# Patient Record
Sex: Female | Born: 1950 | Race: White | Hispanic: No | Marital: Single | Smoking: Current every day smoker
Health system: Southern US, Community
[De-identification: ages and names within clinical notes are randomized; demographics above are authoritative.]

## PROBLEM LIST (undated history)

## (undated) DIAGNOSIS — I10 Essential (primary) hypertension: Secondary | ICD-10-CM

## (undated) DIAGNOSIS — Z923 Personal history of irradiation: Secondary | ICD-10-CM

## (undated) DIAGNOSIS — M1990 Unspecified osteoarthritis, unspecified site: Secondary | ICD-10-CM

## (undated) HISTORY — PX: HEART CHAMBER REVISION: SHX267

## (undated) HISTORY — DX: Personal history of irradiation: Z92.3

---

## 2014-05-18 ENCOUNTER — Encounter (HOSPITAL_COMMUNITY): Payer: Self-pay | Admitting: Emergency Medicine

## 2014-05-18 ENCOUNTER — Emergency Department (HOSPITAL_COMMUNITY)
Admission: EM | Admit: 2014-05-18 | Discharge: 2014-05-18 | Disposition: A | Discharge status: Home / Self Care | Payer: Worker's Compensation | Attending: Emergency Medicine | Admitting: Emergency Medicine

## 2014-05-18 DIAGNOSIS — Y990 Civilian activity done for income or pay: Secondary | ICD-10-CM | POA: Insufficient documentation

## 2014-05-18 DIAGNOSIS — Y9289 Other specified places as the place of occurrence of the external cause: Secondary | ICD-10-CM | POA: Insufficient documentation

## 2014-05-18 DIAGNOSIS — S0083XA Contusion of other part of head, initial encounter: Secondary | ICD-10-CM | POA: Insufficient documentation

## 2014-05-18 DIAGNOSIS — S0990XA Unspecified injury of head, initial encounter: Secondary | ICD-10-CM | POA: Diagnosis present

## 2014-05-18 DIAGNOSIS — Z720 Tobacco use: Secondary | ICD-10-CM | POA: Insufficient documentation

## 2014-05-18 DIAGNOSIS — W228XXA Striking against or struck by other objects, initial encounter: Secondary | ICD-10-CM | POA: Diagnosis not present

## 2014-05-18 DIAGNOSIS — Y9389 Activity, other specified: Secondary | ICD-10-CM | POA: Diagnosis not present

## 2014-05-18 NOTE — ED Provider Notes (Signed)
CSN: [id number]     Arrival date & time 05/18/14  1239 History  This chart was scribed for non-physician practitioner, Domenic Moras, PA-C working with Ezequiel Essex, MD by Frederich Balding, ED scribe. This patient was seen in room TR06C/TR06C and the patient's care was started at 1:24 PM.   Chief Complaint  Patient presents with  . Head Injury   The history is provided by the patient. No language interpreter was used.    HPI Comments: Cheryl Singh is a 63 y.o. female who presents to the Emergency Department complaining of head injury that occurred today around 10 AM. States she was working with fences when a piece hit her forehead. Denies LOC or fall. Reports sudden onset pain at the time that is now resolved. Denies nausea, neck pain, dizziness, confusion. Pt is not currently on blood thinners.    History reviewed. No pertinent past medical history. History reviewed. No pertinent past surgical history. History reviewed. No pertinent family history. History  Substance Use Topics  . Smoking status: Current Every Day Smoker  . Smokeless tobacco: Not on file  . Alcohol Use: Yes     Comment: occ   OB History    No data available     Review of Systems  Gastrointestinal: Negative for nausea.  Musculoskeletal: Negative for neck pain.  Neurological: Negative for dizziness and syncope.  Psychiatric/Behavioral: Negative for confusion.  All other systems reviewed and are negative.  Allergies  Review of patient's allergies indicates no known allergies.  Home Medications   Prior to Admission medications   Not on File   BP 170/89 mmHg  Pulse 100  Temp(Src) 98.2 F (36.8 C) (Oral)  Resp 20  Ht 5' 6.5" (1.689 m)  Wt 160 lb (72.576 kg)  BMI 25.44 kg/m2  SpO2 97%   Physical Exam  Constitutional: She is oriented to person, place, and time. She appears well-developed and well-nourished. No distress.  HENT:  Head: Normocephalic and atraumatic.  Right Ear: Tympanic membrane and  ear canal normal. No hemotympanum.  Left Ear: Tympanic membrane and ear canal normal.  Right forehead with a excised hematoma noted. Minimal tenderness. No crepitus. Right TM with no hemotympanum. Left TM with cerumen impaction but no obvious bleeding.   Eyes: Conjunctivae and EOM are normal.  Neck: Neck supple. No tracheal deviation present.  No cervical spine tenderness  Cardiovascular: Normal rate.   Pulmonary/Chest: Effort normal. No respiratory distress.  Musculoskeletal: Normal range of motion.  Neurological: She is alert and oriented to person, place, and time. GCS eye subscore is 4. GCS verbal subscore is 5. GCS motor subscore is 6.  Neurologic exam:  Speech clear, pupils equal round reactive to light, extraocular movements intact  Normal peripheral visual fields Cranial nerves III through XII normal including no facial droop Follows commands, moves all extremities x4, normal strength to bilateral upper and lower extremities at all major muscle groups including grip Sensation normal to light touch  Coordination intact, no limb ataxia, finger-nose-finger normal Rapid alternating movements normal No pronator drift Gait normal   Skin: Skin is warm and dry.  Psychiatric: She has a normal mood and affect. Her behavior is normal.  Nursing note and vitals reviewed.   ED Course  Procedures (including critical care time)  DIAGNOSTIC STUDIES: Oxygen Saturation is 97% on RA, normal by my interpretation.    COORDINATION OF CARE: 1:29 PM-Head CT offered and pt declined. Discussed treatment plan which includes ice and ibuprofen or tylenol for pain with  pt at bedside and pt agreed to plan. Return precautions given. Pt currently not on anticoagulant.  Pt mentating appropriately, no acute distress  Labs Review Labs Reviewed - No data to display  Imaging Review No results found.   EKG Interpretation None      MDM   Final diagnoses:  Forehead contusion, initial encounter     BP 170/89 mmHg  Pulse 100  Temp(Src) 98.2 F (36.8 C) (Oral)  Resp 20  Ht 5' 6.5" (1.689 m)  Wt 160 lb (72.576 kg)  BMI 25.44 kg/m2  SpO2 97%   I personally performed the services described in this documentation, which was scribed in my presence. The recorded information has been reviewed and is accurate.  Domenic Moras, PA-C 05/18/14 [place], MD 05/18/14 [phone]

## 2014-05-18 NOTE — Discharge Instructions (Signed)
Please apply cool compress to your forehead for 15-20 min each several times daily.  Take ibuprofen or tylenol for pain.  Promptly return if you develop severe headache, numbness, vision changes, weakness, confusion or if you have other concerns.     Contusion A contusion is the result of an injury to the skin and underlying tissues and is usually caused by direct trauma. The injury results in the appearance of a bruise on the skin overlying the injured tissues. Contusions cause rupture and bleeding of the small capillaries and blood vessels and affect function, because the bleeding infiltrates muscles, tendons, nerves, or other soft tissues.  SYMPTOMS   Swelling and often a hard lump in the injured area, either superficial or deep.  Pain and tenderness over the area of the contusion.  Feeling of firmness when pressure is exerted over the contusion.  Discoloration under the skin, beginning with redness and progressing to the characteristic "black and blue" bruise. CAUSES  A contusion is typically the result of direct trauma. This is often by a blunt object.  RISK INCREASES WITH:  Sports that have a high likelihood of trauma (football, boxing, ice hockey, soccer, field hockey, martial arts, basketball, and baseball).  Sports that make falling from a height likely (high-jumping, pole-vaulting, skating, or gymnastics).  Any bleeding disorder (hemophilia) or taking medications that affect clotting (aspirin, nonsteroidal anti-inflammatory medications, or warfarin [Coumadin]).  Inadequate protection of exposed areas during contact sports. PREVENTION  Maintain physical fitness:  Joint and muscle flexibility.  Strength and endurance.  Coordination.  Wear proper protective equipment. Make sure it fits correctly. PROGNOSIS  Contusions typically heal without any complications. Healing time varies with the severity of injury and intake of medications that affect clotting. Contusions usually  heal in 1 to 4 weeks. RELATED COMPLICATIONS   Damage to nearby nerves or blood vessels, causing numbness, coldness, or paleness.  Compartment syndrome.  Bleeding into the soft tissues that leads to disability.  Infiltrative-type bleeding, leading to the calcification and impaired function of the injured muscle (rare).  Prolonged healing time if usual activities are resumed too soon.  Infection if the skin over the injury site is broken.  Fracture of the bone underlying the contusion.  Stiffness in the joint where the injured muscle crosses. TREATMENT  Treatment initially consists of resting the injured area as well as medication and ice to reduce inflammation. The use of a compression bandage may also be helpful in minimizing inflammation. As pain diminishes and movement is tolerated, the joint where the affected muscle crosses should be moved to prevent stiffness and the shortening (contracture) of the joint. Movement of the joint should begin as soon as possible. It is also important to work on maintaining strength within the affected muscles. Occasionally, extra padding over the area of contusion may be recommended before returning to sports, particularly if re-injury is likely.  MEDICATION   If pain relief is necessary these medications are often recommended:  Nonsteroidal anti-inflammatory medications, such as aspirin and ibuprofen.  Other minor pain relievers, such as acetaminophen, are often recommended.  Prescription pain relievers may be given by your caregiver. Use only as directed and only as much as you need. HEAT AND COLD  Cold treatment (icing) relieves pain and reduces inflammation. Cold treatment should be applied for 10 to 15 minutes every 2 to 3 hours for inflammation and pain and immediately after any activity that aggravates your symptoms. Use ice packs or an ice massage. (To do an ice massage fill  a large styrofoam cup with water and freeze. Tear a small amount of  foam from the top so ice protrudes. Massage ice firmly over the injured area in a circle about the size of a softball.)  Heat treatment may be used prior to performing the stretching and strengthening activities prescribed by your caregiver, physical therapist, or athletic trainer. Use a heat pack or a warm soak. SEEK MEDICAL CARE IF:   Symptoms get worse or do not improve despite treatment in a few days.  You have difficulty moving a joint.  Any extremity becomes extremely painful, numb, pale, or cool (This is an emergency!).  Medication produces any side effects (bleeding, upset stomach, or allergic reaction).  Signs of infection (drainage from skin, headache, muscle aches, dizziness, fever, or general ill feeling) occur if skin was broken. Document Released: 06/17/2005 Document Revised: 09/09/2011 Document Reviewed: 09/29/2008 [facility] Patient Information 2015 [place], [place]. This information is not intended to replace advice given to you by your health care provider. Make sure you discuss any questions you have with your health care provider.

## 2014-05-18 NOTE — ED Notes (Signed)
Pt sts hit with something while at work today in head; bruising noted; pt denies pain or LOC

## 2019-08-10 ENCOUNTER — Ambulatory Visit: Payer: Medicare PPO | Attending: Internal Medicine

## 2019-08-10 DIAGNOSIS — Z23 Encounter for immunization: Secondary | ICD-10-CM

## 2019-08-10 NOTE — Progress Notes (Signed)
   Covid-19 Vaccination Clinic  Name:  ANELIZ ESSLINGER    MRN: [id number] DOB: [birth year]  08/10/2019  Ms. Wonders was observed post Covid-19 immunization for 15 minutes without incidence. She was provided with Vaccine Information Sheet and instruction to access the V-Safe system.   Ms. Mastrogiovanni was instructed to call 911 with any severe reactions post vaccine: Marland Kitchen Difficulty breathing  . Swelling of your face and throat  . A fast heartbeat  . A bad rash all over your body  . Dizziness and weakness    Immunizations Administered    Name Date Dose VIS Date Route   Pfizer COVID-19 Vaccine 08/10/2019  2:43 PM 0.3 mL 06/11/2019 Intramuscular   Manufacturer: [place]   Lot: [id number]   [place]: [id number]

## 2019-08-12 ENCOUNTER — Ambulatory Visit: Payer: Self-pay

## 2019-09-04 ENCOUNTER — Ambulatory Visit: Payer: Medicare PPO | Attending: Internal Medicine

## 2019-09-04 DIAGNOSIS — Z23 Encounter for immunization: Secondary | ICD-10-CM | POA: Insufficient documentation

## 2019-09-04 NOTE — Progress Notes (Signed)
   Covid-19 Vaccination Clinic  Name:  JANAZIA TITTERINGTON    MRN: [id number] DOB: [birth year]  09/04/2019  Ms. Borras was observed post Covid-19 immunization for 15 minutes without incident. She was provided with Vaccine Information Sheet and instruction to access the V-Safe system.   Ms. Blough was instructed to call 911 with any severe reactions post vaccine: Marland Kitchen Difficulty breathing  . Swelling of face and throat  . A fast heartbeat  . A bad rash all over body  . Dizziness and weakness   Immunizations Administered    Name Date Dose VIS Date Route   Pfizer COVID-19 Vaccine 09/04/2019  2:35 PM 0.3 mL 06/11/2019 Intramuscular   Manufacturer: [place]   Lot: [id number]   [place]: [id number]

## 2020-09-26 DIAGNOSIS — Z01812 Encounter for preprocedural laboratory examination: Secondary | ICD-10-CM | POA: Diagnosis not present

## 2020-09-28 DIAGNOSIS — K5730 Diverticulosis of large intestine without perforation or abscess without bleeding: Secondary | ICD-10-CM | POA: Diagnosis not present

## 2020-09-28 DIAGNOSIS — K6389 Other specified diseases of intestine: Secondary | ICD-10-CM | POA: Diagnosis not present

## 2020-09-28 DIAGNOSIS — Z86010 Personal history of colonic polyps: Secondary | ICD-10-CM | POA: Diagnosis not present

## 2020-09-28 DIAGNOSIS — K640 First degree hemorrhoids: Secondary | ICD-10-CM | POA: Diagnosis not present

## 2020-11-21 DIAGNOSIS — U071 COVID-19: Secondary | ICD-10-CM | POA: Diagnosis not present

## 2020-11-21 DIAGNOSIS — R059 Cough, unspecified: Secondary | ICD-10-CM | POA: Diagnosis not present

## 2021-05-22 DIAGNOSIS — Z23 Encounter for immunization: Secondary | ICD-10-CM | POA: Diagnosis not present

## 2021-05-22 DIAGNOSIS — I10 Essential (primary) hypertension: Secondary | ICD-10-CM | POA: Diagnosis not present

## 2021-05-22 DIAGNOSIS — R310 Gross hematuria: Secondary | ICD-10-CM | POA: Diagnosis not present

## 2021-05-22 DIAGNOSIS — D7589 Other specified diseases of blood and blood-forming organs: Secondary | ICD-10-CM | POA: Diagnosis not present

## 2021-06-13 DIAGNOSIS — R319 Hematuria, unspecified: Secondary | ICD-10-CM | POA: Diagnosis not present

## 2021-06-13 DIAGNOSIS — I10 Essential (primary) hypertension: Secondary | ICD-10-CM | POA: Diagnosis not present

## 2021-06-15 ENCOUNTER — Other Ambulatory Visit: Payer: Self-pay | Admitting: Family

## 2021-06-15 ENCOUNTER — Other Ambulatory Visit: Payer: Self-pay

## 2021-06-15 ENCOUNTER — Encounter: Payer: Self-pay | Admitting: Family

## 2021-06-15 ENCOUNTER — Inpatient Hospital Stay: Payer: Medicare PPO | Attending: Family Medicine

## 2021-06-15 ENCOUNTER — Inpatient Hospital Stay: Payer: Medicare PPO | Admitting: Family

## 2021-06-15 VITALS — BP 151/86 | HR 95 | Temp 99.0°F | Resp 20 | Ht 64.0 in | Wt 170.0 lb

## 2021-06-15 DIAGNOSIS — R718 Other abnormality of red blood cells: Secondary | ICD-10-CM

## 2021-06-15 DIAGNOSIS — D7589 Other specified diseases of blood and blood-forming organs: Secondary | ICD-10-CM | POA: Insufficient documentation

## 2021-06-15 DIAGNOSIS — R319 Hematuria, unspecified: Secondary | ICD-10-CM | POA: Insufficient documentation

## 2021-06-15 DIAGNOSIS — F17210 Nicotine dependence, cigarettes, uncomplicated: Secondary | ICD-10-CM

## 2021-06-15 LAB — CBC WITH DIFFERENTIAL (CANCER CENTER ONLY)
Abs Immature Granulocytes: 0.07 10*3/uL (ref 0.00–0.07)
Basophils Absolute: 0.1 10*3/uL (ref 0.0–0.1)
Basophils Relative: 1 %
Eosinophils Absolute: 0.1 10*3/uL (ref 0.0–0.5)
Eosinophils Relative: 1 %
HCT: 36.6 % (ref 36.0–46.0)
Hemoglobin: 12.3 g/dL (ref 12.0–15.0)
Immature Granulocytes: 1 %
Lymphocytes Relative: 13 %
Lymphs Abs: 0.9 10*3/uL (ref 0.7–4.0)
MCH: 35.9 pg — ABNORMAL HIGH (ref 26.0–34.0)
MCHC: 33.6 g/dL (ref 30.0–36.0)
MCV: 106.7 fL — ABNORMAL HIGH (ref 80.0–100.0)
Monocytes Absolute: 0.9 10*3/uL (ref 0.1–1.0)
Monocytes Relative: 13 %
Neutro Abs: 4.9 10*3/uL (ref 1.7–7.7)
Neutrophils Relative %: 71 %
Platelet Count: 397 10*3/uL (ref 150–400)
RBC: 3.43 MIL/uL — ABNORMAL LOW (ref 3.87–5.11)
RDW: 12.2 % (ref 11.5–15.5)
WBC Count: 6.9 10*3/uL (ref 4.0–10.5)
nRBC: 0 % (ref 0.0–0.2)

## 2021-06-15 LAB — CMP (CANCER CENTER ONLY)
ALT: 20 U/L (ref 0–44)
AST: 22 U/L (ref 15–41)
Albumin: 3.8 g/dL (ref 3.5–5.0)
Alkaline Phosphatase: 112 U/L (ref 38–126)
Anion gap: 6 (ref 5–15)
BUN: 6 mg/dL — ABNORMAL LOW (ref 8–23)
CO2: 30 mmol/L (ref 22–32)
Calcium: 9.5 mg/dL (ref 8.9–10.3)
Chloride: 97 mmol/L — ABNORMAL LOW (ref 98–111)
Creatinine: 0.69 mg/dL (ref 0.44–1.00)
GFR, Estimated: >60 mL/min (ref >60)
Glucose, Bld: 111 mg/dL — ABNORMAL HIGH (ref 70–99)
Potassium: 4.1 mmol/L (ref 3.5–5.1)
Sodium: 133 mmol/L — ABNORMAL LOW (ref 135–145)
Total Bilirubin: 0.4 mg/dL (ref 0.3–1.2)
Total Protein: 6.5 g/dL (ref 6.5–8.1)

## 2021-06-15 LAB — FOLATE: Folate: 5.8 ng/mL — ABNORMAL LOW (ref >5.9)

## 2021-06-15 LAB — RETICULOCYTES
Immature Retic Fract: 12.3 % (ref 2.3–15.9)
RBC.: 3.43 MIL/uL — ABNORMAL LOW (ref 3.87–5.11)
Retic Count, Absolute: 70 10*3/uL (ref 19.0–186.0)
Retic Ct Pct: 2 % (ref 0.4–3.1)

## 2021-06-15 LAB — VITAMIN B12: Vitamin B-12: 1489 pg/mL — ABNORMAL HIGH (ref 180–914)

## 2021-06-15 LAB — SAVE SMEAR(SSMR), FOR PROVIDER SLIDE REVIEW

## 2021-06-15 LAB — LACTATE DEHYDROGENASE: LDH: 129 U/L (ref 98–192)

## 2021-06-15 NOTE — Progress Notes (Signed)
Hematology/Oncology Consultation   Name: Cheryl Singh      MRN: [id number]    Location: Room/bed info not found  Date: 06/15/2021 Time:3:19 PM   REFERRING PHYSICIAN: Anastasia Pall. Lindell Noe, MD  REASON FOR CONSULT:  Other abnormality of red blood cells   DIAGNOSIS: Macrocytosis without anemia  HISTORY OF PRESENT ILLNESS:  Cheryl Singh is a very pleasant 70 yo caucasian female with macrocytosis without anemia.  Hgb is 12.3, MCV 106, WBC count 6.9 and platelet 397.  She notes some fatigue at times.  She is currently being treated with an antibiotic for a UTI by her PCP. She states that she is still having a little burning and blood in her urine.  She denies problems with frequent or recurrent infections.  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  No history of pregnancy pr miscarriage.  No history of diabetes or thyroid disease.  No personal or known familial history of cancer.  She had her colonoscopy earlier this year in April. This showed internal hemorrhoids and erythematous mucosa in the sigmoid colon. She is due again in 7 years.  She is over due for mammogram and we do advise that she follow-up on this.  No fever, chills, n/v, cough, rash, SOB, chest pain, palpitations, abdominal pain or changes in bowel habits.  She has history of IBS with some abdominal bloating.  She has occasional lightheadedness.  No falls or syncope to report.  No swelling, tenderness, numbness or tingling in her extremities.  She smokes 1/2-1 ppd. No recreational drug use.  She has 2-3 glasses of wine 2-3 times a week.  She has maintained a good appetite but admits that she needs to hydrate better throughout the day.  Her weight is described as stable.  She works out 3 days a week.  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   ROS: All other 10 point review of systems is negative.   PAST MEDICAL HISTORY:   No past medical history on  file.  ALLERGIES: No Known Allergies    MEDICATIONS:  Current Outpatient Medications on File Prior to Visit  Medication Sig Dispense Refill   atorvastatin (LIPITOR) 10 MG tablet 1 tablet     Cholecalciferol (VITAMIN D3) 50 MCG (2000 UT) capsule 1 capsule     Cyanocobalamin (VITAMIN B12) 1000 MCG TBCR 2 capsules     losartan (COZAAR) 50 MG tablet 1 tablet     Multiple Vitamins-Minerals (MULTI FOR HER 50+) TABS See admin instructions.     Omega-3 Fatty Acids (FISH OIL) 1000 MG CAPS 1 capsule     Probiotic CHEW 1 tablet     No current facility-administered medications on file prior to visit.     PAST SURGICAL HISTORY No past surgical history on file.  FAMILY HISTORY: No family history on file.  SOCIAL HISTORY:  reports that she has been smoking. She does not have any smokeless tobacco history on file. She reports current alcohol use. She reports that she does not use drugs.  PERFORMANCE STATUS: The patient's performance status is 1 - Symptomatic but completely ambulatory  PHYSICAL EXAM: Most Recent Vital Signs: Blood pressure (!) 151/86, pulse 95, temperature 99 F (37.2 C), temperature source Oral, resp. rate 20, height 5\' 4"  (1.626 m), weight 170 lb (77.1 kg). BP (!) 151/86 (BP Location: Right Arm, Patient Position: Sitting)    Pulse 95    Temp 99 F (37.2 C) (Oral)  Resp 20    Ht 5\' 4"  (1.626 m)    Wt 170 lb (77.1 kg)    BMI 29.18 kg/m   General Appearance:    Alert, cooperative, no distress, appears stated age  Head:    Normocephalic, without obvious abnormality, atraumatic  Eyes:    PERRL, conjunctiva/corneas clear, EOM's intact, fundi    benign, both eyes        Throat:   Lips, mucosa, and tongue normal; teeth and gums normal  Neck:   Supple, symmetrical, trachea midline, no adenopathy;    thyroid:  no enlargement/tenderness/nodules; no carotid   bruit or JVD  Back:     Symmetric, no curvature, ROM normal, no CVA tenderness  Lungs:     Clear to auscultation  bilaterally, respirations unlabored  Chest Wall:    No tenderness or deformity   Heart:    Regular rate and rhythm, S1 and S2 normal, no murmur, rub   or gallop     Abdomen:     Soft, non-tender, bowel sounds active all four quadrants,    no masses, no organomegaly        Extremities:   Extremities normal, atraumatic, no cyanosis or edema  Pulses:   2+ and symmetric all extremities  Skin:   Skin color, texture, turgor normal, no rashes or lesions  Lymph nodes:   Cervical, supraclavicular, and axillary nodes normal  Neurologic:   CNII-XII intact, normal strength, sensation and reflexes    throughout    LABORATORY DATA:  Results for orders placed or performed in visit on 06/15/21 (from the past 48 hour(s))  CBC with Differential (Cancer Center Only)     Status: Abnormal   Collection Time: 06/15/21  2:30 PM  Result Value Ref Range   WBC Count 6.9 4.0 - 10.5 K/uL   RBC 3.43 (L) 3.87 - 5.11 MIL/uL   Hemoglobin 12.3 12.0 - 15.0 g/dL   HCT 36.6 36.0 - 46.0 %   MCV 106.7 (H) 80.0 - 100.0 fL   MCH 35.9 (H) 26.0 - 34.0 pg   MCHC 33.6 30.0 - 36.0 g/dL   RDW 12.2 11.5 - 15.5 %   Platelet Count 397 150 - 400 K/uL   nRBC 0.0 0.0 - 0.2 %   Neutrophils Relative % 71 %   Neutro Abs 4.9 1.7 - 7.7 K/uL   Lymphocytes Relative 13 %   Lymphs Abs 0.9 0.7 - 4.0 K/uL   Monocytes Relative 13 %   Monocytes Absolute 0.9 0.1 - 1.0 K/uL   Eosinophils Relative 1 %   Eosinophils Absolute 0.1 0.0 - 0.5 K/uL   Basophils Relative 1 %   Basophils Absolute 0.1 0.0 - 0.1 K/uL   Immature Granulocytes 1 %   Abs Immature Granulocytes 0.07 0.00 - 0.07 K/uL    Comment: Performed at [facility] at [facility], [address] [place], [place] [address]  CMP ([place] only)     Status: Abnormal   Collection Time: 06/15/21  2:30 PM  Result Value Ref Range   Sodium 133 (L) 135 - 145 mmol/L   Potassium 4.1 3.5 - 5.1 mmol/L   Chloride 97 (L) 98 - 111 mmol/L   CO2 30 22 - 32 mmol/L    Glucose, Bld 111 (H) 70 - 99 mg/dL    Comment: Glucose reference range applies only to samples taken after fasting for at least 8 hours.   BUN 6 (L) 8 - 23 mg/dL  Creatinine 0.69 0.44 - 1.00 mg/dL   Calcium 9.5 8.9 - 10.3 mg/dL   Total Protein 6.5 6.5 - 8.1 g/dL   Albumin 3.8 3.5 - 5.0 g/dL   AST 22 15 - 41 U/L   ALT 20 0 - 44 U/L   Alkaline Phosphatase 112 38 - 126 U/L   Total Bilirubin 0.4 0.3 - 1.2 mg/dL   GFR, Estimated >60 >60 mL/min    Comment: (NOTE) Calculated using the CKD-EPI Creatinine Equation (2021)    Anion gap 6 5 - 15    Comment: Performed at [facility] at [facility], [address] [place], [place] [address]  Save Smear [facility])     Status: None   Collection Time: 06/15/21  2:30 PM  Result Value Ref Range   Smear Review SMEAR STAINED AND AVAILABLE FOR REVIEW     Comment: Performed at [facility] at [facility], [address], [place], [place] [address]  Reticulocytes     Status: Abnormal   Collection Time: 06/15/21  2:31 PM  Result Value Ref Range   Retic Ct Pct 2.0 0.4 - 3.1 %   RBC. 3.43 (L) 3.87 - 5.11 MIL/uL   Retic Count, Absolute 70.0 19.0 - 186.0 K/uL   Immature Retic Fract 12.3 2.3 - 15.9 %    Comment: Performed at [facility] at [facility], [address], [place], [place] [address]      RADIOGRAPHY: No results found.     PATHOLOGY: None  ASSESSMENT/PLAN: Ms. 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  Lab work up [employer]. Will review blood smear when available.  Once results are available we will contact patient to go over and schedule follow-up if needed.   All questions were answered. The patient knows to call the clinic with any problems, questions or concerns. We can certainly see the patient much sooner if necessary.  The patient was discussed with Dr. Marin Olp and he is in agreement with the aforementioned.    Lottie Dawson, NP

## 2021-06-19 ENCOUNTER — Telehealth: Payer: Self-pay | Admitting: *Deleted

## 2021-06-19 NOTE — Telephone Encounter (Signed)
No 06/15/21 LOS

## 2021-06-20 ENCOUNTER — Telehealth: Payer: Self-pay | Admitting: *Deleted

## 2021-06-20 NOTE — Telephone Encounter (Signed)
Per entered late by Judson Roch 06/15/21 los called and was unable to lvm of upcoming appointments- mailed calendar

## 2021-06-22 ENCOUNTER — Encounter: Payer: Self-pay | Admitting: *Deleted

## 2021-06-28 DIAGNOSIS — R300 Dysuria: Secondary | ICD-10-CM | POA: Diagnosis not present

## 2021-07-11 ENCOUNTER — Other Ambulatory Visit: Payer: Self-pay | Admitting: Urology

## 2021-07-11 DIAGNOSIS — N3000 Acute cystitis without hematuria: Secondary | ICD-10-CM | POA: Diagnosis not present

## 2021-07-11 DIAGNOSIS — R310 Gross hematuria: Secondary | ICD-10-CM | POA: Diagnosis not present

## 2021-07-11 DIAGNOSIS — D414 Neoplasm of uncertain behavior of bladder: Secondary | ICD-10-CM | POA: Diagnosis not present

## 2021-07-12 NOTE — Patient Instructions (Addendum)
DUE TO COVID-19 ONLY ONE VISITOR IS ALLOWED TO COME WITH YOU AND STAY IN THE WAITING ROOM ONLY DURING PRE OP AND PROCEDURE.   **NO VISITORS ARE ALLOWED IN THE SHORT STAY AREA OR RECOVERY ROOM!!**  IF YOU WILL BE ADMITTED INTO THE HOSPITAL YOU ARE ALLOWED ONLY TWO SUPPORT PEOPLE DURING VISITATION HOURS ONLY (7 AM -8PM)   The support person(s) must pass our screening, gel in and out, and wear a mask at all times, including in the patients room. Patients must also wear a mask when staff or their support person are in the room. Visitors GUEST BADGE MUST BE WORN VISIBLY  One adult visitor may remain with you overnight and MUST be in the room by 8 P.M.  No visitors under the age of 70. Any visitor under the age of 98 must be accompanied by an adult.    COVID SWAB TESTING MUST BE COMPLETED ON:  07/13/21 @ 8:45 AM   Site: [facility] [place] [place] Enter: Main Entrance have a seat in the waiting area to the right of main entrance (DO NOT [place]!!!!!) Dial: [phone] to alert staff you have arrived  You are not required to quarantine, however you are required to wear a well-fitted mask when you are out and around people not in your household.  Hand Hygiene often Do NOT share personal items Notify your provider if you are in close contact with someone who has COVID or you develop fever 100.4 or greater, new onset of sneezing, cough, sore throat, shortness of breath or body aches.  [place], [address], must go inside of the hospital, NOT A DRIVE THRU!  (Must self quarantine after testing. Follow instructions on handout.)       Your procedure is scheduled on: 07/17/21    Report to [facility] Main Entrance    Report to short stay at : 5: 15 AM   Call this number if you have problems the morning of surgery [phone]   Do not eat food :After Midnight.   May have liquids  until : 4:30 AM   day of surgery  CLEAR LIQUID DIET  Foods Allowed                                                                     Foods Excluded  Water, Black Coffee and tea, regular and decaf                             liquids that you cannot  Plain Jell-O in any flavor  (No red)                                           see through such as: Fruit ices (not with fruit pulp)                                     milk, soups, orange juice  Iced Popsicles (No red)                                    All solid food                                   Apple juices Sports drinks like Gatorade (No red) Lightly seasoned clear broth or consume(fat free) Sugar  Sample Menu Breakfast                                Lunch                                     Supper Cranberry juice                    Beef broth                            Chicken broth Jell-O                                     Grape juice                           Apple juice Coffee or tea                        Jell-O                                      Popsicle                                                Coffee or tea                        Coffee or tea     Oral Hygiene is also important to reduce your risk of infection.                                    Remember - BRUSH YOUR TEETH THE MORNING OF SURGERY WITH YOUR REGULAR TOOTHPASTE   Do NOT smoke after Midnight   Take these medicines the morning of surgery with A SIP OF WATER: Cipro  DO NOT TAKE ANY ORAL DIABETIC MEDICATIONS DAY OF YOUR SURGERY                              You may not have any metal on your body including hair pins, jewelry, and body piercing             Do not wear make-up, lotions, powders, perfumes/cologne, or deodorant  Do not wear nail polish including gel and S&S, artificial/acrylic nails,  or any other type of covering on natural nails including finger and toenails. If you have artificial nails, gel coating, etc. that needs to be  removed by a nail salon please have this removed prior to surgery or surgery may need to be canceled/ delayed if the surgeon/ anesthesia feels like they are unable to be safely monitored.   Do not shave  48 hours prior to surgery.    Do not bring valuables to the hospital. [place].   Contacts, dentures or bridgework may not be worn into surgery.   Bring small overnight bag day of surgery.   Special Instructions: Bring a copy of your healthcare power of attorney and living will documents         the day of surgery if you haven't scanned them before.              Please read over the following fact sheets you were given: IF YOU HAVE QUESTIONS ABOUT YOUR PRE-OP INSTRUCTIONS PLEASE CALL [phone]     [facility] - Preparing for Surgery Before surgery, you can play an important role.  Because skin is not sterile, your skin needs to be as free of germs as possible.  You can reduce the number of germs on your skin by washing with CHG (chlorahexidine gluconate) soap before surgery.  CHG is an antiseptic cleaner which kills germs and bonds with the skin to continue killing germs even after washing. Please DO NOT use if you have an allergy to CHG or antibacterial soaps.  If your skin becomes reddened/irritated stop using the CHG and inform your nurse when you arrive at Short Stay. Do not shave (including legs and underarms) for at least 48 hours prior to the first CHG shower.  You may shave your face/neck. Please follow these instructions carefully:  1.  Shower with CHG Soap the night before surgery and the  morning of Surgery.  2.  If you choose to wash your hair, wash your hair first as usual with your  normal  shampoo.  3.  After you shampoo, rinse your hair and body thoroughly to remove the  shampoo.                           4.  Use CHG as you would any other liquid soap.  You can apply chg directly  to the skin and wash                        Gently with a scrungie or clean washcloth.  5.  Apply the CHG Soap to your body ONLY FROM THE NECK DOWN.   Do not use on face/ open                           Wound or open sores. Avoid contact with eyes, ears mouth and genitals (private parts).                       Wash face,  Genitals (private parts) with your normal soap.             6.  Wash thoroughly, paying special attention to the area where your surgery  will be performed.  7.  Thoroughly rinse your body with warm water from  the neck down.  8.  DO NOT shower/wash with your normal soap after using and rinsing off  the CHG Soap.                9.  Pat yourself dry with a clean towel.            10.  Wear clean pajamas.            11.  Place clean sheets on your bed the night of your first shower and do not  sleep with pets. Day of Surgery : Do not apply any lotions/deodorants the morning of surgery.  Please wear clean clothes to the hospital/surgery center.  FAILURE TO FOLLOW THESE INSTRUCTIONS MAY RESULT IN THE CANCELLATION OF YOUR SURGERY PATIENT SIGNATURE_________________________________  NURSE SIGNATURE__________________________________  ________________________________________________________________________

## 2021-07-12 NOTE — Progress Notes (Signed)
Pt. Need orders for upcoming surgery.

## 2021-07-13 ENCOUNTER — Other Ambulatory Visit: Payer: Self-pay

## 2021-07-13 ENCOUNTER — Encounter (HOSPITAL_COMMUNITY)
Admission: RE | Admit: 2021-07-13 | Discharge: 2021-07-13 | Disposition: A | Discharge status: Home / Self Care | Payer: Medicare PPO | Source: Ambulatory Visit | Attending: Urology | Admitting: Urology

## 2021-07-13 ENCOUNTER — Other Ambulatory Visit: Payer: Self-pay | Admitting: Urology

## 2021-07-13 ENCOUNTER — Encounter (HOSPITAL_COMMUNITY): Payer: Self-pay

## 2021-07-13 VITALS — BP 149/86 | HR 107 | Temp 98.4°F | Ht 64.5 in | Wt 168.0 lb

## 2021-07-13 DIAGNOSIS — Z20822 Contact with and (suspected) exposure to covid-19: Secondary | ICD-10-CM | POA: Diagnosis not present

## 2021-07-13 DIAGNOSIS — Z01812 Encounter for preprocedural laboratory examination: Secondary | ICD-10-CM | POA: Diagnosis not present

## 2021-07-13 DIAGNOSIS — Z01818 Encounter for other preprocedural examination: Secondary | ICD-10-CM | POA: Insufficient documentation

## 2021-07-13 DIAGNOSIS — I2510 Atherosclerotic heart disease of native coronary artery without angina pectoris: Secondary | ICD-10-CM | POA: Diagnosis not present

## 2021-07-13 HISTORY — DX: Essential (primary) hypertension: I10

## 2021-07-13 HISTORY — DX: Unspecified osteoarthritis, unspecified site: M19.90

## 2021-07-13 LAB — CBC
HCT: 36 % (ref 36.0–46.0)
Hemoglobin: 12.2 g/dL (ref 12.0–15.0)
MCH: 35.4 pg — ABNORMAL HIGH (ref 26.0–34.0)
MCHC: 33.9 g/dL (ref 30.0–36.0)
MCV: 104.3 fL — ABNORMAL HIGH (ref 80.0–100.0)
Platelets: 352 10*3/uL (ref 150–400)
RBC: 3.45 MIL/uL — ABNORMAL LOW (ref 3.87–5.11)
RDW: 12.6 % (ref 11.5–15.5)
WBC: 6.9 10*3/uL (ref 4.0–10.5)
nRBC: 0 % (ref 0.0–0.2)

## 2021-07-13 LAB — BASIC METABOLIC PANEL
Anion gap: 11 (ref 5–15)
BUN: <5 mg/dL — ABNORMAL LOW (ref 8–23)
CO2: 24 mmol/L (ref 22–32)
Calcium: 8.9 mg/dL (ref 8.9–10.3)
Chloride: 91 mmol/L — ABNORMAL LOW (ref 98–111)
Creatinine, Ser: 0.51 mg/dL (ref 0.44–1.00)
GFR, Estimated: >60 mL/min (ref >60)
Glucose, Bld: 102 mg/dL — ABNORMAL HIGH (ref 70–99)
Potassium: 4.2 mmol/L (ref 3.5–5.1)
Sodium: 126 mmol/L — ABNORMAL LOW (ref 135–145)

## 2021-07-13 LAB — SARS CORONAVIRUS 2 (TAT 6-24 HRS): SARS Coronavirus 2: NEGATIVE

## 2021-07-13 NOTE — Progress Notes (Signed)
COVID Vaccine Completed: Yes Date COVID Vaccine completed: 2022 x 3 COVID vaccine manufacturer: Davis Test: 07/13/21 PCP - Dr. Knox Royalty Cardiologist - NO  Chest x-ray -  EKG -  Stress Test -  ECHO -  Cardiac Cath -  Pacemaker/ICD device last checked:  Sleep Study -  CPAP -   Fasting Blood Sugar -  Checks Blood Sugar _____ times a day  Blood Thinner Instructions: Aspirin Instructions: Last Dose:  Anesthesia review: Hx: HTN  Patient denies shortness of breath, fever, cough and chest pain at PAT appointment   Patient verbalized understanding of instructions that were given to them at the PAT appointment. Patient was also instructed that they will need to review over the PAT instructions again at home before surgery.

## 2021-07-16 NOTE — H&P (Signed)
Patient is a 71 year old white female seen today for evaluation of recurrent UTI. Patient had history of ESBL E. coli UTI documented on voided urine culture 05/22/2021. She states she has had problems with affect infection over the last 2 months that has not really cleared completely even on several antibiotics. Currently she is symptomatic. She is having frequency dysuria and some gross hematuria. She denies any fever. No prior history of stones. She is a long-term smoker half pack per day for about 50 years. Patient is not currently on antibiotics.  Her voided urine today shows 40-60 RBCs 20-40 WBCs and positive nitrite. Cath urine will be sent for culture.  Cystoscopy was performed today due to the gross hematuria. Based on visualization appears to have a mass emanating from the left side of the bladder neck perhaps extending down onto the trigonal area very difficult to visualize but do think there is an irregular tissue mass in this area. Suggestive of urothelial tumor.     ALLERGIES: None   MEDICATIONS: Atorvastatin Calcium  Losartan Potassium     GU PSH: No GU PSH    NON-GU PSH: No Non-GU PSH        GU PMH: No GU PMH    NON-GU PMH: No Non-GU PMH    FAMILY HISTORY: No Family History    SOCIAL HISTORY: Marital Status: Unknown    REVIEW OF SYSTEMS:    GU Review Female:  Patient reports frequent urination, hard to postpone urination, and burning /pain with urination. Patient denies get up at night to urinate, leakage of urine, stream starts and stops, trouble starting your stream, have to strain to urinate, and being pregnant.   Gastrointestinal (Upper):  Patient denies nausea, vomiting, and indigestion/ heartburn.   Gastrointestinal (Lower):  Patient denies diarrhea and constipation.   Constitutional:  Patient denies fever, night sweats, weight loss, and fatigue.   Skin:  Patient denies skin rash/ lesion and itching.   Eyes:  Patient denies blurred vision and double vision.   Ears/  Nose/ Throat:  Patient denies sore throat and sinus problems.   Hematologic/Lymphatic:  Patient denies swollen glands and easy bruising.   Cardiovascular:  Patient denies leg swelling and chest pains.   Respiratory:  Patient denies cough and shortness of breath.   Endocrine:  Patient denies excessive thirst.   Musculoskeletal:  Patient denies back pain and joint pain.   Neurological:  Patient denies headaches and dizziness.   Psychologic:  Patient denies depression and anxiety.   Notes: hematuria    VITAL SIGNS: None   GU PHYSICAL EXAMINATION:    External Genitalia: No hirsutism, no rash, no scarring, no cyst, no erythematous lesion, no papular lesion, no blanched lesion, no warty lesion. No edema.   Urethral Meatus: Normal size. Normal position. No discharge.   Urethra: No tenderness, no mass, no scarring. No hypermobility. No leakage.   MULTI-SYSTEM PHYSICAL EXAMINATION:    Constitutional: Well-nourished. No physical deformities. Normally developed. Good grooming.   Neck: Neck symmetrical, not swollen. Normal tracheal position.   Respiratory: No labored breathing, no use of accessory muscles.    Cardiovascular: Normal temperature, normal extremity pulses, no swelling, no varicosities.   Lymphatic: No enlargement of neck, axillae, groin.   Skin: No paleness, no jaundice, no cyanosis. No lesion, no ulcer, no rash.   Neurologic / Psychiatric: Oriented to time, oriented to place, oriented to person. No depression, no anxiety, no agitation.   Eyes: Normal conjunctivae. Normal eyelids.   Ears, Nose, Mouth, and  Throat: Left ear no scars, no lesions, no masses. Right ear no scars, no lesions, no masses. Nose no scars, no lesions, no masses. Normal hearing. Normal lips.   Musculoskeletal: Normal gait and station of head and neck.        PAST DATA REVIEW: None   PROCEDURES:    Flexible Cystoscopy - 52000  Risks, benefits, and some of the potential complications of the procedure were  discussed at length with the patient including infection, bleeding, voiding discomfort, urinary retention, fever, chills, sepsis, and others. All questions were answered. Informed consent was obtained. Antibiotic prophylaxis was given. Sterile technique and intraurethral analgesia were used.  Meatus:  Normal size. Normal location. Normal condition.  Urethra:  No hypermobility. No leakage.  Ureteral Orifices:  Normal location. Normal size. Normal shape. Effluxed clear urine.  Bladder:  Cystoscopy was performed today due to the gross hematuria. Based on visualization appears to have a mass emanating from the left side of the bladder neck perhaps extending down onto the trigonal area very difficult to visualize but do think there is an irregular tissue mass in this area. Suggestive of urothelial tumor..      The lower urinary tract was carefully examined. The procedure was well-tolerated and without complications. Antibiotic instructions were given. Instructions were given to call the office immediately for bloody urine, difficulty urinating, urinary retention, painful or frequent urination, fever, chills, nausea, vomiting or other illness. The patient stated that she understood these instructions and would comply with them.    PVR Ultrasound - 71696  Scanned Volume: 67 cc    Urinalysis w/Scope - 81001  Dipstick Dipstick Cont'd Micro  Color: Red Bilirubin: Neg mg/dL WBC/hpf: 20 - 40/hpf  Appearance: Turbid Ketones: Trace mg/dL RBC/hpf: 40 - 60/hpf  Specific Gravity: 1.025 Blood: 3+ ery/uL Bacteria: Many (>50/hpf)  pH: 6.5 Protein: 3+ mg/dL Cystals: NS (Not Seen)  Glucose: Neg mg/dL Urobilinogen: 0.2 mg/dL Casts: NS (Not Seen)   Nitrites: Positive Trichomonas: Not Present   Leukocyte Esterase: 3+ leu/uL Mucous: Not Present    Epithelial Cells: 0 - 5/hpf    Yeast: NS (Not Seen)    Sperm: Not Present   Notes: Unspun micro due to RBC concentration     ASSESSMENT:     ICD-10 Details  1 GU:   Acute Cystitis/UTI - V89.38 Acute, Complicated Injury  2  Gross hematuria - B01.7 Acute, Complicated Injury  3  Bladder tumor/neoplasm - D41.4 Acute, Complicated Injury   PLAN:   Medications  New Meds: Cipro 500 mg tablet 1 tablet PO BID #14 0 Refill(s)     Pharmacy Name:  [id number]    Address:  [address]     [place],  [address]    Phone:  [phone]    Fax:  [phone]   Orders  Labs Urine Culture CATH, BUN/Creatinine  X-Rays: C.T. Hematuria With and Without I.V. Contrast  Schedule  Return Visit/Planned Activity: 2 Weeks - Office Visit, Cystoscopy  Document  Letter(s):  Created for Patient: Clinical Summary   Notes:  Cath urine is sent for culture today. Initiate Cipro 500 mg twice daily until C&S results available. Based on cystoscopic findings will schedule for cystoscopy retrogrades and probable TURBT in the near future. Risks and benefits of that procedure were discussed as outlined below.  TURBT consent: I have discussed with the patient the risks, benefits of TURBT which include but are not limited to: Bleeding, infection, damage to the bladder with potential perforation of the  bladder, damage to surrounding organs, possible need for further procedures including open repair and catheterization, possibility of nonhealing area within the bladder, urgency, frequency which may be refractory to medications. I pointed out that in some occasions after resection of the bladder tumor, mitomycin-C chemotherapy may be instilled into the bladder. The risks associated with this therapy include but are not limited to: Refractory or new onset urgency, frequency, dysuria, infrequently severe systemic side effects secondary to mitomycin-C. After full discussion of the risks, benefits and alternatives, the patient has consented to the above procedure and desires to proceed.

## 2021-07-16 NOTE — Anesthesia Preprocedure Evaluation (Addendum)
Anesthesia Evaluation  Patient identified by MRN, date of birth, ID band Patient awake    Reviewed: Allergy & Precautions, NPO status , Patient's Chart, lab work & pertinent test results  Airway Mallampati: I  TM Distance: >3 FB Neck ROM: Full    Dental no notable dental hx.    Pulmonary Current Smoker and Patient abstained from smoking.,    Pulmonary exam normal breath sounds clear to auscultation       Cardiovascular hypertension, Pt. on medications Normal cardiovascular exam Rhythm:Regular Rate:Normal     Neuro/Psych negative neurological ROS  negative psych ROS   GI/Hepatic negative GI ROS, Neg liver ROS,   Endo/Other  negative endocrine ROS  Renal/GU Lab Results      Component                Value               Date                      CREATININE               0.51                07/13/2021                BUN                      <5 (L)              07/13/2021                NA                       126 (L)             07/13/2021                K                        4.2                 07/13/2021                CL                       91 (L)              07/13/2021                CO2                      24                  07/13/2021            Bladder dysfunction      Musculoskeletal  (+) Arthritis ,   Abdominal   Peds  Hematology Lab Results      Component                Value               Date                      WBC                      6.9  07/13/2021                HGB                      12.2                07/13/2021                HCT                      36.0                07/13/2021                        PLT                      352                 07/13/2021              Anesthesia Other Findings NKDA  Reproductive/Obstetrics                            Anesthesia Physical Anesthesia Plan  ASA: 3  Anesthesia Plan: General   Post-op Pain  Management:    Induction: Intravenous  PONV Risk Score and Plan: 3 and Treatment may vary due to age or medical condition, Midazolam and Ondansetron  Airway Management Planned: LMA  Additional Equipment: None  Intra-op Plan:   Post-operative Plan:   Informed Consent: I have reviewed the patients History and Physical, chart, labs and discussed the procedure including the risks, benefits and alternatives for the proposed anesthesia with the patient or authorized representative who has indicated his/her understanding and acceptance.     Dental advisory given  Plan Discussed with: CRNA and Anesthesiologist  Anesthesia Plan Comments:        Anesthesia Quick Evaluation

## 2021-07-17 ENCOUNTER — Other Ambulatory Visit: Payer: Self-pay

## 2021-07-17 ENCOUNTER — Ambulatory Visit (HOSPITAL_COMMUNITY): Payer: Medicare PPO | Admitting: Anesthesiology

## 2021-07-17 ENCOUNTER — Ambulatory Visit (HOSPITAL_COMMUNITY): Payer: Medicare PPO

## 2021-07-17 ENCOUNTER — Encounter (HOSPITAL_COMMUNITY)
Admission: RE | Disposition: A | Discharge status: Home / Self Care | Payer: Self-pay | Source: Home / Self Care | Attending: Urology

## 2021-07-17 ENCOUNTER — Observation Stay (HOSPITAL_COMMUNITY)
Admission: RE | Admit: 2021-07-17 | Discharge: 2021-07-18 | Disposition: A | Discharge status: Home / Self Care | Payer: Medicare PPO | Attending: Urology | Admitting: Urology

## 2021-07-17 ENCOUNTER — Encounter (HOSPITAL_COMMUNITY): Payer: Self-pay | Admitting: Urology

## 2021-07-17 DIAGNOSIS — Z79899 Other long term (current) drug therapy: Secondary | ICD-10-CM | POA: Diagnosis not present

## 2021-07-17 DIAGNOSIS — C679 Malignant neoplasm of bladder, unspecified: Secondary | ICD-10-CM | POA: Diagnosis not present

## 2021-07-17 DIAGNOSIS — I10 Essential (primary) hypertension: Secondary | ICD-10-CM | POA: Diagnosis not present

## 2021-07-17 DIAGNOSIS — N3289 Other specified disorders of bladder: Secondary | ICD-10-CM | POA: Diagnosis not present

## 2021-07-17 DIAGNOSIS — N3000 Acute cystitis without hematuria: Secondary | ICD-10-CM | POA: Diagnosis not present

## 2021-07-17 DIAGNOSIS — M1990 Unspecified osteoarthritis, unspecified site: Secondary | ICD-10-CM | POA: Diagnosis not present

## 2021-07-17 DIAGNOSIS — C678 Malignant neoplasm of overlapping sites of bladder: Secondary | ICD-10-CM | POA: Diagnosis not present

## 2021-07-17 HISTORY — PX: TRANSURETHRAL RESECTION OF BLADDER TUMOR: SHX2575

## 2021-07-17 LAB — TYPE AND SCREEN
ABO/RH(D): AB POS
Antibody Screen: NEGATIVE

## 2021-07-17 LAB — ABO/RH: ABO/RH(D): AB POS

## 2021-07-17 SURGERY — TURBT (TRANSURETHRAL RESECTION OF BLADDER TUMOR)
Anesthesia: General | Laterality: Bilateral

## 2021-07-17 MED ORDER — ONDANSETRON HCL 4 MG/2ML IJ SOLN: 4.0000 mg | INTRAMUSCULAR | Status: DC | PRN

## 2021-07-17 MED ORDER — SODIUM CHLORIDE 0.9 % IR SOLN
Status: DC | PRN
  Administered 2021-07-17 (×16): 3000 mL via INTRAVESICAL

## 2021-07-17 MED ORDER — SUCCINYLCHOLINE CHLORIDE 200 MG/10ML IV SOSY
PREFILLED_SYRINGE | INTRAVENOUS | Status: DC | PRN
  Administered 2021-07-17: 100 mg via INTRAVENOUS

## 2021-07-17 MED ORDER — BELLADONNA ALKALOIDS-OPIUM 16.2-60 MG RE SUPP: 1.0000 | Freq: Four times a day (QID) | RECTAL | Status: DC | PRN

## 2021-07-17 MED ORDER — ONDANSETRON HCL 4 MG/2ML IJ SOLN: 4.0000 mg | Freq: Once | INTRAMUSCULAR | Status: DC | PRN

## 2021-07-17 MED ORDER — FENTANYL CITRATE (PF) 100 MCG/2ML IJ SOLN
INTRAMUSCULAR | Status: DC | PRN
  Administered 2021-07-17: 65 ug via INTRAVENOUS
  Administered 2021-07-17: 35 ug via INTRAVENOUS

## 2021-07-17 MED ORDER — ACETAMINOPHEN 325 MG PO TABS: 650.0000 mg | ORAL_TABLET | ORAL | Status: DC | PRN

## 2021-07-17 MED ORDER — MENTHOL 3 MG MT LOZG
1.0000 | LOZENGE | OROMUCOSAL | Status: DC | PRN
  Administered 2021-07-18: 3 mg via ORAL
  Filled 2021-07-17: qty 9

## 2021-07-17 MED ORDER — CEFAZOLIN SODIUM-DEXTROSE 2-4 GM/100ML-% IV SOLN
2.0000 g | INTRAVENOUS | Status: AC
  Administered 2021-07-17: 2 g via INTRAVENOUS
  Filled 2021-07-17: qty 100

## 2021-07-17 MED ORDER — CHLORHEXIDINE GLUCONATE CLOTH 2 % EX PADS
6.0000 | MEDICATED_PAD | Freq: Every day | CUTANEOUS | Status: DC
  Administered 2021-07-18: 6 via TOPICAL

## 2021-07-17 MED ORDER — LIDOCAINE HCL (CARDIAC) PF 100 MG/5ML IV SOSY
PREFILLED_SYRINGE | INTRAVENOUS | Status: DC | PRN
Start: 2021-07-17 — End: 2021-07-17
  Administered 2021-07-17: 100 mg via INTRATRACHEAL

## 2021-07-17 MED ORDER — FENTANYL CITRATE (PF) 100 MCG/2ML IJ SOLN
INTRAMUSCULAR | Status: AC
  Filled 2021-07-17: qty 2

## 2021-07-17 MED ORDER — ACETAMINOPHEN 10 MG/ML IV SOLN: 1000.0000 mg | Freq: Once | INTRAVENOUS | Status: DC | PRN

## 2021-07-17 MED ORDER — OXYBUTYNIN CHLORIDE 5 MG PO TABS
5.0000 mg | ORAL_TABLET | Freq: Three times a day (TID) | ORAL | Status: DC | PRN
  Administered 2021-07-17: 5 mg via ORAL
  Filled 2021-07-17: qty 1

## 2021-07-17 MED ORDER — HYDROCODONE-ACETAMINOPHEN 5-325 MG PO TABS: 1.0000 | ORAL_TABLET | ORAL | Status: DC | PRN

## 2021-07-17 MED ORDER — LIDOCAINE HCL (PF) 2 % IJ SOLN
INTRAMUSCULAR | Status: AC
  Filled 2021-07-17: qty 5

## 2021-07-17 MED ORDER — DEXAMETHASONE SODIUM PHOSPHATE 10 MG/ML IJ SOLN
INTRAMUSCULAR | Status: AC
  Filled 2021-07-17: qty 1

## 2021-07-17 MED ORDER — PHENYLEPHRINE 40 MCG/ML (10ML) SYRINGE FOR IV PUSH (FOR BLOOD PRESSURE SUPPORT)
PREFILLED_SYRINGE | INTRAVENOUS | Status: DC | PRN
  Administered 2021-07-17 (×6): 80 ug via INTRAVENOUS
  Administered 2021-07-17: 120 ug via INTRAVENOUS

## 2021-07-17 MED ORDER — ALBUTEROL SULFATE (2.5 MG/3ML) 0.083% IN NEBU
2.5000 mg | INHALATION_SOLUTION | Freq: Four times a day (QID) | RESPIRATORY_TRACT | Status: DC | PRN
  Administered 2021-07-17: 2.5 mg via RESPIRATORY_TRACT

## 2021-07-17 MED ORDER — FENTANYL CITRATE PF 50 MCG/ML IJ SOSY: 25.0000 ug | PREFILLED_SYRINGE | INTRAMUSCULAR | Status: DC | PRN

## 2021-07-17 MED ORDER — BELLADONNA ALKALOIDS-OPIUM 16.2-30 MG RE SUPP
RECTAL | Status: AC
  Filled 2021-07-17: qty 1

## 2021-07-17 MED ORDER — SUGAMMADEX SODIUM 200 MG/2ML IV SOLN
INTRAVENOUS | Status: DC | PRN
  Administered 2021-07-17: 400 mg via INTRAVENOUS

## 2021-07-17 MED ORDER — 0.9 % SODIUM CHLORIDE (POUR BTL) OPTIME
TOPICAL | Status: DC | PRN
  Administered 2021-07-17 (×2): 1000 mL

## 2021-07-17 MED ORDER — PROPOFOL 10 MG/ML IV BOLUS
INTRAVENOUS | Status: DC | PRN
Start: 2021-07-17 — End: 2021-07-17
  Administered 2021-07-17: 50 mg via INTRAVENOUS
  Administered 2021-07-17 (×2): 100 mg via INTRAVENOUS

## 2021-07-17 MED ORDER — MIDAZOLAM HCL 2 MG/2ML IJ SOLN
INTRAMUSCULAR | Status: AC
  Filled 2021-07-17: qty 2

## 2021-07-17 MED ORDER — ONDANSETRON HCL 4 MG/2ML IJ SOLN
INTRAMUSCULAR | Status: AC
  Filled 2021-07-17: qty 2

## 2021-07-17 MED ORDER — ONDANSETRON HCL 4 MG/2ML IJ SOLN
INTRAMUSCULAR | Status: DC | PRN
  Administered 2021-07-17: 4 mg via INTRAVENOUS

## 2021-07-17 MED ORDER — BELLADONNA ALKALOIDS-OPIUM 16.2-60 MG RE SUPP
RECTAL | Status: DC | PRN
  Administered 2021-07-17: 1 via RECTAL

## 2021-07-17 MED ORDER — LOSARTAN POTASSIUM 50 MG PO TABS
100.0000 mg | ORAL_TABLET | Freq: Every day | ORAL | Status: DC
  Administered 2021-07-18: 100 mg via ORAL
  Filled 2021-07-17: qty 2

## 2021-07-17 MED ORDER — PHENYLEPHRINE HCL (PRESSORS) 10 MG/ML IV SOLN
INTRAVENOUS | Status: AC
  Filled 2021-07-17: qty 1

## 2021-07-17 MED ORDER — DEXAMETHASONE SODIUM PHOSPHATE 10 MG/ML IJ SOLN
INTRAMUSCULAR | Status: DC | PRN
  Administered 2021-07-17: 10 mg via INTRAVENOUS

## 2021-07-17 MED ORDER — CHLORHEXIDINE GLUCONATE 0.12 % MT SOLN
15.0000 mL | Freq: Once | OROMUCOSAL | Status: AC
  Administered 2021-07-17: 15 mL via OROMUCOSAL

## 2021-07-17 MED ORDER — HYDROMORPHONE HCL 1 MG/ML IJ SOLN: 0.5000 mg | INTRAMUSCULAR | Status: DC | PRN

## 2021-07-17 MED ORDER — CIPROFLOXACIN HCL 500 MG PO TABS
500.0000 mg | ORAL_TABLET | Freq: Two times a day (BID) | ORAL | Status: DC
  Administered 2021-07-17 – 2021-07-18 (×2): 500 mg via ORAL
  Filled 2021-07-17 (×2): qty 1

## 2021-07-17 MED ORDER — MIDAZOLAM HCL 2 MG/2ML IJ SOLN
INTRAMUSCULAR | Status: DC | PRN
  Administered 2021-07-17: 2 mg via INTRAVENOUS

## 2021-07-17 MED ORDER — ATORVASTATIN CALCIUM 10 MG PO TABS
10.0000 mg | ORAL_TABLET | Freq: Every evening | ORAL | Status: DC
  Administered 2021-07-17: 10 mg via ORAL
  Filled 2021-07-17: qty 1

## 2021-07-17 MED ORDER — POLYETHYLENE GLYCOL 3350 17 G PO PACK
17.0000 g | PACK | Freq: Every day | ORAL | Status: DC
  Administered 2021-07-17 – 2021-07-18 (×2): 17 g via ORAL
  Filled 2021-07-17 (×2): qty 1

## 2021-07-17 MED ORDER — ROCURONIUM BROMIDE 10 MG/ML (PF) SYRINGE
PREFILLED_SYRINGE | INTRAVENOUS | Status: DC | PRN
  Administered 2021-07-17 (×2): 10 mg via INTRAVENOUS
  Administered 2021-07-17: 20 mg via INTRAVENOUS
  Administered 2021-07-17: 10 mg via INTRAVENOUS
  Administered 2021-07-17: 20 mg via INTRAVENOUS

## 2021-07-17 MED ORDER — SODIUM CHLORIDE 0.9 % IV SOLN: INTRAVENOUS | Status: DC

## 2021-07-17 MED ORDER — ORAL CARE MOUTH RINSE: 15.0000 mL | Freq: Once | OROMUCOSAL | Status: AC

## 2021-07-17 MED ORDER — LACTATED RINGERS IV SOLN: INTRAVENOUS | Status: DC

## 2021-07-17 MED ORDER — PHENYLEPHRINE 40 MCG/ML (10ML) SYRINGE FOR IV PUSH (FOR BLOOD PRESSURE SUPPORT)
PREFILLED_SYRINGE | INTRAVENOUS | Status: AC
  Filled 2021-07-17: qty 10

## 2021-07-17 MED ORDER — STERILE WATER FOR IRRIGATION IR SOLN
Status: DC | PRN
  Administered 2021-07-17: 3000 mL

## 2021-07-17 MED ORDER — PHENYLEPHRINE HCL-NACL 20-0.9 MG/250ML-% IV SOLN
INTRAVENOUS | Status: DC | PRN
  Administered 2021-07-17: 100 ug/min via INTRAVENOUS
  Administered 2021-07-17: 75 ug/min via INTRAVENOUS

## 2021-07-17 MED ORDER — ALBUTEROL SULFATE (2.5 MG/3ML) 0.083% IN NEBU
INHALATION_SOLUTION | RESPIRATORY_TRACT | Status: AC
  Filled 2021-07-17: qty 3

## 2021-07-17 SURGICAL SUPPLY — 23 items
BAG DRN RND TRDRP ANRFLXCHMBR (UROLOGICAL SUPPLIES)
BAG URINE DRAIN 2000ML AR STRL (UROLOGICAL SUPPLIES) IMPLANT
BAG URO CATCHER STRL LF (MISCELLANEOUS) ×2 IMPLANT
BULB IRRIG PATHFIND (MISCELLANEOUS) ×1 IMPLANT
CATH FOLEY 3WAY 30CC 22FR (CATHETERS) ×1 IMPLANT
CATH URET 5FR 28IN CONE TIP (BALLOONS) ×2
CATH URET 5FR 70CM CONE TIP (BALLOONS) IMPLANT
CATH URETL 5X70 OPEN END (CATHETERS) ×1 IMPLANT
DRAPE FOOT SWITCH (DRAPES) ×2 IMPLANT
ELECT REM PT RETURN 15FT ADLT (MISCELLANEOUS) IMPLANT
EVACUATOR ELLICK (MISCELLANEOUS) IMPLANT
EVACUATOR MICROVAS BLADDER (UROLOGICAL SUPPLIES) ×1 IMPLANT
GLOVE SURG ENC TEXT LTX SZ7.5 (GLOVE) ×2 IMPLANT
GOWN STRL REUS W/TWL XL LVL3 (GOWN DISPOSABLE) ×2 IMPLANT
GUIDEWIRE STR DUAL SENSOR (WIRE) ×1 IMPLANT
KIT TURNOVER KIT A (KITS) IMPLANT
LOOP CUT BIPOLAR 24F LRG (ELECTROSURGICAL) ×2 IMPLANT
MANIFOLD NEPTUNE II (INSTRUMENTS) ×2 IMPLANT
PACK CYSTO (CUSTOM PROCEDURE TRAY) ×2 IMPLANT
SYR TOOMEY IRRIG 70ML (MISCELLANEOUS)
SYRINGE TOOMEY IRRIG 70ML (MISCELLANEOUS) IMPLANT
TUBING CONNECTING 10 (TUBING) ×2 IMPLANT
TUBING UROLOGY SET (TUBING) ×2 IMPLANT

## 2021-07-17 NOTE — Anesthesia Postprocedure Evaluation (Signed)
Anesthesia Post Note  Patient: DARAH SIMKIN  Procedure(s) Performed: CYSTOSCOPY BILATERAL RETROGRADES TRANSURETHRAL RESECTION OF BLADDER TUMOR (TURBT) (Bilateral)     Patient location during evaluation: PACU Anesthesia Type: General Level of consciousness: awake and alert Pain management: pain level controlled Vital Signs Assessment: post-procedure vital signs reviewed and stable Respiratory status: spontaneous breathing, nonlabored ventilation, respiratory function stable and patient connected to nasal cannula oxygen Cardiovascular status: blood pressure returned to baseline and stable Postop Assessment: no apparent nausea or vomiting Anesthetic complications: no   No notable events documented.  Last Vitals:  Vitals:   07/17/21 1130 07/17/21 1230  BP: (!) 145/72 (!) 143/81  Pulse: 92 87  Resp: 18 20  Temp: 36.7 C   SpO2: 98% 96%    Last Pain:  Vitals:   07/17/21 1230  TempSrc:   PainSc: 0-No pain                 Barnet Glasgow

## 2021-07-17 NOTE — Transfer of Care (Signed)
Immediate Anesthesia Transfer of Care Note  Patient: EMIKO OSORTO  Procedure(s) Performed: CYSTOSCOPY BILATERAL RETROGRADES TRANSURETHRAL RESECTION OF BLADDER TUMOR (TURBT) (Bilateral)  Patient Location: PACU  Anesthesia Type:General  Level of Consciousness: awake, sedated and patient cooperative  Airway & Oxygen Therapy: Patient Spontanous Breathing and Patient connected to face mask oxygen  Post-op Assessment: Report given to RN, Post -op Vital signs reviewed and stable and Patient moving all extremities  Post vital signs: stable  Last Vitals:  Vitals Value Taken Time  BP 172/89 07/17/21 1030  Temp    Pulse 83 07/17/21 1030  Resp 28 07/17/21 1030  SpO2 94 % 07/17/21 1030  Vitals shown include unvalidated device data.  Last Pain:  Vitals:   07/17/21 0544  TempSrc:   PainSc: 0-No pain         Complications: No notable events documented.

## 2021-07-17 NOTE — Interval H&P Note (Signed)
History and Physical Interval Note:  07/17/2021 7:12 AM  Cheryl Singh  has presented today for surgery, with the diagnosis of [place].  The various methods of treatment have been discussed with the patient and family. After consideration of risks, benefits and other options for treatment, the patient has consented to  Procedure(s): CYSTOSCOPY BILATERAL RETROGRADES TRANSURETHRAL RESECTION OF BLADDER TUMOR (TURBT) (Bilateral) as a surgical intervention.  The patient's history has been reviewed, patient examined, no change in status, stable for surgery.  I have reviewed the patient's chart and labs.  Questions were answered to the patient's satisfaction.     Remi Haggard

## 2021-07-17 NOTE — Anesthesia Procedure Notes (Signed)
Procedure Name: Intubation Date/Time: 07/17/2021 7:44 AM Performed by: Pilar Grammes, CRNA Pre-anesthesia Checklist: Patient identified, Emergency Drugs available, Suction available, Patient being monitored and Timeout performed Patient Re-evaluated:Patient Re-evaluated prior to induction Oxygen Delivery Method: Circle system utilized Preoxygenation: Pre-oxygenation with 100% oxygen Induction Type: IV induction Ventilation: Two handed mask ventilation required Laryngoscope Size: 3 and Glidescope Grade View: Grade I Tube type: Oral Tube size: 7.0 mm Number of attempts: 4 Airway Equipment and Method: Stylet Placement Confirmation: positive ETCO2, ETT inserted through vocal cords under direct vision, CO2 detector and breath sounds checked- equal and bilateral Secured at: 22 cm Tube secured with: Tape Dental Injury: Teeth and Oropharynx as per pre-operative assessment  Future Recommendations: Recommend- induction with short-acting agent, and alternative techniques readily available Comments: Easy 2 handed mask.  Attempted LMA x 2 w #5 and #4.  LMAs would not seat.  Suctioned well.  Exposed w Sabra Heck 2/Deleon Passe, CRNA with redundant tissue noted and cricoid pressure still with difficulty.  Exposed x1/[place], MD w same result then Successful Glidescope intubation.  BBS.  Sats maintained throughout.

## 2021-07-17 NOTE — Op Note (Signed)
Preoperative diagnosis:  1.  Bladder cancer  Postoperative diagnosis: 1.  Bladder cancer  Procedure(s): 1.  Cystoscopy, bilateral retrograde pyelogram with intraoperative interpretation, transurethral section of bladder tumor (large)  Surgeon: Dr. Harold Barban  Anesthesia: General  Complications: None  EBL: 50 cc  Specimens: Bladder tumor  Disposition of specimens: To pathology  Intraoperative findings: Large at least 5 cm nodular bladder tumor emanating from the left floor the bladder just superior to the left ureteral orifice and extending on the left lateral bladder wall.  Tumor was incompletely resected but  appeared to be muscle invasive.  Ureteral orifice ease was not involved with tumor.  Retrograde showed mild dilation of the left ureter but no filling defects noted.  Retrograde on the right was normal  Indication: 71 year old white female with gross hematuria.  Cystoscopy shows large bladder tumor emanating from left lateral bladder wall presents at this time undergo cystoscopy and TURBT  Description of procedure:  After obtaining informed consent for the patient she was taken the major cystoscopy suite placed under general anesthesia.  Placed in the dorsolithotomy position genitalia prepped and draped in usual sterile fashion.  Proper pause and timeout was performed.  103 [place] the scope was advanced in the bladder under direct vision.  Patient was noted to have gross hematuria.  Ureteral orifice ease were identified and there was a large nodular tumor extending just superior from the left ureteral orifice up the lateral bladder wall along the posterior floor and wall of the bladder at least 5 cm.  A open tip catheter was utilized to perform retrograde pyelograms on both sides.  There was prompt filling of the left ureter which appeared dilated but emptied out upon removal of the retrograde catheter.  No filling defects noted.  Right side was normal.  Attention was then  directed towards resection.  Utilizing the bipolar saline resectoscope resection was commenced.  A large portion of the tumor was very necrotic and avascular at the superior portion of the tumor.  I commenced resection and continued down along the to the floor the bladder and resected into the muscle obtaining pathology.  Resection continued for approximately 2 hours.  I was unable to completely resect the tumor but felt like based on appearance was probably muscle invasive and therefore I stopped resecting to get pathology.  Hemostasis obtained with cautery loop.  A 22 French Foley was placed after irrigating the bladder tumor chips from the bladder.  Urine was clear at termination of the case.  Plan will be to get pathology and if muscle invasive likely will need cystectomy.  If not muscle invasive consider repeat resection.  Patient was subsequent awakened from anesthesia and taken back to the recovery room stable condition no immediate complication from the procedure.

## 2021-07-18 ENCOUNTER — Encounter (HOSPITAL_COMMUNITY): Payer: Self-pay | Admitting: Urology

## 2021-07-18 DIAGNOSIS — C679 Malignant neoplasm of bladder, unspecified: Secondary | ICD-10-CM | POA: Diagnosis not present

## 2021-07-18 MED ORDER — TRAMADOL HCL 50 MG PO TABS: 50.0000 mg | ORAL_TABLET | Freq: Four times a day (QID) | ORAL | 0 refills | Status: DC | PRN

## 2021-07-18 MED ORDER — COVID-19MRNA BIVAL VACC PFIZER 30 MCG/0.3ML IM SUSP
0.3000 mL | Freq: Once | INTRAMUSCULAR | Status: DC
  Filled 2021-07-18: qty 0.3

## 2021-07-18 NOTE — Progress Notes (Signed)
Reviewed discharge instructions with patient including medications, appointments, and foley/leg bag care.  Patient successfully demonstrated foley/leg bag care. RN answered patient questions.  Shilo Philipson, Laurel Dimmer, RN

## 2021-07-18 NOTE — Plan of Care (Signed)
Problem: Education: Goal: Knowledge of the prescribed therapeutic regimen will improve Outcome: Completed/Met   Problem: Skin Integrity: Goal: Demonstration of wound healing without infection will improve Outcome: Completed/Met   Problem: Urinary Elimination: Goal: Ability to avoid or minimize complications of infection will improve Outcome: Completed/Met   Problem: Education: Goal: Knowledge of General Education information will improve Description: Including pain rating scale, medication(s)/side effects and non-pharmacologic comfort measures Outcome: Completed/Met   Problem: Activity: Goal: Risk for activity intolerance will decrease Outcome: Completed/Met

## 2021-07-18 NOTE — Progress Notes (Signed)
1 Day Post-Op Subjective: Patient feeling well this morning.  Minimal discomfort other than catheter discomfort.  Urine has remained peach to clear in color with no clots.  I discussed OR findings yesterday with which showed large bladder mass and needed to get surgical pathology before making further disposition.  Patient will also need CT scan of abdomen pelvis.  Objective: Vital signs in last 24 hours: Temp:  [98 F (36.7 C)-99.2 F (37.3 C)] 99.2 F (37.3 C) (01/18 0519) Pulse Rate:  [68-97] 87 (01/18 0519) Resp:  [12-20] 20 (01/18 0519) BP: (119-172)/(59-99) 144/76 (01/18 0519) SpO2:  [91 %-100 %] 93 % (01/18 0519)  Intake/Output from previous day: 01/17 0701 - 01/18 0700 In: 1627.6 [I.V.:1627.6] Out: 1300 [Urine:1100; Blood:200] Intake/Output this shift: No intake/output data recorded.  Physical Exam:  General: Alert and oriented  Lab Results: No results for input(s): HGB, HCT in the last 72 hours. BMET No results for input(s): NA, K, CL, CO2, GLUCOSE, BUN, CREATININE, CALCIUM in the last 72 hours.   Studies/Results: DG C-Arm 1-60 Min-No Report  Result Date: 07/17/2021 Fluoroscopy was utilized by the requesting physician.  No radiographic interpretation.   DG C-Arm 1-60 Min-No Report  Result Date: 07/17/2021 Fluoroscopy was utilized by the requesting physician.  No radiographic interpretation.   DG C-Arm 1-60 Min-No Report  Result Date: 07/17/2021 Fluoroscopy was utilized by the requesting physician.  No radiographic interpretation.    Assessment/Plan: 1.  Status post TURBT, doing well. Plan/recommendation: Plan for discharge home with Foley catheter return in 1 week for follow-up of surgical pathology.  We will obtain CT scan of abdomen pelvis as well as outpatient    LOS: 0 days   Remi Haggard 07/18/2021, 7:34 AM

## 2021-07-18 NOTE — Discharge Summary (Signed)
Date of admission: 07/17/2021  Date of discharge: 07/18/2021  Admission diagnosis: Bladder cancer  Discharge diagnosis: Bladder cancer  Secondary diagnoses:   History and Physical: For full details, please see admission history and physical. Briefly, Cheryl Singh is a 71 y.o. year old patient with recurrent gross hematuria.  She was found on office surveillance cystoscopy to have a large left-sided bladder wall mass.  Presents at this time undergo cystoscopy TURBT.   Hospital Course: Patient was admitted on 07/17/2021 after undergoing cystoscopy and TURBT.  For details procedure please see the typed operative note.  Patient was left with indwelling Foley catheter and urine remained peach colored but did not require continuous bladder irrigation.  Her pain was well managed.  She was felt ready for discharge home on 07/18/2021.  Plan will be for discharge home with Foley catheter.  She is scheduled to see me in 1 week to review pathology and in the interim plan to get CT scan of abdomen pelvis and will discuss results on return visit.  Patient has our telephone number and knows to call should problems arise relative to her management in the interim.  She is discharged home on routine preoperative medications plus tramadol 50 mg p.o. every 6 hours as needed for pain.  Laboratory values: No results for input(s): HGB, HCT in the last 72 hours. No results for input(s): CREATININE in the last 72 hours.  Disposition: Home  Discharge instruction: The patient was instructed to be ambulatory but told to refrain from heavy lifting, strenuous activity, or driving.   Discharge medications:  Allergies as of 07/18/2021   No Known Allergies      Medication List     TAKE these medications    atorvastatin 10 MG tablet Commonly known as: LIPITOR Take 10 mg by mouth every evening.   ciprofloxacin 500 MG tablet Commonly known as: CIPRO Take 500 mg by mouth 2 (two) times daily.   CULTURELLE  DIGESTIVE DAILY PO Take 1 Package by mouth daily.   Fish Oil 1000 MG Caps Take 1,000 mg by mouth daily.   FOLIC ACID PO Take 1 tablet by mouth daily.   losartan 50 MG tablet Commonly known as: COZAAR Take 100 mg by mouth daily.   polyethylene glycol 17 g packet Commonly known as: MIRALAX / GLYCOLAX Take 17 g by mouth daily.   traMADol 50 MG tablet Commonly known as: Ultram Take 1 tablet (50 mg total) by mouth every 6 (six) hours as needed.   Vitamin D3 50 MCG (2000 UT) capsule Take 2,000 Units by mouth daily.        Followup:

## 2021-07-19 LAB — SURGICAL PATHOLOGY

## 2021-07-26 DIAGNOSIS — C678 Malignant neoplasm of overlapping sites of bladder: Secondary | ICD-10-CM | POA: Diagnosis not present

## 2021-08-02 DIAGNOSIS — C678 Malignant neoplasm of overlapping sites of bladder: Secondary | ICD-10-CM | POA: Diagnosis not present

## 2021-08-10 DIAGNOSIS — C678 Malignant neoplasm of overlapping sites of bladder: Secondary | ICD-10-CM | POA: Diagnosis not present

## 2021-08-15 ENCOUNTER — Encounter: Payer: Self-pay | Admitting: *Deleted

## 2021-08-15 NOTE — Progress Notes (Signed)
Reached out to Cheryl Singh to introduce myself as the office RN Navigator and explain our new patient process. Reviewed the reason for their referral and scheduled their new patient appointment along with labs. Provided address and directions to the office including call back phone number. Reviewed with patient any concerns they may have or any possible barriers to attending their appointment.   Patient has been seen before by Cheryl Dawson NP for benign heme concerns.   Informed patient about my role as a navigator and that I will meet with them prior to their New Patient appointment and more fully discuss what services I can provide. At this time patient has no further questions or needs.     Oncology Nurse Navigator Documentation  Oncology Nurse Navigator Flowsheets 08/15/2021  Abnormal Finding Date 07/16/2021  Confirmed Diagnosis Date 07/17/2021  Diagnosis Status Confirmed Diagnosis Complete  Navigator Follow Up Date: 08/16/2021  Navigator Follow Up Reason: New Patient Appointment  Navigator Location [facility]  Referral Date to RadOnc/MedOnc 08/14/2021  Navigator Encounter Type Introductory Phone Call  Patient Visit Type MedOnc  Treatment Phase Pre-Tx/Tx Discussion  Barriers/Navigation Needs Coordination of Care;Education  Education Other  Interventions Coordination of Care;Education  Acuity Level 2-Minimal Needs (1-2 Barriers Identified)  Coordination of Care Appts  Education Method Verbal  Time Spent with Patient 81

## 2021-08-16 ENCOUNTER — Other Ambulatory Visit: Payer: Self-pay

## 2021-08-16 ENCOUNTER — Encounter: Payer: Self-pay | Admitting: *Deleted

## 2021-08-16 ENCOUNTER — Encounter: Payer: Self-pay | Admitting: Hematology & Oncology

## 2021-08-16 ENCOUNTER — Inpatient Hospital Stay: Payer: Medicare PPO | Admitting: Hematology & Oncology

## 2021-08-16 ENCOUNTER — Inpatient Hospital Stay: Payer: Medicare PPO | Attending: Family Medicine

## 2021-08-16 VITALS — BP 133/73 | HR 95 | Temp 98.7°F | Resp 18 | Ht 64.0 in | Wt 167.8 lb

## 2021-08-16 DIAGNOSIS — C670 Malignant neoplasm of trigone of bladder: Secondary | ICD-10-CM

## 2021-08-16 DIAGNOSIS — F17210 Nicotine dependence, cigarettes, uncomplicated: Secondary | ICD-10-CM | POA: Insufficient documentation

## 2021-08-16 DIAGNOSIS — I10 Essential (primary) hypertension: Secondary | ICD-10-CM | POA: Diagnosis not present

## 2021-08-16 DIAGNOSIS — C679 Malignant neoplasm of bladder, unspecified: Secondary | ICD-10-CM | POA: Insufficient documentation

## 2021-08-16 DIAGNOSIS — C7951 Secondary malignant neoplasm of bone: Secondary | ICD-10-CM | POA: Diagnosis not present

## 2021-08-16 DIAGNOSIS — Z5111 Encounter for antineoplastic chemotherapy: Secondary | ICD-10-CM | POA: Insufficient documentation

## 2021-08-16 DIAGNOSIS — C775 Secondary and unspecified malignant neoplasm of intrapelvic lymph nodes: Secondary | ICD-10-CM

## 2021-08-16 DIAGNOSIS — C787 Secondary malignant neoplasm of liver and intrahepatic bile duct: Secondary | ICD-10-CM

## 2021-08-16 DIAGNOSIS — Z7189 Other specified counseling: Secondary | ICD-10-CM | POA: Insufficient documentation

## 2021-08-16 HISTORY — DX: Malignant neoplasm of bladder, unspecified: C67.9

## 2021-08-16 HISTORY — DX: Other specified counseling: Z71.89

## 2021-08-16 HISTORY — DX: Secondary and unspecified malignant neoplasm of intrapelvic lymph nodes: C77.5

## 2021-08-16 LAB — SAMPLE TO BLOOD BANK

## 2021-08-16 LAB — CMP (CANCER CENTER ONLY)
ALT: 8 U/L (ref 0–44)
AST: 11 U/L — ABNORMAL LOW (ref 15–41)
Albumin: 3.6 g/dL (ref 3.5–5.0)
Alkaline Phosphatase: 348 U/L — ABNORMAL HIGH (ref 38–126)
Anion gap: 9 (ref 5–15)
BUN: 8 mg/dL (ref 8–23)
CO2: 27 mmol/L (ref 22–32)
Calcium: 9.1 mg/dL (ref 8.9–10.3)
Chloride: 93 mmol/L — ABNORMAL LOW (ref 98–111)
Creatinine: 0.61 mg/dL (ref 0.44–1.00)
GFR, Estimated: >60 mL/min (ref >60)
Glucose, Bld: 113 mg/dL — ABNORMAL HIGH (ref 70–99)
Potassium: 4.3 mmol/L (ref 3.5–5.1)
Sodium: 129 mmol/L — ABNORMAL LOW (ref 135–145)
Total Bilirubin: 0.7 mg/dL (ref 0.3–1.2)
Total Protein: 6.7 g/dL (ref 6.5–8.1)

## 2021-08-16 LAB — CBC WITH DIFFERENTIAL (CANCER CENTER ONLY)
Abs Immature Granulocytes: 0.02 10*3/uL (ref 0.00–0.07)
Basophils Absolute: 0 10*3/uL (ref 0.0–0.1)
Basophils Relative: 0 %
Eosinophils Absolute: 0.1 10*3/uL (ref 0.0–0.5)
Eosinophils Relative: 2 %
HCT: 33.8 % — ABNORMAL LOW (ref 36.0–46.0)
Hemoglobin: 11.4 g/dL — ABNORMAL LOW (ref 12.0–15.0)
Immature Granulocytes: 0 %
Lymphocytes Relative: 10 %
Lymphs Abs: 0.7 10*3/uL (ref 0.7–4.0)
MCH: 33.2 pg (ref 26.0–34.0)
MCHC: 33.7 g/dL (ref 30.0–36.0)
MCV: 98.5 fL (ref 80.0–100.0)
Monocytes Absolute: 1 10*3/uL (ref 0.1–1.0)
Monocytes Relative: 14 %
Neutro Abs: 5.3 10*3/uL (ref 1.7–7.7)
Neutrophils Relative %: 74 %
Platelet Count: 282 10*3/uL (ref 150–400)
RBC: 3.43 MIL/uL — ABNORMAL LOW (ref 3.87–5.11)
RDW: 12.3 % (ref 11.5–15.5)
WBC Count: 7.3 10*3/uL (ref 4.0–10.5)
nRBC: 0 % (ref 0.0–0.2)

## 2021-08-16 LAB — LACTATE DEHYDROGENASE: LDH: 142 U/L (ref 98–192)

## 2021-08-16 LAB — PREALBUMIN: Prealbumin: 10.8 mg/dL — ABNORMAL LOW (ref 18–38)

## 2021-08-16 NOTE — Progress Notes (Signed)
START ON PATHWAY REGIMEN - Bladder     A cycle is every 21 days:     Gemcitabine      Cisplatin   **Always confirm dose/schedule in your pharmacy ordering system**  Patient Characteristics: Advanced/Metastatic Disease, First Line, No Prior Platinum-Based Therapy, Good Renal Function (CrCl ? 50 mL/min) Therapeutic Status: Advanced/Metastatic Disease Line of Therapy: First Line Prior Platinum-Based Therapy<= No Renal Function: Good Renal Function (CrCl ? 50 mL/min) Intent of Therapy: Non-Curative / Palliative Intent, Discussed with Patient 

## 2021-08-16 NOTE — Progress Notes (Signed)
Referral MD  Reason for Referral: Metastatic bladder cancer-liver and bone metastasis  Chief Complaint  Patient presents with   New Patient (Initial Visit)  : I have bladder cancer that is spread.  HPI: Ms. Cheryl Singh is a very nice 71 year old white female.  She is originally from [place].  She is incredibly interesting to talk to.  She comes in with a very good friend who helps take care of her and who is her power of attorney.  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  She worked for the [employer] system.  I would say this is truly a very unusual occupation for a woman.  She has been having some bladder issues.  She does have some recurrent urinary tract infections.  There is some hematuria.  She is then referred to Dr. Harold Barban of [facility].  He obviously did an incredible job.  He did a cystoscopy on her.  I think this was done back in January.  This showed a mass emanating from the left side of the bladder.  She had biopsies that were done.  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  She then had a CT scan that was done.  I think this was done on 08/02/2021.  Unfortunate, this showed multiple osseous metastatic lesions.  There were no fractures.  There appeared to be 2 areas in the liver that could appear to be malignant.  He try to resect out the tumor.  Unfortunately this was not totally resectable.  Based on this, he, I referred her to the [facility] for evaluation.  She is in great shape.  She does not have any further hematuria after having the resection of the bladder tumor.  She does not have any Bony pain.  There is little bit of fatigue.  There is no weight loss or weight gain.  Her appetite has been good.  She has had no cough or shortness of breath.  There has been no headaches.  She has had no leg swelling.  She has had no rashes.  There is been no bowel issues.  She  is adopted so she really does not know her family history.  She used to smoke but stopped many years ago.  I will think she really has any adult beverages.  Again, her performance status is probably ECOG 0.    Past Medical History:  Diagnosis Date   Arthritis    Hypertension   :   Past Surgical History:  Procedure Laterality Date   HEART CHAMBER REVISION     TRANSURETHRAL RESECTION OF BLADDER TUMOR Bilateral 07/17/2021   Procedure: CYSTOSCOPY BILATERAL RETROGRADES TRANSURETHRAL RESECTION OF BLADDER TUMOR (TURBT);  Surgeon: Remi Haggard, MD;  Location: [facility];  Service: Urology;  Laterality: Bilateral;  :   Current Outpatient Medications:    amLODipine (NORVASC) 5 MG tablet, Take 5 mg by mouth daily., Disp: , Rfl:    atorvastatin (LIPITOR) 10 MG tablet, Take 10 mg by mouth every evening., Disp: , Rfl:    Cholecalciferol (VITAMIN D3) 50 MCG (2000 UT) capsule, Take 2,000 Units by mouth daily., Disp: , Rfl:    Lactobacillus-Inulin (CULTURELLE DIGESTIVE DAILY PO), Take 1 Package by mouth daily., Disp: , Rfl:    losartan (COZAAR) 100 MG tablet, Take 100 mg by mouth daily at 6 (six) AM., Disp: , Rfl:    Omega-3 Fatty Acids (FISH OIL) 1000 MG CAPS, Take 1,000 mg by mouth  daily., Disp: , Rfl:    polyethylene glycol (MIRALAX / GLYCOLAX) 17 g packet, Take 17 g by mouth daily., Disp: , Rfl:    traMADol (ULTRAM) 50 MG tablet, Take 1 tablet (50 mg total) by mouth every 6 (six) hours as needed., Disp: 20 tablet, Rfl: 0   FOLIC ACID PO, Take 1 tablet by mouth daily. (Patient not taking: Reported on 08/16/2021), Disp: , Rfl: :  :  No Known Allergies:  No family history on file.:   Social History   Socioeconomic History   Marital status: Single    Spouse name: Not on file   Number of children: Not on file   Years of education: Not on file   Highest education level: Not on file  Occupational History   Not on file  Tobacco Use   Smoking status: Every Day    Packs/day: 0.50     Years: 40.00    Pack years: 20.00    Types: Cigarettes   Smokeless tobacco: Never  Vaping Use   Vaping Use: Never used  Substance and Sexual Activity   Alcohol use: Yes    Comment: occ   Drug use: No   Sexual activity: Not on file  Other Topics Concern   Not on file  Social History Narrative   Not on file   Social Determinants of Health   Financial Resource Strain: Not on file  Food Insecurity: Not on file  Transportation Needs: Not on file  Physical Activity: Not on file  Stress: Not on file  Social Connections: Not on file  Intimate Partner Violence: Not on file  :  Review of Systems  Constitutional: Negative.   HENT: Negative.    Eyes: Negative.   Respiratory: Negative.    Cardiovascular: Negative.   Gastrointestinal: Negative.   Genitourinary:  Positive for hematuria.  Musculoskeletal: Negative.   Skin: Negative.   Neurological: Negative.   Endo/Heme/Allergies: Negative.   Psychiatric/Behavioral: Negative.      Exam: @IPVITALS @ Physical Exam Vitals reviewed.  HENT:     Head: Normocephalic and atraumatic.  Eyes:     Pupils: Pupils are equal, round, and reactive to light.  Cardiovascular:     Rate and Rhythm: Normal rate and regular rhythm.     Heart sounds: Normal heart sounds.  Pulmonary:     Effort: Pulmonary effort is normal.     Breath sounds: Normal breath sounds.  Abdominal:     General: Bowel sounds are normal.     Palpations: Abdomen is soft.  Musculoskeletal:        General: No tenderness or deformity. Normal range of motion.     Cervical back: Normal range of motion.  Lymphadenopathy:     Cervical: No cervical adenopathy.  Skin:    General: Skin is warm and dry.     Findings: No erythema or rash.  Neurological:     Mental Status: She is alert and oriented to person, place, and time.  Psychiatric:        Behavior: Behavior normal.        Thought Content: Thought content normal.        Judgment: Judgment normal.    Recent Labs     08/16/21 1104  WBC 7.3  HGB 11.4*  HCT 33.8*  PLT 282    Recent Labs    08/16/21 1104  NA 129*  K 4.3  CL 93*  CO2 27  GLUCOSE 113*  BUN 8  CREATININE 0.61  CALCIUM 9.1  Blood smear review: None  Pathology: See above    Assessment and Plan: Ms. Cheryl Singh is incredibly nice and incredibly interesting 71 year old white female.  She has been in great health.  She now has metastatic bladder cancer.  I just hate this for her.  She really looks fantastic to have had metastatic bladder cancer.  Her friend, [name], did a wonderful job in taking notes.  I talked to them that this is already stage IV bladder cancer.  I told him that this is a tumor that can be treated but certainly cannot be cured.  Again, I told her that she is in great shape.  We certainly can be aggressive with this.  I do think that a PET scan would clearly help [place] out.  Is not clear as to what might be in her liver.  We are going to send off the bladder biopsy to molecular studies.  I want to see if there is any molecular targets that we might be able to take advantage of.  She will need to have a Port-A-Cath placed if going to chemotherapy.  I think that she would be a great candidate for systemic chemotherapy.  I think standard would be cisplatin/Gemzar.  After this, we would then use immunotherapy.  I do think she would tolerate treatment.  She has good renal function.  I gave she and her friend information sheets about the cisplatin and the Gemzar.  I went over the side effects of each treatment.  Ms. Cheryl Singh agrees to undertake treatment.  I will give her 3 cycles of treatment and then I would assess for response.  Of note, she will need Zometa or Xgeva because of her bone disease  Her alkaline phosphatase is quite elevated so we can probably use this as a marker for response.  We will try to get treatment started in a week or so.  Again, I feel that we can really help her quality of  life and keep her quality of life good and have some quantity of life.  I will plan to see her back when she has her second cycle of treatment.

## 2021-08-16 NOTE — Progress Notes (Signed)
Per Dr Marin Olp, request for [facility] testing sent on specimen [id number] DOS 07/17/2021.  Initial RN Navigator Patient Visit  Name: Cheryl Singh Date of Referral : 08/14/2021 Diagnosis: Metastatic Bladder Cancer  Met with patient prior to their visit with MD. Hanley Seamen patient "Your Patient Navigator" handout which explains my role, areas in which I am able to help, and all the contact information for myself and the office. Also gave patient MD and Navigator business card. Reviewed with patient the general overview of expected course after initial diagnosis and time frame for all steps to be completed.  New patient packet given to patient which includes: orientation to office and staff; campus directory; education on My Chart and Advance Directives; and patient centered education on bladder cancer.   Patient completed visit with Dr. Marin Olp. She will need PET, port placement, and chemo education. Will schedule once Dr Marin Olp places orders.  Patient understands all follow up procedures and expectations. They have my number to reach out for any further clarification or additional needs.    Oncology Nurse Navigator Documentation  Oncology Nurse Navigator Flowsheets 08/16/2021  Abnormal Finding Date -  Confirmed Diagnosis Date -  Diagnosis Status -  Navigator Follow Up Date: 08/17/2021  Navigator Follow Up Reason: Appointment Review  Navigator Location [facility]  Referral Date to RadOnc/MedOnc -  Navigator Encounter Type Initial MedOnc;Molecular Studies;Education  Patient Visit Type MedOnc  Treatment Phase Pre-Tx/Tx Discussion  Barriers/Navigation Needs Coordination of Care;Education  Education Newly Diagnosed Cancer Education  Interventions Coordination of Care;Education;Psycho-Social Support  Acuity Level 2-Minimal Needs (1-2 Barriers Identified)  Coordination of Care -  Education Method Verbal;Written  Support Groups/Services Friends and Family  Time Spent with Patient  26

## 2021-08-17 ENCOUNTER — Encounter: Payer: Self-pay | Admitting: *Deleted

## 2021-08-17 LAB — IRON AND IRON BINDING CAPACITY (CC-WL,HP ONLY)
Iron: 55 ug/dL (ref 28–170)
Saturation Ratios: 18 % (ref 10.4–31.8)
TIBC: 314 ug/dL (ref 250–450)
UIBC: 259 ug/dL (ref 148–442)

## 2021-08-17 LAB — FERRITIN: Ferritin: 129 ng/mL (ref 11–307)

## 2021-08-17 NOTE — Progress Notes (Signed)
Port Placement scheduled for 08/20/2021. Patient to arrive at [facility] main entrance at 12 pm, go to admissions, NPO after 8 am, needs driver, may take am meds, needs 24 hour supervision.  PET scheduled for 08/31/2021. Cheryl Singh. Arrive at 7:30a, NPO after midnight except for water.   Chemo education scheduled for 08/28/2021.  Chemo start scheduled on 08/28/2021.  Called patient to educate her on all the above but she requests that I call back at 1pm.  Called and spoke to patient and her support [name].   Oncology Nurse Navigator Documentation  Oncology Nurse Navigator Flowsheets 08/17/2021  Abnormal Finding Date -  Confirmed Diagnosis Date -  Diagnosis Status -  Navigator Follow Up Date: 08/28/2021  Navigator Follow Up Reason: Chemotherapy;Education  Navigator Location [facility]  Referral Date to RadOnc/MedOnc -  Navigator Encounter Type Telephone  Telephone Appt Confirmation/Clarification;Outgoing Call  Patient Visit Type MedOnc  Treatment Phase Pre-Tx/Tx Discussion  Barriers/Navigation Needs Coordination of Care;Education  Education Pain/ Symptom Management;Preparing for Upcoming Surgery/ Treatment  Interventions Coordination of Care;Education;Psycho-Social Support  Acuity Level 2-Minimal Needs (1-2 Barriers Identified)  Coordination of Care Appts;Radiology  Education Method Verbal  Support Groups/Services Friends and Family  Time Spent with Patient 30

## 2021-08-20 ENCOUNTER — Encounter (HOSPITAL_COMMUNITY): Payer: Self-pay

## 2021-08-20 ENCOUNTER — Other Ambulatory Visit: Payer: Self-pay

## 2021-08-20 ENCOUNTER — Other Ambulatory Visit: Payer: Self-pay | Admitting: Hematology & Oncology

## 2021-08-20 ENCOUNTER — Ambulatory Visit (HOSPITAL_COMMUNITY)
Admission: RE | Admit: 2021-08-20 | Discharge: 2021-08-20 | Disposition: A | Discharge status: Home / Self Care | Payer: Medicare PPO | Source: Ambulatory Visit | Attending: Hematology & Oncology | Admitting: Hematology & Oncology

## 2021-08-20 ENCOUNTER — Other Ambulatory Visit: Payer: Self-pay | Admitting: Radiology

## 2021-08-20 DIAGNOSIS — C679 Malignant neoplasm of bladder, unspecified: Secondary | ICD-10-CM

## 2021-08-20 DIAGNOSIS — R319 Hematuria, unspecified: Secondary | ICD-10-CM | POA: Insufficient documentation

## 2021-08-20 DIAGNOSIS — C787 Secondary malignant neoplasm of liver and intrahepatic bile duct: Secondary | ICD-10-CM | POA: Diagnosis not present

## 2021-08-20 DIAGNOSIS — F17210 Nicotine dependence, cigarettes, uncomplicated: Secondary | ICD-10-CM | POA: Insufficient documentation

## 2021-08-20 DIAGNOSIS — C7951 Secondary malignant neoplasm of bone: Secondary | ICD-10-CM | POA: Insufficient documentation

## 2021-08-20 DIAGNOSIS — Z452 Encounter for adjustment and management of vascular access device: Secondary | ICD-10-CM | POA: Diagnosis not present

## 2021-08-20 HISTORY — PX: IR IMAGING GUIDED PORT INSERTION: IMG5740

## 2021-08-20 MED ORDER — MIDAZOLAM HCL 2 MG/2ML IJ SOLN
INTRAMUSCULAR | Status: AC | PRN
  Administered 2021-08-20: 1 mg via INTRAVENOUS
  Administered 2021-08-20 (×2): .5 mg via INTRAVENOUS

## 2021-08-20 MED ORDER — LIDOCAINE-EPINEPHRINE 1 %-1:100000 IJ SOLN
INTRAMUSCULAR | Status: AC
  Filled 2021-08-20: qty 1

## 2021-08-20 MED ORDER — MIDAZOLAM HCL 2 MG/2ML IJ SOLN
INTRAMUSCULAR | Status: AC
  Filled 2021-08-20: qty 2

## 2021-08-20 MED ORDER — HEPARIN SOD (PORK) LOCK FLUSH 100 UNIT/ML IV SOLN
INTRAVENOUS | Status: AC
  Filled 2021-08-20: qty 5

## 2021-08-20 MED ORDER — FENTANYL CITRATE (PF) 100 MCG/2ML IJ SOLN
INTRAMUSCULAR | Status: AC
  Filled 2021-08-20: qty 2

## 2021-08-20 MED ORDER — LIDOCAINE-EPINEPHRINE 1 %-1:100000 IJ SOLN
INTRAMUSCULAR | Status: AC | PRN
  Administered 2021-08-20: 20 mL

## 2021-08-20 MED ORDER — FENTANYL CITRATE (PF) 100 MCG/2ML IJ SOLN
INTRAMUSCULAR | Status: AC | PRN
  Administered 2021-08-20 (×2): 12.5 ug via INTRAVENOUS
  Administered 2021-08-20 (×2): 25 ug via INTRAVENOUS

## 2021-08-20 MED ORDER — HEPARIN SOD (PORK) LOCK FLUSH 100 UNIT/ML IV SOLN
INTRAVENOUS | Status: AC | PRN
  Administered 2021-08-20: 500 [IU] via INTRAVENOUS

## 2021-08-20 NOTE — Procedures (Signed)
Interventional Radiology Procedure Note  Date of Procedure: 08/20/2021  Procedure: Chest port placement   Findings:  1. Chest port placement via right IJ   Complications: No immediate complications noted.   Estimated Blood Loss: minimal  Follow-up and Recommendations: 1. Ready for use    Albin Felling, MD  Vascular & Interventional Radiology  08/20/2021 3:02 PM

## 2021-08-20 NOTE — H&P (Incomplete)
Chief Complaint: Patient was seen in consultation today for [facility] a cath placement at the request of Ennever,Peter R  Referring Physician(s): Ennever,Peter R  Supervising Physician: Aletta Edouard  Patient Status: [facility] - Out-pt  History of Present Illness: Cheryl Singh is a 71 y.o. 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  Medications: Prior to Admission medications   Medication Sig Start Date End Date Taking? Authorizing Provider  amLODipine (NORVASC) 5 MG tablet Take 5 mg by mouth daily. 07/12/21   [provider]  atorvastatin (LIPITOR) 10 MG tablet Take 10 mg by mouth every evening.    [provider]  Cholecalciferol (VITAMIN D3) 50 MCG (2000 UT) capsule Take 2,000 Units by mouth daily.    [provider]  FOLIC ACID PO Take 1 tablet by mouth daily. Patient not taking: Reported on 08/16/2021    [provider]  Lactobacillus-Inulin (CULTURELLE DIGESTIVE DAILY PO) Take 1 Package by mouth daily.    [provider]  losartan (COZAAR) 100 MG tablet Take 100 mg by mouth daily at 6 (six) AM.    [provider]  Omega-3 Fatty Acids  (FISH OIL) 1000 MG CAPS Take 1,000 mg by mouth daily.    [provider]  polyethylene glycol (MIRALAX / GLYCOLAX) 17 g packet Take 17 g by mouth daily.    [provider]  traMADol (ULTRAM) 50 MG tablet Take 1 tablet (50 mg total) by mouth every 6 (six) hours as needed. 07/18/21 07/18/22  Remi Haggard, MD     History reviewed. No pertinent family history.  Social History   Socioeconomic History   Marital status: Single    Spouse name: Not on file   Number of children: Not on file   Years of education: Not on file   Highest education level: Not on file  Occupational History   Not on file  Tobacco Use   Smoking status: Every Day    Packs/day: 0.50    Years: 40.00    Pack years: 20.00    Types: Cigarettes   Smokeless tobacco: Never  Vaping Use   Vaping Use: Never used  Substance and Sexual Activity   Alcohol use: Yes    Comment: occ   Drug use: No   Sexual activity: Not on file  Other Topics Concern   Not on file  Social History Narrative   Not on file   Social Determinants of Health   Financial Resource Strain: Not on file  Food Insecurity: Not on file  Transportation Needs: Not on file  Physical Activity: Not on file  Stress: Not on file  Social Connections: Not on file    ECOG Status: {CHL ONC ECOG YI:[id number]}  Review  of Systems: A 12 point ROS discussed and pertinent positives are indicated in the HPI above.  All other systems are negative.  Review of Systems  Vital Signs: BP (!) 141/82    Pulse (!) 103    Temp 98.1 F (36.7 C) (Oral)    Resp 17    Ht 5\' 4"  (1.626 m)    Wt 165 lb (74.8 kg)    SpO2 99%    BMI 28.32 kg/m   Physical Exam  Imaging: No results found.  Labs:  CBC: Recent Labs    06/15/21 1430 07/13/21 1000 08/16/21 1104  WBC 6.9 6.9 7.3  HGB 12.3 12.2 11.4*  HCT 36.6 36.0 33.8*  PLT 397 352 282    COAGS: No results for input(s): INR, APTT in the last 8760 hours.  BMP: Recent Labs     06/15/21 1430 07/13/21 1000 08/16/21 1104  NA 133* 126* 129*  K 4.1 4.2 4.3  CL 97* 91* 93*  CO2 30 24 27   GLUCOSE 111* 102* 113*  BUN 6* <5* 8  CALCIUM 9.5 8.9 9.1  CREATININE 0.69 0.51 0.61  GFRNONAA >60 >60 >60    LIVER FUNCTION TESTS: Recent Labs    06/15/21 1430 08/16/21 1104  BILITOT 0.4 0.7  AST 22 11*  ALT 20 8  ALKPHOS 112 348*  PROT 6.5 6.7  ALBUMIN 3.8 3.6    TUMOR MARKERS: No results for input(s): AFPTM, CEA, CA199, CHROMGRNA in the last 8760 hours.  Assessment and Plan:  ***  Thank you for this interesting consult.  I greatly enjoyed meeting Cheryl Singh and look forward to participating in their care.  A copy of this report was sent to the requesting provider on this date.  Electronically Signed: Lavonia Drafts, PA-C 08/20/2021, 12:23 PM   I spent a total of {New DGUY:[id number]} {New Out-Pt:[id number]}  {Established Out-Pt:[id number]} in face to face in clinical consultation, greater than 50% of which was counseling/coordinating care for ***

## 2021-08-20 NOTE — H&P (Signed)
Chief Complaint: Patient was seen in consultation today for [facility] a cath placement at the request of Ennever,Peter R  Referring Physician(s): Ennever,Peter R  Supervising Physician: Aletta Edouard  Patient Status: [facility] - Out-pt  History of Present Illness: Cheryl Singh is a 71 y.o. female    Hematuria Bladder mass and bx 07/2021:  + Urothelial carcinoma with stromal necrosis Follows with Dr Devoria Glassing  CT 08/04/2021: IMPRESSION: 1. Large infiltrative mass involving the left aspect of the bladder with suspected subserosal extension of tumor into the perivesical fat. No resulting obstruction of the ureterovesical junction. 2. Multiple sclerotic osseous lesions suspicious for widespread osseous metastatic disease. No lytic lesion or pathologic fracture identified. 3. Two indeterminate hypoenhancing liver lesions are suspicious for metastatic disease. If it would change the patient's management, these could be further evaluated with abdominal MRI without and with contrast. 4. No other evidence of metastatic disease. Specifically, no suspicious adenopathy or pulmonary nodularity. 5. Moderate distal colonic diverticulosis. 6. Aortic Atherosclerosis (ICD10-I70.0).   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  Medications: Prior to Admission medications   Medication Sig Start Date End Date Taking? Authorizing Provider  amLODipine (NORVASC) 5 MG tablet Take 5 mg by mouth daily. 07/12/21  Yes [provider]  atorvastatin (LIPITOR) 10 MG tablet Take 10 mg by mouth every evening.   Yes [provider]  Cholecalciferol (VITAMIN D3) 50 MCG (2000 UT) capsule Take 2,000 Units by mouth daily.   Yes [provider]  Lactobacillus-Inulin (CULTURELLE DIGESTIVE DAILY PO) Take 1 Package by mouth daily.   Yes [provider]  losartan (COZAAR) 100 MG tablet Take 100 mg by mouth daily at 6 (six) AM.   Yes [provider]  Omega-3 Fatty Acids (FISH OIL) 1000 MG CAPS Take 1,000 mg by mouth daily.   Yes [provider]  polyethylene glycol (MIRALAX / GLYCOLAX) 17 g packet Take 17 g by mouth daily.   Yes [provider]  traMADol (ULTRAM) 50 MG tablet Take 1 tablet (50 mg total) by mouth every 6 (six) hours as needed. 07/18/21 07/18/22 Yes Remi Haggard, MD  FOLIC ACID PO Take 1 tablet by mouth daily. Patient not taking: Reported on 08/16/2021    [provider]     History reviewed. No pertinent family history.  Social History   Socioeconomic History   Marital status: Single    Spouse name: Not on file   Number of children: Not on file   Years of education: Not on file   Highest education level: Not on file  Occupational History   Not on file  Tobacco Use   Smoking status: Every Day    Packs/day: 0.50    Years: 40.00    Pack years: 20.00  Types: Cigarettes   Smokeless tobacco: Never  Vaping Use   Vaping Use: Never used  Substance and Sexual Activity   Alcohol use: Yes    Comment: occ   Drug use: No   Sexual activity: Not on file  Other Topics Concern   Not on file  Social History Narrative   Not on file   Social Determinants of Health    Financial Resource Strain: Not on file  Food Insecurity: Not on file  Transportation Needs: Not on file  Physical Activity: Not on file  Stress: Not on file  Social Connections: Not on file    Review of Systems: A 12 point ROS discussed and pertinent positives are indicated in the HPI above.  All other systems are negative.  Review of Systems  Constitutional:  Negative for activity change, fatigue and fever.  Respiratory:  Negative for cough and shortness of breath.   Gastrointestinal:  Negative for abdominal pain.  Neurological:  Negative for weakness.  Psychiatric/Behavioral:  Negative for behavioral problems and confusion.    Vital Signs: BP (!) 141/82    Pulse (!) 103    Temp 98.1 F (36.7 C) (Oral)    Resp 17    Ht 5\' 4"  (1.626 m)    Wt 165 lb (74.8 kg)    SpO2 99%    BMI 28.32 kg/m   Physical Exam Vitals reviewed.  HENT:     Mouth/Throat:     Mouth: Mucous membranes are moist.  Cardiovascular:     Rate and Rhythm: Normal rate and regular rhythm.     Heart sounds: Normal heart sounds.  Pulmonary:     Effort: Pulmonary effort is normal.     Breath sounds: Normal breath sounds.  Abdominal:     Palpations: Abdomen is soft.  Musculoskeletal:        General: Normal range of motion.  Skin:    General: Skin is warm.  Neurological:     Mental Status: She is alert and oriented to person, place, and time.  Psychiatric:        Behavior: Behavior normal.    Imaging: No results found.  Labs:  CBC: Recent Labs    06/15/21 1430 07/13/21 1000 08/16/21 1104  WBC 6.9 6.9 7.3  HGB 12.3 12.2 11.4*  HCT 36.6 36.0 33.8*  PLT 397 352 282    COAGS: No results for input(s): INR, APTT in the last 8760 hours.  BMP: Recent Labs    06/15/21 1430 07/13/21 1000 08/16/21 1104  NA 133* 126* 129*  K 4.1 4.2 4.3  CL 97* 91* 93*  CO2 30 24 27   GLUCOSE 111* 102* 113*  BUN 6* <5* 8  CALCIUM 9.5 8.9 9.1  CREATININE 0.69 0.51 0.61  GFRNONAA >60 >60 >60    LIVER  FUNCTION TESTS: Recent Labs    06/15/21 1430 08/16/21 1104  BILITOT 0.4 0.7  AST 22 11*  ALT 20 8  ALKPHOS 112 348*  PROT 6.5 6.7  ALBUMIN 3.8 3.6    TUMOR MARKERS: No results for input(s): AFPTM, CEA, CA199, CHROMGRNA in the last 8760 hours.  Assessment and Plan:  Bladder Ca Bony and liver mets Scheduled to start chemo next week For Port a cath placement today Risks and benefits of image guided port-a-catheter placement was discussed with the patient including, but not limited to bleeding, infection, pneumothorax, or fibrin sheath development and need for additional procedures.  All of the patient's questions were answered, patient is agreeable to proceed.  Consent signed and in chart.   Thank you for this interesting consult.  I greatly enjoyed meeting Cheryl Singh and look forward to participating in their care.  A copy of this report was sent to the requesting provider on this date.  Electronically Signed: Lavonia Drafts, PA-C 08/20/2021, 12:30 PM   I spent a total of  30 Minutes   in face to face in clinical consultation, greater than 50% of which was counseling/coordinating care for [facility] placement

## 2021-08-21 NOTE — Progress Notes (Signed)
Pharmacist Chemotherapy Monitoring - Initial Assessment    Anticipated start date: 08/28/21   The following has been reviewed per standard work regarding the patient's treatment regimen: The patient's diagnosis, treatment plan and drug doses, and organ/hematologic function Lab orders and baseline tests specific to treatment regimen  The treatment plan start date, drug sequencing, and pre-medications Prior authorization status  Patient's documented medication list, including drug-drug interaction screen and prescriptions for anti-emetics and supportive care specific to the treatment regimen The drug concentrations, fluid compatibility, administration routes, and timing of the medications to be used The patient's access for treatment and lifetime cumulative dose history, if applicable  The patient's medication allergies and previous infusion related reactions, if applicable   Changes made to treatment plan:  N/A  Follow up needed:  N/A   Cheryl Singh, [facility], 08/21/2021  7:40 AM

## 2021-08-22 ENCOUNTER — Encounter: Payer: Self-pay | Admitting: *Deleted

## 2021-08-22 ENCOUNTER — Other Ambulatory Visit: Payer: Self-pay | Admitting: *Deleted

## 2021-08-22 DIAGNOSIS — C7951 Secondary malignant neoplasm of bone: Secondary | ICD-10-CM

## 2021-08-22 DIAGNOSIS — C679 Malignant neoplasm of bladder, unspecified: Secondary | ICD-10-CM

## 2021-08-22 MED ORDER — ONDANSETRON HCL 8 MG PO TABS: 8.0000 mg | ORAL_TABLET | Freq: Two times a day (BID) | ORAL | 1 refills | Status: DC | PRN

## 2021-08-22 MED ORDER — PROCHLORPERAZINE MALEATE 10 MG PO TABS: 10.0000 mg | ORAL_TABLET | Freq: Four times a day (QID) | ORAL | 1 refills | Status: DC | PRN

## 2021-08-22 MED ORDER — DEXAMETHASONE 4 MG PO TABS: 8.0000 mg | ORAL_TABLET | Freq: Every day | ORAL | 1 refills | Status: DC

## 2021-08-22 MED ORDER — LIDOCAINE-PRILOCAINE 2.5-2.5 % EX CREA: TOPICAL_CREAM | CUTANEOUS | 3 refills | Status: DC

## 2021-08-22 NOTE — Progress Notes (Signed)
Patient calling due to difficult experience and recovery from port placement on Monday. She states the medicine they gave her to "relax" wasn't effective and she felt like the port placement was "difficult". She then stated that yesterday she had increased pain and some nausea. This morning she is much better, but still some soreness.   Explained that her recovery is expected and that pain should be decreasing over the next few days. Instructed her to call the office if she developed increasing pain or new symptoms. She agreed.   Oncology Nurse Navigator Documentation  Oncology Nurse Navigator Flowsheets 08/22/2021  Abnormal Finding Date -  Confirmed Diagnosis Date -  Diagnosis Status -  Navigator Follow Up Date: 08/28/2021  Navigator Follow Up Reason: Chemotherapy  Navigator Location [facility]  Referral Date to RadOnc/MedOnc -  Navigator Encounter Type Telephone  Telephone Symptom Mgt;Incoming Call  Patient Visit Type MedOnc  Treatment Phase Pre-Tx/Tx Discussion  Barriers/Navigation Needs Coordination of Care;Education  Education Pain/ Symptom Management  Interventions Education;Psycho-Social Support  Acuity Level 2-Minimal Needs (1-2 Barriers Identified)  Coordination of Care -  Education Method Verbal  Support Groups/Services Friends and Family  Time Spent with Patient 15

## 2021-08-27 ENCOUNTER — Encounter (HOSPITAL_COMMUNITY): Payer: Self-pay | Admitting: Hematology & Oncology

## 2021-08-27 DIAGNOSIS — C679 Malignant neoplasm of bladder, unspecified: Secondary | ICD-10-CM | POA: Diagnosis not present

## 2021-08-28 ENCOUNTER — Other Ambulatory Visit: Payer: Self-pay

## 2021-08-28 ENCOUNTER — Encounter: Payer: Self-pay | Admitting: *Deleted

## 2021-08-28 ENCOUNTER — Inpatient Hospital Stay: Payer: Medicare PPO

## 2021-08-28 DIAGNOSIS — C7951 Secondary malignant neoplasm of bone: Secondary | ICD-10-CM

## 2021-08-28 DIAGNOSIS — C787 Secondary malignant neoplasm of liver and intrahepatic bile duct: Secondary | ICD-10-CM | POA: Diagnosis not present

## 2021-08-28 DIAGNOSIS — C679 Malignant neoplasm of bladder, unspecified: Secondary | ICD-10-CM

## 2021-08-28 DIAGNOSIS — I10 Essential (primary) hypertension: Secondary | ICD-10-CM | POA: Diagnosis not present

## 2021-08-28 DIAGNOSIS — Z5111 Encounter for antineoplastic chemotherapy: Secondary | ICD-10-CM | POA: Diagnosis not present

## 2021-08-28 DIAGNOSIS — F17210 Nicotine dependence, cigarettes, uncomplicated: Secondary | ICD-10-CM | POA: Diagnosis not present

## 2021-08-28 LAB — CBC WITH DIFFERENTIAL (CANCER CENTER ONLY)
Abs Immature Granulocytes: 0.03 10*3/uL (ref 0.00–0.07)
Basophils Absolute: 0 10*3/uL (ref 0.0–0.1)
Basophils Relative: 1 %
Eosinophils Absolute: 0.1 10*3/uL (ref 0.0–0.5)
Eosinophils Relative: 2 %
HCT: 30.8 % — ABNORMAL LOW (ref 36.0–46.0)
Hemoglobin: 10.2 g/dL — ABNORMAL LOW (ref 12.0–15.0)
Immature Granulocytes: 0 %
Lymphocytes Relative: 14 %
Lymphs Abs: 1.1 10*3/uL (ref 0.7–4.0)
MCH: 32.2 pg (ref 26.0–34.0)
MCHC: 33.1 g/dL (ref 30.0–36.0)
MCV: 97.2 fL (ref 80.0–100.0)
Monocytes Absolute: 1.1 10*3/uL — ABNORMAL HIGH (ref 0.1–1.0)
Monocytes Relative: 15 %
Neutro Abs: 5.1 10*3/uL (ref 1.7–7.7)
Neutrophils Relative %: 68 %
Platelet Count: 331 10*3/uL (ref 150–400)
RBC: 3.17 MIL/uL — ABNORMAL LOW (ref 3.87–5.11)
RDW: 12.3 % (ref 11.5–15.5)
WBC Count: 7.5 10*3/uL (ref 4.0–10.5)
nRBC: 0 % (ref 0.0–0.2)

## 2021-08-28 LAB — CMP (CANCER CENTER ONLY)
ALT: 7 U/L (ref 0–44)
AST: 9 U/L — ABNORMAL LOW (ref 15–41)
Albumin: 3.4 g/dL — ABNORMAL LOW (ref 3.5–5.0)
Alkaline Phosphatase: 316 U/L — ABNORMAL HIGH (ref 38–126)
Anion gap: 10 (ref 5–15)
BUN: 12 mg/dL (ref 8–23)
CO2: 25 mmol/L (ref 22–32)
Calcium: 8.8 mg/dL — ABNORMAL LOW (ref 8.9–10.3)
Chloride: 96 mmol/L — ABNORMAL LOW (ref 98–111)
Creatinine: 0.77 mg/dL (ref 0.44–1.00)
GFR, Estimated: >60 mL/min (ref >60)
Glucose, Bld: 100 mg/dL — ABNORMAL HIGH (ref 70–99)
Potassium: 4.2 mmol/L (ref 3.5–5.1)
Sodium: 131 mmol/L — ABNORMAL LOW (ref 135–145)
Total Bilirubin: 0.5 mg/dL (ref 0.3–1.2)
Total Protein: 6.4 g/dL — ABNORMAL LOW (ref 6.5–8.1)

## 2021-08-28 LAB — LACTATE DEHYDROGENASE: LDH: 141 U/L (ref 98–192)

## 2021-08-28 MED ORDER — SODIUM CHLORIDE 0.9 % IV SOLN
150.0000 mg | Freq: Once | INTRAVENOUS | Status: AC
  Administered 2021-08-28: 150 mg via INTRAVENOUS
  Filled 2021-08-28: qty 150

## 2021-08-28 MED ORDER — SODIUM CHLORIDE 0.9 % IV SOLN
70.0000 mg/m2 | Freq: Once | INTRAVENOUS | Status: AC
  Administered 2021-08-28: 130 mg via INTRAVENOUS
  Filled 2021-08-28: qty 130

## 2021-08-28 MED ORDER — SODIUM CHLORIDE 0.9 % IV SOLN
1000.0000 mg/m2 | Freq: Once | INTRAVENOUS | Status: AC
  Administered 2021-08-28: 1862 mg via INTRAVENOUS
  Filled 2021-08-28: qty 48.97

## 2021-08-28 MED ORDER — POTASSIUM CHLORIDE IN NACL 20-0.9 MEQ/L-% IV SOLN
Freq: Once | INTRAVENOUS | Status: AC
  Filled 2021-08-28: qty 1000

## 2021-08-28 MED ORDER — PALONOSETRON HCL INJECTION 0.25 MG/5ML
0.2500 mg | Freq: Once | INTRAVENOUS | Status: AC
  Administered 2021-08-28: 0.25 mg via INTRAVENOUS
  Filled 2021-08-28: qty 5

## 2021-08-28 MED ORDER — SODIUM CHLORIDE 0.9% FLUSH
10.0000 mL | INTRAVENOUS | Status: DC | PRN
  Administered 2021-08-28: 10 mL

## 2021-08-28 MED ORDER — SODIUM CHLORIDE 0.9 % IV SOLN: Freq: Once | INTRAVENOUS | Status: AC

## 2021-08-28 MED ORDER — SODIUM CHLORIDE 0.9 % IV SOLN
10.0000 mg | Freq: Once | INTRAVENOUS | Status: AC
  Administered 2021-08-28: 10 mg via INTRAVENOUS
  Filled 2021-08-28: qty 10

## 2021-08-28 MED ORDER — MAGNESIUM SULFATE 2 GM/50ML IV SOLN
2.0000 g | Freq: Once | INTRAVENOUS | Status: AC
  Administered 2021-08-28: 2 g via INTRAVENOUS
  Filled 2021-08-28: qty 50

## 2021-08-28 MED ORDER — HEPARIN SOD (PORK) LOCK FLUSH 100 UNIT/ML IV SOLN
500.0000 [IU] | Freq: Once | INTRAVENOUS | Status: AC | PRN
  Administered 2021-08-28: 500 [IU]

## 2021-08-28 NOTE — Progress Notes (Signed)
New portacath.  Tip noted in SVC prior to drawing labs.

## 2021-08-28 NOTE — Progress Notes (Signed)
Reviewed labwork with Dr Marin Olp.  Ok to treat today per Dr Tawana Scale to give Cisplatin with 150 cc urine per Dr Marin Olp

## 2021-08-28 NOTE — Progress Notes (Signed)
Ok to run NaCl with KCl concomitant with Cisplatin per Dr. Marin Olp.

## 2021-08-28 NOTE — Progress Notes (Signed)
Met with patient in infusion. She is beginning her treatment after her chemo education this morning. She is ready to start treatment, but still a little apprehensive.   Her port is feeling much better. She states the insertion was rough and "it shouldn't have been like that". Her access today was pain-free.   She knows that I will call her tomorrow to check in and see how she's doing. She is also encouraged to call with any questions or concerns.   Oncology Nurse Navigator Documentation  Oncology Nurse Navigator Flowsheets 08/28/2021  Abnormal Finding Date -  Confirmed Diagnosis Date -  Diagnosis Status -  Planned Course of Treatment Chemotherapy  Phase of Treatment Chemo  Chemotherapy Actual Start Date: 08/28/2021  Chemotherapy Expected End Date: 12/11/2021  Navigator Follow Up Date: 08/29/2021  Navigator Follow Up Reason: Patient Call  Navigator Location [facility]  Referral Date to RadOnc/MedOnc -  Navigator Encounter Type Treatment  Telephone -  Treatment Initiated Date 08/28/2021  Patient Visit Type MedOnc  Treatment Phase First Chemo Tx  Barriers/Navigation Needs Coordination of Care;Education  Education -  Interventions Psycho-Social Support  Acuity Level 2-Minimal Needs (1-2 Barriers Identified)  Coordination of Care -  Education Method -  Support Groups/Services Friends and Family  Time Spent with Patient 30

## 2021-08-28 NOTE — Patient Instructions (Signed)
[facility]  Discharge Instructions: °Thank you for choosing [facility] to provide your oncology and hematology care.  ° °If you have a lab appointment with the Cancer Center, please go directly to the Cancer Center and check in at the registration area. ° °Wear comfortable clothing and clothing appropriate for easy access to any Portacath or PICC line.  ° °We strive to give you quality time with your provider. You may need to reschedule your appointment if you arrive late (15 or more minutes).  Arriving late affects you and other patients whose appointments are after yours.  Also, if you miss three or more appointments without notifying the office, you may be dismissed from the clinic at the provider’s discretion.    °  °For prescription refill requests, have your pharmacy contact our office and allow 72 hours for refills to be completed.   ° °Today you received the following chemotherapy and/or immunotherapy agents Cisplatin, Gemzar  °  °To help prevent nausea and vomiting after your treatment, we encourage you to take your nausea medication as directed. ° °BELOW ARE SYMPTOMS THAT SHOULD BE REPORTED IMMEDIATELY: °*FEVER GREATER THAN 100.4 F (38 °C) OR HIGHER °*CHILLS OR SWEATING °*NAUSEA AND VOMITING THAT IS NOT CONTROLLED WITH YOUR NAUSEA MEDICATION °*UNUSUAL SHORTNESS OF BREATH °*UNUSUAL BRUISING OR BLEEDING °*URINARY PROBLEMS (pain or burning when urinating, or frequent urination) °*BOWEL PROBLEMS (unusual diarrhea, constipation, pain near the anus) °TENDERNESS IN MOUTH AND THROAT WITH OR WITHOUT PRESENCE OF ULCERS (sore throat, sores in mouth, or a toothache) °UNUSUAL RASH, SWELLING OR PAIN  °UNUSUAL VAGINAL DISCHARGE OR ITCHING  ° °Items with * indicate a potential emergency and should be followed up as soon as possible or go to the Emergency Department if any problems should occur. ° °Please show the CHEMOTHERAPY ALERT CARD or IMMUNOTHERAPY ALERT CARD at check-in to  the Emergency Department and triage nurse. °Should you have questions after your visit or need to cancel or reschedule your appointment, please contact [facility]  [phone] and follow the prompts.  Office hours are 8:00 a.m. to 4:30 p.m. Monday - Friday. Please note that voicemails left after 4:00 p.m. may not be returned until the following business day.  We are closed weekends and major holidays. You have access to a nurse at all times for urgent questions. Please call the main number to the clinic [phone] and follow the prompts. ° °For any non-urgent questions, you may also contact your provider using MyChart. We now offer e-Visits for anyone 18 and older to request care online for non-urgent symptoms. For details visit [email or web address]. °  °Also download the MyChart app! Go to the app store, search "MyChart", open the app, select [facility], and log in with your MyChart username and password. ° °Due to Covid, a mask is required upon entering the hospital/clinic. If you do not have a mask, one will be given to you upon arrival. For doctor visits, patients may have 1 support person aged 18 or older with them. For treatment visits, patients cannot have anyone with them due to current Covid guidelines and our immunocompromised population.  °

## 2021-08-28 NOTE — Progress Notes (Signed)
Patient in chemotherapy education class with friend [name].  Patient is here for Day 1 of chemotherapy.  Discussed side effects of Cisplatin and Gemzar   which include but are not limited to myelosuppression, decreased appetite, fatigue, fever, allergic or infusional reaction, mucositis, cardiac toxicity, cough, SOB, altered taste, nausea and vomiting, diarrhea, constipation, elevated LFTs myalgia and arthralgias, hair loss or thinning, rash, skin dryness, nail changes, peripheral neuropathy, discolored urine, delayed wound healing, mental changes (Chemo brain), increased risk of infections, weight loss.  Reviewed infusion room and office policy and procedure and phone numbers 24 hours x 7 days a week.  Reviewed how to manage safe handling at home.  Reviewed when to call the office with any concerns or problems.  Scientist, clinical (histocompatibility and immunogenetics) given.  Discussed portacath insertion and EMLA cream administration.  Antiemetic protocol and chemotherapy schedule reviewed. Patient verbalized understanding of chemotherapy indications and possible side effects.  Teachback done

## 2021-08-29 ENCOUNTER — Encounter: Payer: Self-pay | Admitting: *Deleted

## 2021-08-29 NOTE — Progress Notes (Signed)
Called patient after her first treatment yesterday. She states she is doing well. She has eaten well, with some mild queasiness this morning that went away after she took her scheduled decadron. She states she slept well. She has mild full body aches.  ? ?Reviewed her medications. She knows to take compazine for nausea until Friday when she can also start Zofran. She knows to take her decadron until Friday.  ? ?Reviewed the on-call service. She knows to call with any questions or concerns. She will have a PET on Friday and next treatment day on 09/04/21. ? ?Oncology Nurse Navigator Documentation ? ?Oncology Nurse Navigator Flowsheets 08/29/2021  ?Abnormal Finding Date -  ?Confirmed Diagnosis Date -  ?Diagnosis Status -  ?Planned Course of Treatment -  ?Phase of Treatment -  ?Chemotherapy Actual Start Date: -  ?Chemotherapy Expected End Date: -  ?Navigator Follow Up Date: 09/04/2021  ?Navigator Follow Up Reason: Scan Review  ?Navigator Location [facility]  ?Referral Date to RadOnc/MedOnc -  ?Navigator Encounter Type Telephone  ?Telephone -  ?Treatment Initiated Date -  ?Patient Visit Type MedOnc  ?Treatment Phase Active Tx  ?Barriers/Navigation Needs Coordination of Care;Education  ?Education Pain/ Symptom Management  ?Interventions Education;Psycho-Social Support  ?Acuity Level 2-Minimal Needs (1-2 Barriers Identified)  ?Coordination of Care -  ?Education Method Verbal  ?Support Groups/Services Friends and Family  ?Time Spent with Patient 30  ?  ?

## 2021-08-31 ENCOUNTER — Encounter (HOSPITAL_COMMUNITY)
Admission: RE | Admit: 2021-08-31 | Discharge: 2021-08-31 | Disposition: A | Discharge status: Home / Self Care | Payer: Medicare PPO | Source: Ambulatory Visit | Attending: Hematology & Oncology | Admitting: Hematology & Oncology

## 2021-08-31 ENCOUNTER — Other Ambulatory Visit: Payer: Self-pay

## 2021-08-31 DIAGNOSIS — C679 Malignant neoplasm of bladder, unspecified: Secondary | ICD-10-CM | POA: Diagnosis not present

## 2021-08-31 DIAGNOSIS — K5730 Diverticulosis of large intestine without perforation or abscess without bleeding: Secondary | ICD-10-CM | POA: Diagnosis not present

## 2021-08-31 DIAGNOSIS — I700 Atherosclerosis of aorta: Secondary | ICD-10-CM | POA: Diagnosis not present

## 2021-08-31 DIAGNOSIS — C787 Secondary malignant neoplasm of liver and intrahepatic bile duct: Secondary | ICD-10-CM | POA: Insufficient documentation

## 2021-08-31 DIAGNOSIS — C7951 Secondary malignant neoplasm of bone: Secondary | ICD-10-CM | POA: Insufficient documentation

## 2021-08-31 LAB — GLUCOSE, CAPILLARY: Glucose-Capillary: 116 mg/dL — ABNORMAL HIGH (ref 70–99)

## 2021-08-31 MED ORDER — FLUDEOXYGLUCOSE F - 18 (FDG) INJECTION
8.2000 | Freq: Once | INTRAVENOUS | Status: AC
  Administered 2021-08-31: 8.2 via INTRAVENOUS

## 2021-09-03 ENCOUNTER — Other Ambulatory Visit: Payer: Self-pay | Admitting: Hematology & Oncology

## 2021-09-03 ENCOUNTER — Telehealth: Payer: Self-pay | Admitting: *Deleted

## 2021-09-03 ENCOUNTER — Other Ambulatory Visit: Payer: Self-pay | Admitting: *Deleted

## 2021-09-03 MED ORDER — OXYCODONE-ACETAMINOPHEN 7.5-325 MG PO TABS: ORAL_TABLET | ORAL | 0 refills | Status: DC

## 2021-09-03 NOTE — Telephone Encounter (Signed)
Call received from patient stating that the last couple of days she has began experiencing new pain to her mid to lower back and bilateral hips that is not relieved by Ibuprofen or Tramadol.  Dr. Marin Olp notified and order received for pt to take Percocet 7/325 mg one to two tablets every six hours as needed for pain.  Pt notified of MD order and is appreciative of assistance. ?

## 2021-09-04 ENCOUNTER — Other Ambulatory Visit: Payer: Self-pay

## 2021-09-04 ENCOUNTER — Encounter: Payer: Self-pay | Admitting: *Deleted

## 2021-09-04 ENCOUNTER — Inpatient Hospital Stay: Payer: Medicare PPO

## 2021-09-04 VITALS — BP 154/86 | HR 108 | Temp 97.8°F | Resp 19

## 2021-09-04 VITALS — BP 138/60 | HR 86 | Temp 97.6°F | Resp 18

## 2021-09-04 DIAGNOSIS — Z5111 Encounter for antineoplastic chemotherapy: Secondary | ICD-10-CM | POA: Diagnosis not present

## 2021-09-04 DIAGNOSIS — C787 Secondary malignant neoplasm of liver and intrahepatic bile duct: Secondary | ICD-10-CM

## 2021-09-04 DIAGNOSIS — Z510 Encounter for antineoplastic radiation therapy: Secondary | ICD-10-CM | POA: Insufficient documentation

## 2021-09-04 DIAGNOSIS — C7951 Secondary malignant neoplasm of bone: Secondary | ICD-10-CM | POA: Insufficient documentation

## 2021-09-04 DIAGNOSIS — C775 Secondary and unspecified malignant neoplasm of intrapelvic lymph nodes: Secondary | ICD-10-CM | POA: Diagnosis not present

## 2021-09-04 DIAGNOSIS — C679 Malignant neoplasm of bladder, unspecified: Secondary | ICD-10-CM

## 2021-09-04 LAB — CBC WITH DIFFERENTIAL (CANCER CENTER ONLY)
Abs Immature Granulocytes: 0.04 10*3/uL (ref 0.00–0.07)
Basophils Absolute: 0 10*3/uL (ref 0.0–0.1)
Basophils Relative: 0 %
Eosinophils Absolute: 0.2 10*3/uL (ref 0.0–0.5)
Eosinophils Relative: 3 %
HCT: 29.1 % — ABNORMAL LOW (ref 36.0–46.0)
Hemoglobin: 10 g/dL — ABNORMAL LOW (ref 12.0–15.0)
Immature Granulocytes: 1 %
Lymphocytes Relative: 11 %
Lymphs Abs: 0.7 10*3/uL (ref 0.7–4.0)
MCH: 32.4 pg (ref 26.0–34.0)
MCHC: 34.4 g/dL (ref 30.0–36.0)
MCV: 94.2 fL (ref 80.0–100.0)
Monocytes Absolute: 0.4 10*3/uL (ref 0.1–1.0)
Monocytes Relative: 6 %
Neutro Abs: 4.8 10*3/uL (ref 1.7–7.7)
Neutrophils Relative %: 79 %
Platelet Count: 191 10*3/uL (ref 150–400)
RBC: 3.09 MIL/uL — ABNORMAL LOW (ref 3.87–5.11)
RDW: 12.2 % (ref 11.5–15.5)
WBC Count: 6 10*3/uL (ref 4.0–10.5)
nRBC: 0 % (ref 0.0–0.2)

## 2021-09-04 LAB — CMP (CANCER CENTER ONLY)
ALT: 24 U/L (ref 0–44)
AST: 15 U/L (ref 15–41)
Albumin: 3.3 g/dL — ABNORMAL LOW (ref 3.5–5.0)
Alkaline Phosphatase: 233 U/L — ABNORMAL HIGH (ref 38–126)
Anion gap: 10 (ref 5–15)
BUN: 14 mg/dL (ref 8–23)
CO2: 28 mmol/L (ref 22–32)
Calcium: 8.7 mg/dL — ABNORMAL LOW (ref 8.9–10.3)
Chloride: 93 mmol/L — ABNORMAL LOW (ref 98–111)
Creatinine: 0.61 mg/dL (ref 0.44–1.00)
GFR, Estimated: >60 mL/min (ref >60)
Glucose, Bld: 114 mg/dL — ABNORMAL HIGH (ref 70–99)
Potassium: 3.7 mmol/L (ref 3.5–5.1)
Sodium: 131 mmol/L — ABNORMAL LOW (ref 135–145)
Total Bilirubin: 0.6 mg/dL (ref 0.3–1.2)
Total Protein: 6.1 g/dL — ABNORMAL LOW (ref 6.5–8.1)

## 2021-09-04 MED ORDER — PROCHLORPERAZINE MALEATE 10 MG PO TABS
10.0000 mg | ORAL_TABLET | Freq: Once | ORAL | Status: AC
  Administered 2021-09-04: 10 mg via ORAL
  Filled 2021-09-04: qty 1

## 2021-09-04 MED ORDER — SODIUM CHLORIDE 0.9 % IV SOLN
1000.0000 mg/m2 | Freq: Once | INTRAVENOUS | Status: AC
  Administered 2021-09-04: 1862 mg via INTRAVENOUS
  Filled 2021-09-04: qty 48.97

## 2021-09-04 MED ORDER — SODIUM CHLORIDE 0.9 % IV SOLN: Freq: Once | INTRAVENOUS | Status: AC

## 2021-09-04 MED ORDER — FAMOTIDINE 40 MG PO TABS: 40.0000 mg | ORAL_TABLET | Freq: Two times a day (BID) | ORAL | 4 refills | Status: AC

## 2021-09-04 MED ORDER — CELECOXIB 200 MG PO CAPS: 200.0000 mg | ORAL_CAPSULE | Freq: Two times a day (BID) | ORAL | 4 refills | Status: DC

## 2021-09-04 MED ORDER — SODIUM CHLORIDE 0.9% FLUSH
10.0000 mL | Freq: Once | INTRAVENOUS | Status: AC
  Administered 2021-09-04: 10 mL

## 2021-09-04 MED ORDER — ZOLEDRONIC ACID 4 MG/100ML IV SOLN
4.0000 mg | Freq: Once | INTRAVENOUS | Status: AC
  Administered 2021-09-04: 4 mg via INTRAVENOUS
  Filled 2021-09-04: qty 100

## 2021-09-04 MED ORDER — HEPARIN SOD (PORK) LOCK FLUSH 100 UNIT/ML IV SOLN
500.0000 [IU] | Freq: Once | INTRAVENOUS | Status: AC | PRN
  Administered 2021-09-04: 500 [IU]

## 2021-09-04 MED ORDER — SODIUM CHLORIDE 0.9% FLUSH
10.0000 mL | INTRAVENOUS | Status: DC | PRN
  Administered 2021-09-04: 10 mL

## 2021-09-04 MED ORDER — SODIUM CHLORIDE 0.9 % IV SOLN: Freq: Once | INTRAVENOUS | Status: DC

## 2021-09-04 NOTE — Patient Instructions (Signed)

## 2021-09-04 NOTE — Progress Notes (Signed)
Oncology Nurse Navigator Documentation ? ?Oncology Nurse Navigator Flowsheets 09/04/2021  ?Abnormal Finding Date -  ?Confirmed Diagnosis Date -  ?Diagnosis Status -  ?Planned Course of Treatment -  ?Phase of Treatment -  ?Chemotherapy Actual Start Date: -  ?Chemotherapy Expected End Date: -  ?Navigator Follow Up Date: 09/18/2021  ?Navigator Follow Up Reason: Follow-up Appointment;Chemotherapy  ?Navigator Location [facility]  ?Referral Date to RadOnc/MedOnc -  ?Navigator Encounter Type Scan Review  ?Telephone -  ?Treatment Initiated Date -  ?Patient Visit Type MedOnc  ?Treatment Phase Active Tx  ?Barriers/Navigation Needs Coordination of Care;Education  ?Education -  ?Interventions None Required  ?Acuity Level 2-Minimal Needs (1-2 Barriers Identified)  ?Coordination of Care -  ?Education Method -  ?Support Groups/Services Friends and Family  ?Time Spent with Patient 15  ?  ?

## 2021-09-04 NOTE — Addendum Note (Signed)
Addended by: Burney Gauze R on: 09/04/2021 11:50 AM ? ? Modules accepted: Orders ? ?

## 2021-09-04 NOTE — Patient Instructions (Signed)
Gemcitabine injection What is this medication? GEMCITABINE (jem SYE ta been) is a chemotherapy drug. This medicine is used to treat many types of cancer like breast cancer, lung cancer, pancreatic cancer, and ovarian cancer. This medicine may be used for other purposes; ask your health care provider or pharmacist if you have questions. COMMON BRAND NAME(S): Gemzar, Infugem What should I tell my care team before I take this medication? They need to know if you have any of these conditions: blood disorders infection kidney disease liver disease lung or breathing disease, like asthma recent or ongoing radiation therapy an unusual or allergic reaction to gemcitabine, other chemotherapy, other medicines, foods, dyes, or preservatives pregnant or trying to get pregnant breast-feeding How should I use this medication? This drug is given as an infusion into a vein. It is administered in a hospital or clinic by a specially trained health care professional. Talk to your pediatrician regarding the use of this medicine in children. Special care may be needed. Overdosage: If you think you have taken too much of this medicine contact a poison control center or emergency room at once. NOTE: This medicine is only for you. Do not share this medicine with others. What if I miss a dose? It is important not to miss your dose. Call your doctor or health care professional if you are unable to keep an appointment. What may interact with this medication? medicines to increase blood counts like filgrastim, pegfilgrastim, sargramostim some other chemotherapy drugs like cisplatin vaccines Talk to your doctor or health care professional before taking any of these medicines: acetaminophen aspirin ibuprofen ketoprofen naproxen This list may not describe all possible interactions. Give your health care provider a list of all the medicines, herbs, non-prescription drugs, or dietary supplements you use. Also tell  them if you smoke, drink alcohol, or use illegal drugs. Some items may interact with your medicine. What should I watch for while using this medication? Visit your doctor for checks on your progress. This drug may make you feel generally unwell. This is not uncommon, as chemotherapy can affect healthy cells as well as cancer cells. Report any side effects. Continue your course of treatment even though you feel ill unless your doctor tells you to stop. In some cases, you may be given additional medicines to help with side effects. Follow all directions for their use. Call your doctor or health care professional for advice if you get a fever, chills or sore throat, or other symptoms of a cold or flu. Do not treat yourself. This drug decreases your body's ability to fight infections. Try to avoid being around people who are sick. This medicine may increase your risk to bruise or bleed. Call your doctor or health care professional if you notice any unusual bleeding. Be careful brushing and flossing your teeth or using a toothpick because you may get an infection or bleed more easily. If you have any dental work done, tell your dentist you are receiving this medicine. Avoid taking products that contain aspirin, acetaminophen, ibuprofen, naproxen, or ketoprofen unless instructed by your doctor. These medicines may hide a fever. Do not become pregnant while taking this medicine or for 6 months after stopping it. Women should inform their doctor if they wish to become pregnant or think they might be pregnant. Men should not father a child while taking this medicine and for 3 months after stopping it. There is a potential for serious side effects to an unborn child. Talk to your health care professional  or pharmacist for more information. Do not breast-feed an infant while taking this medicine or for at least 1 week after stopping it. Men should inform their doctors if they wish to father a child. This medicine may  lower sperm counts. Talk with your doctor or health care professional if you are concerned about your fertility. What side effects may I notice from receiving this medication? Side effects that you should report to your doctor or health care professional as soon as possible: allergic reactions like skin rash, itching or hives, swelling of the face, lips, or tongue breathing problems pain, redness, or irritation at site where injected signs and symptoms of a dangerous change in heartbeat or heart rhythm like chest pain; dizziness; fast or irregular heartbeat; palpitations; feeling faint or lightheaded, falls; breathing problems signs of decreased platelets or bleeding - bruising, pinpoint red spots on the skin, black, tarry stools, blood in the urine signs of decreased red blood cells - unusually weak or tired, feeling faint or lightheaded, falls signs of infection - fever or chills, cough, sore throat, pain or difficulty passing urine signs and symptoms of kidney injury like trouble passing urine or change in the amount of urine signs and symptoms of liver injury like dark yellow or brown urine; general ill feeling or flu-like symptoms; light-colored stools; loss of appetite; nausea; right upper belly pain; unusually weak or tired; yellowing of the eyes or skin swelling of ankles, feet, hands Side effects that usually do not require medical attention (report to your doctor or health care professional if they continue or are bothersome): constipation diarrhea hair loss loss of appetite nausea rash vomiting This list may not describe all possible side effects. Call your doctor for medical advice about side effects. You may report side effects to FDA at 1-800-FDA-1088. Where should I keep my medication? This drug is given in a hospital or clinic and will not be stored at home. NOTE: This sheet is a summary. It may not cover all possible information. If you have questions about this medicine,  talk to your doctor, pharmacist, or health care provider.  2022 Elsevier/Gold Standard (2017-09-10 00:00:00)    Zoledronic Acid Injection (Hypercalcemia, Oncology) What is this medication? ZOLEDRONIC ACID (ZOE le dron ik AS id) slows calcium loss from bones. It high calcium levels in the blood from some kinds of cancer. It may be used in other people at risk for bone loss. This medicine may be used for other purposes; ask your health care provider or pharmacist if you have questions. COMMON BRAND NAME(S): Zometa What should I tell my care team before I take this medication? They need to know if you have any of these conditions: cancer dehydration dental disease kidney disease liver disease low levels of calcium in the blood lung or breathing disease (asthma) receiving steroids like dexamethasone or prednisone an unusual or allergic reaction to zoledronic acid, other medicines, foods, dyes, or preservatives pregnant or trying to get pregnant breast-feeding How should I use this medication? This drug is injected into a vein. It is given by a health care provider in a hospital or clinic setting. Talk to your health care provider about the use of this drug in children. Special care may be needed. Overdosage: If you think you have taken too much of this medicine contact a poison control center or emergency room at once. NOTE: This medicine is only for you. Do not share this medicine with others. What if I miss a dose? Keep appointments for  follow-up doses. It is important not to miss your dose. Call your health care provider if you are unable to keep an appointment. What may interact with this medication? certain antibiotics given by injection NSAIDs, medicines for pain and inflammation, like ibuprofen or naproxen some diuretics like bumetanide, furosemide teriparatide thalidomide This list may not describe all possible interactions. Give your health care provider a list of all the  medicines, herbs, non-prescription drugs, or dietary supplements you use. Also tell them if you smoke, drink alcohol, or use illegal drugs. Some items may interact with your medicine. What should I watch for while using this medication? Visit your health care provider for regular checks on your progress. It may be some time before you see the benefit from this drug. Some people who take this drug have severe bone, joint, or muscle pain. This drug may also increase your risk for jaw problems or a broken thigh bone. Tell your health care provider right away if you have severe pain in your jaw, bones, joints, or muscles. Tell you health care provider if you have any pain that does not go away or that gets worse. Tell your dentist and dental surgeon that you are taking this drug. You should not have major dental surgery while on this drug. See your dentist to have a dental exam and fix any dental problems before starting this drug. Take good care of your teeth while on this drug. Make sure you see your dentist for regular follow-up appointments. You should make sure you get enough calcium and vitamin D while you are taking this drug. Discuss the foods you eat and the vitamins you take with your health care provider. Check with your health care provider if you have severe diarrhea, nausea, and vomiting, or if you sweat a lot. The loss of too much body fluid may make it dangerous for you to take this drug. You may need blood work done while you are taking this drug. Do not become pregnant while taking this drug. Women should inform their health care provider if they wish to become pregnant or think they might be pregnant. There is potential for serious harm to an unborn child. Talk to your health care provider for more information. What side effects may I notice from receiving this medication? Side effects that you should report to your doctor or health care provider as soon as possible: allergic reactions  (skin rash, itching or hives; swelling of the face, lips, or tongue) bone pain infection (fever, chills, cough, sore throat, pain or trouble passing urine) jaw pain, especially after dental work joint pain kidney injury (trouble passing urine or change in the amount of urine) low blood pressure (dizziness; feeling faint or lightheaded, falls; unusually weak or tired) low calcium levels (fast heartbeat; muscle cramps or pain; pain, tingling, or numbness in the hands or feet; seizures) low magnesium levels (fast, irregular heartbeat; muscle cramp or pain; muscle weakness; tremors; seizures) low red blood cell counts (trouble breathing; feeling faint; lightheaded, falls; unusually weak or tired) muscle pain redness, blistering, peeling, or loosening of the skin, including inside the mouth severe diarrhea swelling of the ankles, feet, hands trouble breathing Side effects that usually do not require medical attention (report to your doctor or health care provider if they continue or are bothersome): anxious constipation coughing depressed mood eye irritation, itching, or pain fever general ill feeling or flu-like symptoms nausea pain, redness, or irritation at site where injected trouble sleeping This list may not describe  all possible side effects. Call your doctor for medical advice about side effects. You may report side effects to FDA at 1-800-FDA-1088. Where should I keep my medication? This drug is given in a hospital or clinic. It will not be stored at home. NOTE: This sheet is a summary. It may not cover all possible information. If you have questions about this medicine, talk to your doctor, pharmacist, or health care provider.  2022 Elsevier/Gold Standard (2021-03-06 00:00:00)

## 2021-09-04 NOTE — Progress Notes (Signed)
Patient states she started having pain in her lower back and left ribs a couple of days ago. She was unable to tolerate oxycodone as this caused nausea and vomiting. She states she took 1000 mg of Ibuprofen this am in which she was relieved of pain. Dr. Marin Olp notified. Prescription for celebrex and famotidine sent into pharmacy per Dr. Marin Olp. Patient informed of prescriptions sent in and verbalized understanding. Patient verbalized understanding she is to notify our office if pain does not resolve with this pain management regimen or if any other concerns or complaints arise.  ?

## 2021-09-07 ENCOUNTER — Telehealth: Payer: Self-pay | Admitting: *Deleted

## 2021-09-07 NOTE — Telephone Encounter (Signed)
Call received from [place] patient's friend to notify Dr. Marin Olp that pt.'s pain continues despite taking Famotidine and Celebrex twice a day.  Instructed [name] to have pt try Oxycodone 7.5/325 mg with a full stomach to see if pt can tolerate it with no nausea or vomiting.  Informed [name] that I would call back to check on patient.  ?

## 2021-09-07 NOTE — Telephone Encounter (Signed)
Call placed back to [facility] at 2:00PM to check on patient's status. [name] states that pt did take one Oxycodone 7.5/325 mg after lunch and is asleep now.  [name] instructed per order of Dr. Marin Olp that if Oxycodone does not relieve pt.'s pain over the weekend to go to the [facility].  Pt states that she will take pt to the [facility] ER if pain is not controlled over the weekend and is appreciative of assistance.  ? ?

## 2021-09-11 ENCOUNTER — Encounter: Payer: Self-pay | Admitting: *Deleted

## 2021-09-11 ENCOUNTER — Telehealth: Payer: Self-pay

## 2021-09-11 DIAGNOSIS — C679 Malignant neoplasm of bladder, unspecified: Secondary | ICD-10-CM

## 2021-09-11 NOTE — Progress Notes (Signed)
Received a call from patient's friend/caregiver, [name]. She states the patient is having continued pain. Carole is comfortable when still, lying in bed, but as soon as she begins to move her pain becomes excruciating. She needs assistance to move and perform any ADLs.  ? ?Pain started Saturday morning after starting chemo and having her PET the week before. She is taking the oxy, celebrex and pepcid as prescribed. Her pain is in her back and ribs.  ? ?[name] mentions a referral to radiation but they have not heard anything yet. I spoke to Dr Marin Olp and he has spoken to Dr Sondra Come. I went ahead and placed a referral order.  ? ?[name] also asked if Palliative Care might help in this situation. I agreed that palliative care can be helpful with symptom management and home management. Referral order placed.  ? ?Spoke with Dr Marin Olp regarding her pain. He states that radiation will help and that she needs to continue her medication regimen. He states that if her pain is uncontrolled that she needs to go to the hospital for pain control.  ? ?Oncology Nurse Navigator Documentation ? ?Oncology Nurse Navigator Flowsheets 09/11/2021  ?Abnormal Finding Date -  ?Confirmed Diagnosis Date -  ?Diagnosis Status -  ?Planned Course of Treatment -  ?Phase of Treatment -  ?Chemotherapy Actual Start Date: -  ?Chemotherapy Expected End Date: -  ?Navigator Follow Up Date: 09/18/2021  ?Navigator Follow Up Reason: Follow-up Appointment;Chemotherapy  ?Navigator Location [facility]  ?Referral Date to RadOnc/MedOnc -  ?Navigator Encounter Type Telephone  ?Telephone -  ?Treatment Initiated Date -  ?Patient Visit Type MedOnc  ?Treatment Phase Active Tx  ?Barriers/Navigation Needs Coordination of Care;Education  ?Education Other  ?Interventions Coordination of Care;Education;Psycho-Social Support;Referrals  ?Acuity Level 2-Minimal Needs (1-2 Barriers Identified)  ?Referrals Palliative Care  ?Coordination of Care -  ?Education Method Verbal  ?Support  Groups/Services Friends and Family  ?Time Spent with Patient 30  ?  ?

## 2021-09-11 NOTE — Telephone Encounter (Signed)
Our office received a referral for Cheryl Singh to help with symptom management needs. I called Cheryl Singh to introduce myself and Palliative Care's role on her team. She was agreeable to meeting with our office on March 16th at [facility] via [place]. She stated that her pain is worse when standing and trying to use a walker. She stated that her pain medication helps for a little while, but then wears off. All questions answered.  ?

## 2021-09-12 ENCOUNTER — Encounter: Payer: Self-pay | Admitting: *Deleted

## 2021-09-12 NOTE — Progress Notes (Signed)
Upon arrival to work, messages were left by patient's friend [name], stating that they were going to bring her to the ED this morning.  ? ?Received a call from [place] at 1045 stating that patient still wasn't sure about going to the hospital She and a friend had several questions they wanted answered about pain control, admission process, referrals, prognosis, and other side effects. Answered to their satisfaction. [name] felt like hospitalization would be best and stated she would talk to patient and have her brought to [facility].  ? ?Received a call back about 45 minutes later stating that the patient would like to "wrap her mind around admission" and they will not be bringing her until tomorrow afternoon. [name] asks for the name of an ambulance company they can call not urgently tomorrow for transport. Gave her the number [phone] for [facility].  ? ?Oncology Nurse Navigator Documentation ? ?Oncology Nurse Navigator Flowsheets 09/12/2021  ?Abnormal Finding Date -  ?Confirmed Diagnosis Date -  ?Diagnosis Status -  ?Planned Course of Treatment -  ?Phase of Treatment -  ?Chemotherapy Actual Start Date: -  ?Chemotherapy Expected End Date: -  ?Navigator Follow Up Date: 09/18/2021  ?Navigator Follow Up Reason: Follow-up Appointment;Chemotherapy  ?Navigator Location [facility]  ?Referral Date to RadOnc/MedOnc -  ?Navigator Encounter Type Telephone  ?Telephone -  ?Treatment Initiated Date -  ?Patient Visit Type MedOnc  ?Treatment Phase Active Tx  ?Barriers/Navigation Needs Coordination of Care;Education  ?Education Pain/ Symptom Management;Other  ?Interventions Education;Psycho-Social Support  ?Acuity Level 2-Minimal Needs (1-2 Barriers Identified)  ?Referrals -  ?Coordination of Care -  ?Education Method Verbal  ?Support Groups/Services Friends and Family  ?Time Spent with Patient 30  ?  ?

## 2021-09-13 ENCOUNTER — Encounter: Payer: Self-pay | Admitting: Nurse Practitioner

## 2021-09-13 ENCOUNTER — Encounter: Payer: Self-pay | Admitting: *Deleted

## 2021-09-13 ENCOUNTER — Inpatient Hospital Stay (HOSPITAL_BASED_OUTPATIENT_CLINIC_OR_DEPARTMENT_OTHER): Payer: Medicare PPO | Admitting: Nurse Practitioner

## 2021-09-13 DIAGNOSIS — C679 Malignant neoplasm of bladder, unspecified: Secondary | ICD-10-CM | POA: Diagnosis not present

## 2021-09-13 DIAGNOSIS — Z5111 Encounter for antineoplastic chemotherapy: Secondary | ICD-10-CM | POA: Diagnosis not present

## 2021-09-13 MED ORDER — MORPHINE SULFATE ER 15 MG PO TBCR: 15.0000 mg | EXTENDED_RELEASE_TABLET | Freq: Two times a day (BID) | ORAL | 0 refills | Status: DC

## 2021-09-13 MED ORDER — OXYCODONE-ACETAMINOPHEN 5-325 MG PO TABS: 1.0000 | ORAL_TABLET | ORAL | 0 refills | Status: DC | PRN

## 2021-09-13 NOTE — Progress Notes (Signed)
Patient had palliative care consult this morning. After appointment Palliative Care NP reached out in an attempt to avoid emergency room visit and possible admission as patient is very hesitant to go to the hospital.  ? ?It was suggested that patient increase to Oxy '5mg'$  - '10mg'$  q4 hours prn with an addition of long acting pain management.  ? ?Reviewed with Dr Marin Olp. He is in agreement with Palliative Care's pain management suggestions and plan for patient to stay home.  ? ?Called and spoke to patient and Cheryl Singh. We discussed what the NP had recommended. Cheryl Singh is very eager to try her plan at home and avoid hospitalization. After discussing all options and answering their questions, Cheryl Singh and the patient are comfortable starting the new pain management plan and staying at home. They both know that if at any time the pain become unbearable or any other urgent need, they need to seek care at the ED.  ? ?Notified Jobe Gibbon NP of discussions and she will reach out to confirm and send prescriptions.  ? ?Oncology Nurse Navigator Documentation ? ?Oncology Nurse Navigator Flowsheets 09/13/2021  ?Abnormal Finding Date -  ?Confirmed Diagnosis Date -  ?Diagnosis Status -  ?Planned Course of Treatment -  ?Phase of Treatment -  ?Chemotherapy Actual Start Date: -  ?Chemotherapy Expected End Date: -  ?Navigator Follow Up Date: 09/18/2021  ?Navigator Follow Up Reason: Follow-up Appointment;Chemotherapy  ?Navigator Location [facility]  ?Referral Date to RadOnc/MedOnc -  ?Navigator Encounter Type Telephone  ?Telephone Outgoing Call;Education  ?Treatment Initiated Date -  ?Patient Visit Type MedOnc  ?Treatment Phase Active Tx  ?Barriers/Navigation Needs Coordination of Care;Education  ?Education Pain/ Symptom Management;Other  ?Interventions Education;Psycho-Social Support  ?Acuity Level 2-Minimal Needs (1-2 Barriers Identified)  ?Referrals -  ?Coordination of Care -  ?Education Method Verbal  ?Support Groups/Services  Friends and Family  ?Time Spent with Patient 30  ?  ?

## 2021-09-13 NOTE — Progress Notes (Signed)
Palliative Medicine [facility]  [phone] [phone]   Name: Cheryl Singh Date: 09/13/2021 MRN: [id number]  DOB: Dec 03, 1950  Patient Care Team: Shon Hale, MD as PCP - General (Family Medicine) Myna Hidalgo Rose Phi, MD as Medical Oncologist (Oncology) Gwendel Hanson, RN as Oncology Nurse Navigator   I connected with Cheryl Singh on 09/13/21 at  9:00 AM EDT by phone and verified that I am speaking with the correct person using two identifiers.   I discussed the limitations, risks, security and privacy concerns of performing an evaluation and management service by telemedicine and the availability of in-person appointments. I also discussed with the patient that there may be a patient responsible charge related to this service. The patient expressed understanding and agreed to proceed.   Other persons participating in the visit and their role in the encounter: [name] Johnson (caregiver/HCPOA)  Patients location: Home Providers location: [facility]  Chief Complaint: Neoplasm related pain  REASON FOR CONSULTATION: Cheryl Singh is a 71 y.o. 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  Patient is actively followed by Dr. Myna Hidalgo with treatment plan in place (Gemzar/cisplatin/Zometa).  Palliative ask to see for symptom management.   SOCIAL HISTORY:     reports that she has been smoking cigarettes. She has a 20.00 pack-year smoking history. She has never used smokeless tobacco. She reports current alcohol use. She reports that she does not use drugs.  ADVANCE DIRECTIVES:  Patient reports she does have advanced directives in place.  Confirms that her caregiver Lance Bosch is her documented healthcare power of attorney.  Patient states she is open to heroic measures only under the circumstances of a meaningful outcome or some improvement.  If  this is not the case she would not want her life prolonged.  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) 08/16/2021   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 (HCC) 08/16/2021   Goals of care, counseling/discussion 08/16/2021   Hypertension     PAST SURGICAL HISTORY:  Past Surgical History:  Procedure Laterality Date   HEART CHAMBER REVISION     IR IMAGING GUIDED PORT INSERTION  08/20/2021   TRANSURETHRAL RESECTION OF BLADDER TUMOR Bilateral 07/17/2021   Procedure: CYSTOSCOPY BILATERAL RETROGRADES TRANSURETHRAL RESECTION OF BLADDER TUMOR (TURBT);  Surgeon: Belva Agee, MD;  Location: [facility];  Service: Urology;  Laterality: Bilateral;    HEMATOLOGY/ONCOLOGY HISTORY:  Oncology History  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)  08/16/2021 Initial Diagnosis   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)   08/16/2021 Cancer Staging   Staging form: Urinary Bladder, AJCC 8th Edition - Clinical stage from 08/16/2021: cT2, cN3, cM1 - Signed by Josph Macho, MD on 08/16/2021 WHO/ISUP grade (low/high): High Grade Histologic grading system: 2 grade system    08/28/2021 -  Chemotherapy   Patient is on Treatment Plan : BLADDER Cisplatin D1 + Gemcitabine D1,8 q21d x 6 Cycles     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  MEDICATIONS:  Current Outpatient Medications  Medication Sig Dispense Refill   morphine (MS CONTIN) 15 MG 12 hr tablet Take 1 tablet (15 mg total) by mouth every 12 (twelve) hours. 60 tablet 0   oxyCODONE-acetaminophen (PERCOCET/ROXICET) 5-325 MG tablet Take 1-2 tablets  by mouth every 4 (four) hours as needed for severe pain or moderate pain. 90 tablet 0   amLODipine (NORVASC) 5 MG tablet Take 5 mg by mouth daily.     atorvastatin (LIPITOR) 10 MG tablet Take 10 mg by mouth every evening.     celecoxib (CELEBREX) 200 MG capsule Take 1 capsule (200 mg total) by mouth 2 (two) times daily. 60 capsule 4   Cholecalciferol (VITAMIN D3) 50 MCG (2000 UT) capsule Take 2,000 Units by mouth daily.     dexamethasone (DECADRON) 4 MG tablet Take 2 tablets (8 mg total) by mouth daily. Take daily x 3 days starting the day after cisplatin chemotherapy. Take with food. 30 tablet 1   famotidine (PEPCID) 40 MG tablet Take 1 tablet (40 mg total) by mouth 2 (two) times daily. 60 tablet 4   FOLIC ACID PO Take 1 tablet by mouth daily. (Patient not taking: Reported on 08/16/2021)     Lactobacillus-Inulin (CULTURELLE DIGESTIVE DAILY PO) Take 1 Package by mouth daily.     lidocaine-prilocaine (EMLA) cream Apply to affected area once 30 g 3   losartan (COZAAR) 100 MG tablet Take 100 mg by mouth daily at 6 (six) AM.     Omega-3 Fatty Acids (FISH OIL) 1000 MG CAPS Take 1,000 mg by mouth daily.     ondansetron (ZOFRAN) 8 MG tablet Take 1 tablet (8 mg total) by mouth 2 (two) times daily as needed. Start on the third day after cisplatin chemotherapy. 30 tablet 1   polyethylene glycol (MIRALAX / GLYCOLAX) 17 g packet Take 17 g by mouth daily.     prochlorperazine (COMPAZINE) 10 MG tablet Take 1 tablet (10 mg total) by mouth every 6 (six) hours as needed (Nausea or vomiting). 30 tablet 1   No current facility-administered medications for this visit.    VITAL SIGNS: There were no vitals taken for this visit. There were no vitals filed for this visit.  Estimated body mass index is 28.22 kg/m as calculated from the following:   Height as of 08/28/21: 5' 4.5" (1.638 m).   Weight as of 08/28/21: 167 lb 0.1 oz (75.8 kg).  LABS: CBC:    Component Value Date/Time   WBC 6.0 09/04/2021 1102   WBC  6.9 07/13/2021 1000   HGB 10.0 (L) 09/04/2021 1102   HCT 29.1 (L) 09/04/2021 1102   PLT 191 09/04/2021 1102   MCV 94.2 09/04/2021 1102   NEUTROABS 4.8 09/04/2021 1102   LYMPHSABS 0.7 09/04/2021 1102   MONOABS 0.4 09/04/2021 1102   EOSABS 0.2 09/04/2021 1102   BASOSABS 0.0 09/04/2021 1102   Comprehensive Metabolic Panel:    Component Value Date/Time   NA 131 (L) 09/04/2021 1102   K 3.7 09/04/2021 1102   CL 93 (L) 09/04/2021 1102   CO2 28 09/04/2021 1102   BUN 14 09/04/2021 1102   CREATININE 0.61 09/04/2021 1102   GLUCOSE 114 (H) 09/04/2021 1102   CALCIUM 8.7 (L) 09/04/2021 1102   AST 15 09/04/2021 1102   ALT 24 09/04/2021 1102   ALKPHOS 233 (H) 09/04/2021 1102   BILITOT 0.6 09/04/2021 1102   PROT 6.1 (L) 09/04/2021 1102   ALBUMIN 3.3 (L) 09/04/2021 1102    RADIOGRAPHIC STUDIES:  NM PET Image Initial (PI) Skull Base To Thigh  Result Date: 08/31/2021 CLINICAL DATA:  Initial treatment strategy for bladder cancer. EXAM: NUCLEAR MEDICINE PET SKULL BASE TO THIGH TECHNIQUE: 8.2 mCi F-18 FDG was injected intravenously. Full-ring PET imaging was performed from the skull base to thigh after the radiotracer. CT data was obtained and used for attenuation correction and anatomic localization. Fasting blood glucose: 116 mg/dl COMPARISON:  Chest abdomen pelvis CT 08/02/2021 FINDINGS: Mediastinal blood pool activity: SUV max 2.7 Liver activity: SUV max NA NECK: No hypermetabolic lymph nodes in the neck. Incidental CT findings: none CHEST: 10 mm nodule between the thoracic spine and descending aorta on image 84/4 is markedly hypermetabolic with SUV max = 6.7. No hypermetabolic pulmonary nodule or mass. Incidental CT findings: Right Port-A-Cath tip is positioned in the upper right atrium. Coronary artery calcification is evident. Mild atherosclerotic calcification is noted in the wall of the thoracic aorta. Aberrant right subclavian artery evident. ABDOMEN/PELVIS: 2.1 cm lesion anterior right liver is  markedly hypermetabolic with SUV max = 14.7. 13 mm lesion in the lateral right liver is hypermetabolic with SUV max = 8.0 additional hypermetabolic liver metastases are seen in both hepatic lobes. No hypermetabolic retroperitoneal or pelvic sidewall lymphadenopathy although a 8 mm short axis left groin node on image 175/4 is markedly hypermetabolic with SUV max = 9.9. Primary bladder lesion not well evaluated due to excreted radiotracer in the bladder lumen although the large left-sided soft tissue bladder wall lesion evident on CT imaging. Incidental CT findings: There is mild atherosclerotic calcification of the abdominal aorta without aneurysm. Umbilical hernia contains only fat. Left colonic diverticulosis noted without diverticulitis. SKELETON: Numerous hypermetabolic bony metastases are identified in the cervical, thoracic, and lumbar spine. Metastatic involvement noted in the bony pelvis and bilateral ribs as well as the sternal, proximal right humerus, in both clavicles. Index 2.3 cm lesion right L5 vertebral body demonstrates SUV max = 13.4. 2.9 cm lesion posterior left iliac bone on 149/4 has SUV max = 12.3. Incidental CT findings: Degenerative changes noted in the right shoulder. IMPRESSION: 1. Large soft tissue mass involving the left bladder wall not well evaluated on PET imaging due to adjacent excreted radiotracer in the bladder lumen. 2. Hypermetabolic lymph node in the posterior mediastinum with another hypermetabolic lymph node identified in the left groin. 3. Hypermetabolic liver metastases involving both hepatic lobes. 4. Extensive hypermetabolic bony metastases involving the spine, sacrum, bony pelvis, both clavicles, bilateral ribs, and proximal right humerus. 5. Left colonic diverticulosis without diverticulitis. 6.  Aortic Atherosclerois (ICD10-170.0) Electronically Signed   By: Kennith Center M.D.   On: 08/31/2021 10:09   IR IMAGING GUIDED PORT INSERTION  Result Date:  08/20/2021 INDICATION: Chemotherapy EXAM: Ultrasound-guided puncture of the right internal jugular vein Placement of a right-sided chest port using fluoroscopic guidance MEDICATIONS: Per EMR ANESTHESIA/SEDATION: Moderate (conscious) sedation was employed during this procedure. A total of Versed 1 mg and Fentanyl 75 mcg was administered intravenously. Moderate Sedation Time: 32 minutes. The patient's level of consciousness and vital signs were monitored continuously by radiology nursing throughout the procedure under my direct supervision. FLUOROSCOPY TIME:  Fluoroscopy Time: 0.1 minutes (1 mGy) COMPLICATIONS: None immediate. PROCEDURE: Informed written consent was obtained from the patient after a thorough discussion of the procedural risks, benefits and alternatives. All questions were addressed. Maximal Sterile Barrier Technique was utilized including caps, mask, sterile gowns, sterile gloves, sterile drape, hand hygiene and skin antiseptic. A timeout was performed prior to the initiation of  the procedure. The patient was placed supine on the exam table. The right neck and chest was prepped and draped in the standard sterile fashion. A preliminary ultrasound of the right neck was performed and demonstrates a patent right internal jugular vein. A permanent ultrasound image was stored in the electronic medical record. The overlying skin was anesthetized with 1% Lidocaine. Using ultrasound guidance, access was obtained into the right internal jugular vein using a 21 gauge micropuncture set. A wire was advanced into the SVC, a short incision was made at the puncture site, and serial dilatation performed. Next, in an ipsilateral infraclavicular location, an incision was made at the site of the subcutaneous reservoir. Blunt dissection was used to open a pocket to contain the reservoir. A subcutaneous tunnel was then created from the port site to the puncture site. A(n) 8 Fr single lumen catheter was advanced through the  tunnel. The catheter was attached to the port and this was placed in the subcutaneous pocket. Under fluoroscopic guidance, a peel away sheath was placed, and the catheter was trimmed to the appropriate length and was advanced into the central veins. The catheter length is 24 cm. The tip of the catheter lies near the superior cavoatrial junction. The port flushes and aspirates appropriately. The port was flushed and locked with heparinized saline. The port pocket was closed in 2 layers using 3-0 and 4-0 Vicryl/absorbable suture. Dermabond was also applied to both incisions. The patient tolerated the procedure well and was transferred to recovery in stable condition. IMPRESSION: Successful placement of a right-sided chest port via the right internal jugular vein. The port is ready for immediate use. Electronically Signed   By: Olive Bass M.D.   On: 08/20/2021 15:06    PERFORMANCE STATUS (ECOG) : 1 - Symptomatic but completely ambulatory  Review of Systems  Constitutional:  Positive for fatigue.  Musculoskeletal:  Positive for arthralgias and back pain.  Unless otherwise noted, a complete review of systems is negative.   IMPRESSION:  This is my initial visit with Ms. Defrain.  Her friend/caregiver [name] is also on the call and present in the home with patient.  I introduced myself, Research officer, trade union, and Palliative's role in collaboration with the oncology team. Concept of Palliative Care was introduced as specialized medical care for people and their families living with serious illness.  It focuses on providing relief from the symptoms and stress of a serious illness.  The goal is to improve quality of life for both the patient and the family. Values and goals of care important to patient and family were attempted to be elicited.  Patient and caregiver verbalized understanding and appreciation.  Colby is originally from [place].  She is never married and does not have any children.  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  Per patient and caregiver she is ambulatory with some assistance.  Able to perform all ADLs independently.  Does require frequent rest breaks due to fatigue and pain.  Patient recently had a new shower installed to include grab bars for her safety.  Denies nausea, vomiting, diarrhea, constipation, and any recent falls.  Denies any other reported injuries.  We discussed Her current illness and what it means in the larger context of Her on-going co-morbidities. Natural disease trajectory and expectations were discussed.  Neoplasm related pain Ms. Tutor reports ongoing left side, sternal, back pain.  Reports pain is more severe when she is up moving around.  It  is hard for her to sit up and she is no longer able to tolerate being in her recliner which leads her to spend most of her time in bed.  Feels some comfort in a lying position.  Pain continues to be uncontrolled despite current regimen of taking Percocet 7.5/325 every 4 days 5 hours.  She does endorse some relief for about an hour again only if she remains in a lying position.  She is scheduled to have a consultation on 3/29 with radiation oncology.  We discussed the importance of this appointment as this will help with managing her pain and with her disease process.  Patient was originally planning on going to the emergency room for further evaluation due to significant uncontrolled pain.  I spoke at length about attempts to try to control pain outside of the hospital and if not successful consideration for hospital admission.  Patient verbalized understanding expressing her agreement and she does not wish to be in the hospital.  Acknowledges concerns about having to go to rehab if hospitalized her wishes are to be at home if at all possible.  I have spoken with Asher Muir, RN (Navigator) who also confirmed with Dr. Myna Hidalgo about potential plans and trial of managing pain at  home.  Education provided on starting new pain regimen, expectations, and potential side effects.  Patient and caregiver verbalized understanding confirming wishes to proceed with new regimen.  We will begin Percocet 5/325 every 4-6 hours as needed for breakthrough pain.  Patient is aware she can take 1-2 tablets pending moderate to severe pain.    MS Contin 15 mg every 12 hours.  Patient advised to take first dose tonight at bedtime.  We will plan to closely monitor and have follow-up phone call for further evaluation on tomorrow at 11 AM.  Ms. Brownback verbalized understanding and appreciation of her medical team following together to hopefully gain her better pain control, increase in quality of life, and prevent hospitalizations.  Constipation Patient advised to take MiraLAX daily to prevent constipation in the setting of opioid use.  Reports she has irregularities in her bowel movement schedules.  Advised if no bowel movement within 24 to 48 hours to increase MiraLAX to twice daily.  We will continue to closely monitor.  I discussed the importance of continued conversation with family and their medical providers regarding overall plan of care and treatment options, ensuring decisions are within the context of the patients values and GOCs.  PLAN: MS Contin 15 mg every 12 hours Percocet 5/325 mg every 4-6 hours as needed for breakthrough pain.  Patient may take 1-2 tablets for severe pain.  Education provided to discontinue use of 7.5/325 tablets.  Increase frequency and dosage with consideration of dose reduction cross tolerance in the setting of the start of MS Contin. MiraLAX daily for bowel regimen Treatment plan discussed with Asher Muir, RN (Navigator) who also conferred with Dr. Myna Hidalgo. We will plan to follow-up tomorrow via phone to evaluate pain and tolerance to medications.  Patient aware we will call around 11 AM.   I will plan to see patient in the office on 3/29 when she comes in  for her radiation consult.   Patient and [name] (caregiver) expressed understanding and was in agreement with this plan. She also understands that She can call the clinic at any time with any questions, concerns, or complaints.   Time Total: 55 min  Visit consisted of counseling and education dealing with the complex and emotionally intense issues  of symptom management and palliative care in the setting of serious and potentially life-threatening illness.Greater than 50%  of this time was spent counseling and coordinating care related to the above assessment and plan.  Signed by: Willette Alma, AGPCNP-BC Palliative Medicine Team/[facility]

## 2021-09-14 ENCOUNTER — Encounter (HOSPITAL_COMMUNITY): Payer: Self-pay | Admitting: Emergency Medicine

## 2021-09-14 ENCOUNTER — Encounter: Payer: Self-pay | Admitting: Nurse Practitioner

## 2021-09-14 ENCOUNTER — Other Ambulatory Visit: Payer: Self-pay

## 2021-09-14 ENCOUNTER — Inpatient Hospital Stay (HOSPITAL_COMMUNITY)
Admission: EM | Admit: 2021-09-14 | Discharge: 2021-09-22 | DRG: 811 | Disposition: A | Discharge status: Home / Self Care | Payer: Medicare PPO | Attending: Internal Medicine | Admitting: Internal Medicine

## 2021-09-14 DIAGNOSIS — R55 Syncope and collapse: Principal | ICD-10-CM

## 2021-09-14 DIAGNOSIS — C679 Malignant neoplasm of bladder, unspecified: Secondary | ICD-10-CM | POA: Diagnosis not present

## 2021-09-14 DIAGNOSIS — R531 Weakness: Secondary | ICD-10-CM | POA: Diagnosis present

## 2021-09-14 DIAGNOSIS — R0602 Shortness of breath: Secondary | ICD-10-CM | POA: Diagnosis not present

## 2021-09-14 DIAGNOSIS — Z95828 Presence of other vascular implants and grafts: Secondary | ICD-10-CM

## 2021-09-14 DIAGNOSIS — E611 Iron deficiency: Secondary | ICD-10-CM | POA: Diagnosis present

## 2021-09-14 DIAGNOSIS — S2232XA Fracture of one rib, left side, initial encounter for closed fracture: Secondary | ICD-10-CM

## 2021-09-14 DIAGNOSIS — D61818 Other pancytopenia: Secondary | ICD-10-CM | POA: Insufficient documentation

## 2021-09-14 DIAGNOSIS — C7951 Secondary malignant neoplasm of bone: Secondary | ICD-10-CM | POA: Diagnosis not present

## 2021-09-14 DIAGNOSIS — A419 Sepsis, unspecified organism: Secondary | ICD-10-CM | POA: Diagnosis not present

## 2021-09-14 DIAGNOSIS — Z791 Long term (current) use of non-steroidal anti-inflammatories (NSAID): Secondary | ICD-10-CM

## 2021-09-14 DIAGNOSIS — E860 Dehydration: Secondary | ICD-10-CM | POA: Diagnosis present

## 2021-09-14 DIAGNOSIS — R319 Hematuria, unspecified: Secondary | ICD-10-CM | POA: Diagnosis not present

## 2021-09-14 DIAGNOSIS — E785 Hyperlipidemia, unspecified: Secondary | ICD-10-CM | POA: Diagnosis present

## 2021-09-14 DIAGNOSIS — K219 Gastro-esophageal reflux disease without esophagitis: Secondary | ICD-10-CM | POA: Diagnosis present

## 2021-09-14 DIAGNOSIS — M1990 Unspecified osteoarthritis, unspecified site: Secondary | ICD-10-CM | POA: Diagnosis present

## 2021-09-14 DIAGNOSIS — Z8551 Personal history of malignant neoplasm of bladder: Secondary | ICD-10-CM

## 2021-09-14 DIAGNOSIS — Z20822 Contact with and (suspected) exposure to covid-19: Secondary | ICD-10-CM | POA: Diagnosis present

## 2021-09-14 DIAGNOSIS — J9601 Acute respiratory failure with hypoxia: Secondary | ICD-10-CM | POA: Diagnosis present

## 2021-09-14 DIAGNOSIS — E876 Hypokalemia: Secondary | ICD-10-CM | POA: Diagnosis not present

## 2021-09-14 DIAGNOSIS — C787 Secondary malignant neoplasm of liver and intrahepatic bile duct: Secondary | ICD-10-CM | POA: Diagnosis present

## 2021-09-14 DIAGNOSIS — N3001 Acute cystitis with hematuria: Secondary | ICD-10-CM | POA: Diagnosis not present

## 2021-09-14 DIAGNOSIS — Z79899 Other long term (current) drug therapy: Secondary | ICD-10-CM

## 2021-09-14 DIAGNOSIS — M8458XA Pathological fracture in neoplastic disease, other specified site, initial encounter for fracture: Secondary | ICD-10-CM | POA: Diagnosis present

## 2021-09-14 DIAGNOSIS — N390 Urinary tract infection, site not specified: Secondary | ICD-10-CM | POA: Diagnosis present

## 2021-09-14 DIAGNOSIS — I10 Essential (primary) hypertension: Secondary | ICD-10-CM | POA: Diagnosis present

## 2021-09-14 DIAGNOSIS — C775 Secondary and unspecified malignant neoplasm of intrapelvic lymph nodes: Secondary | ICD-10-CM | POA: Diagnosis present

## 2021-09-14 DIAGNOSIS — D649 Anemia, unspecified: Secondary | ICD-10-CM | POA: Diagnosis not present

## 2021-09-14 DIAGNOSIS — G893 Neoplasm related pain (acute) (chronic): Secondary | ICD-10-CM | POA: Diagnosis not present

## 2021-09-14 DIAGNOSIS — F17210 Nicotine dependence, cigarettes, uncomplicated: Secondary | ICD-10-CM | POA: Diagnosis present

## 2021-09-14 DIAGNOSIS — Z510 Encounter for antineoplastic radiation therapy: Secondary | ICD-10-CM | POA: Diagnosis not present

## 2021-09-14 DIAGNOSIS — I491 Atrial premature depolarization: Secondary | ICD-10-CM | POA: Diagnosis present

## 2021-09-14 DIAGNOSIS — I951 Orthostatic hypotension: Secondary | ICD-10-CM | POA: Diagnosis present

## 2021-09-14 DIAGNOSIS — D539 Nutritional anemia, unspecified: Secondary | ICD-10-CM

## 2021-09-14 DIAGNOSIS — R3129 Other microscopic hematuria: Secondary | ICD-10-CM | POA: Diagnosis present

## 2021-09-14 DIAGNOSIS — E878 Other disorders of electrolyte and fluid balance, not elsewhere classified: Secondary | ICD-10-CM | POA: Diagnosis not present

## 2021-09-14 DIAGNOSIS — R000 Tachycardia, unspecified: Secondary | ICD-10-CM | POA: Diagnosis not present

## 2021-09-14 DIAGNOSIS — Z5111 Encounter for antineoplastic chemotherapy: Secondary | ICD-10-CM | POA: Diagnosis not present

## 2021-09-14 NOTE — Progress Notes (Signed)
I do not understand what the issue is.  I am praying about this.  Pete ?

## 2021-09-14 NOTE — Progress Notes (Signed)
Histology and Location of Primary Cancer:  ?08/16/2021 Initial Diagnosis  ?  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 [facility]) ?   ?08/16/2021 Cancer Staging  ?  Staging form: Urinary Bladder, AJCC 8th Edition ?- Clinical stage from 08/16/2021: cT2, cN3, cM1 - Signed by Volanda Napoleon, MD on 08/16/2021 ?WHO/ISUP grade (low/high): High Grade ?Histologic grading system: 2 grade system  ? ?08/16/2021 Initial Diagnosis  ?  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 [facility]) ?   ? ? ?Sites of Visceral and Bony Metastatic Disease:  ?1. Large soft tissue mass involving the left bladder wall not well ?evaluated on PET imaging due to adjacent excreted radiotracer in the ?bladder lumen. ?2. Hypermetabolic lymph node in the posterior mediastinum with ?another hypermetabolic lymph node identified in the left groin. ?3. Hypermetabolic liver metastases involving both hepatic lobes. ?4. Extensive hypermetabolic bony metastases involving the spine, ?sacrum, bony pelvis, both clavicles, bilateral ribs, and proximal ?right humerus. ? ?Location(s) of Symptomatic Metastases: Cheryl Singh reports ongoing left side, sternal, back pain. ? ?Past/Anticipated chemotherapy by medical oncology, if any: Dr Marin Olp ?08/28/2021 -  Chemotherapy  ?  Patient is on Treatment Plan : BLADDER Cisplatin D1 + Gemcitabine D1,8 q21d x 6 Cycles   ? ? ?Ambulatory status? Walker? Wheelchair?: Ambulatory with Assistance ? ?

## 2021-09-14 NOTE — Progress Notes (Signed)
I connected with Cheryl Singh and [name] via phone for follow-up on pain control. She was able to pick up her MS Contin and Percocet on yesterday. Has taken as prescribed. Reports much better nights sleep and pain is much improved which she is appreciative of. Reports she is able to sit up and do a little more things around the house with less discomfort. Advised to take things slowly and listen to her body. Although feeling better do not want to overdue things and escalate pain again. She verbalized understanding.  ? ?Does reports some drowsiness this morning after taking morning dose. We discussed regimen and given improvement and to decrease daytime drowsiness advised to discontinue morning dose and only take at night. We will plan to closely monitor with hopes pain will remain managed. If not she is ok with going back to twice a day. Felt good when she woke up this morning.  ? ?Reminder to take Miralax daily to prevent constipation.  ? ?Ms. Izzo and [name] have our office number and knows to call with any changes. I will plan to have Jarrett Soho, RN call and follow-up with her on Monday to see how her weekend went.  ? ? ?Visit consisted of counseling and education dealing with the complex and emotionally intense issues of symptom management and palliative care in the setting of serious and potentially life-threatening illness.Greater than 50%  of this time was spent counseling and coordinating care related to the above assessment and plan. ? ?Alda Lea, AGPCNP-BC  ?[place] ? ? ?

## 2021-09-14 NOTE — ED Triage Notes (Addendum)
Pt BIB [facility] from home with SOB and tremors. Hx HTN and metatstatic bladder cancer. Initially pale, hypotensive (80/60) and tachycardic (150) on EMS arrival.  ? ?1L given en route and normotensive on arrival.  ?

## 2021-09-15 ENCOUNTER — Emergency Department (HOSPITAL_COMMUNITY): Payer: Medicare PPO

## 2021-09-15 ENCOUNTER — Encounter (HOSPITAL_COMMUNITY): Payer: Self-pay

## 2021-09-15 DIAGNOSIS — R55 Syncope and collapse: Secondary | ICD-10-CM | POA: Diagnosis present

## 2021-09-15 DIAGNOSIS — I951 Orthostatic hypotension: Secondary | ICD-10-CM | POA: Diagnosis present

## 2021-09-15 DIAGNOSIS — R319 Hematuria, unspecified: Secondary | ICD-10-CM | POA: Diagnosis not present

## 2021-09-15 DIAGNOSIS — K219 Gastro-esophageal reflux disease without esophagitis: Secondary | ICD-10-CM

## 2021-09-15 DIAGNOSIS — A419 Sepsis, unspecified organism: Secondary | ICD-10-CM | POA: Diagnosis not present

## 2021-09-15 DIAGNOSIS — N390 Urinary tract infection, site not specified: Secondary | ICD-10-CM

## 2021-09-15 DIAGNOSIS — I10 Essential (primary) hypertension: Secondary | ICD-10-CM

## 2021-09-15 DIAGNOSIS — E785 Hyperlipidemia, unspecified: Secondary | ICD-10-CM

## 2021-09-15 DIAGNOSIS — D649 Anemia, unspecified: Secondary | ICD-10-CM | POA: Insufficient documentation

## 2021-09-15 DIAGNOSIS — D539 Nutritional anemia, unspecified: Secondary | ICD-10-CM

## 2021-09-15 DIAGNOSIS — D61818 Other pancytopenia: Secondary | ICD-10-CM | POA: Insufficient documentation

## 2021-09-15 DIAGNOSIS — C679 Malignant neoplasm of bladder, unspecified: Secondary | ICD-10-CM | POA: Diagnosis not present

## 2021-09-15 LAB — CBC WITH DIFFERENTIAL/PLATELET
Abs Immature Granulocytes: 0.7 10*3/uL — ABNORMAL HIGH (ref 0.00–0.07)
Band Neutrophils: 11 %
Basophils Absolute: 0 10*3/uL (ref 0.0–0.1)
Basophils Relative: 0 %
Blasts: 3 %
Eosinophils Absolute: 0 10*3/uL (ref 0.0–0.5)
Eosinophils Relative: 0 %
HCT: 27.2 % — ABNORMAL LOW (ref 36.0–46.0)
Hemoglobin: 8.5 g/dL — ABNORMAL LOW (ref 12.0–15.0)
Lymphocytes Relative: 12 %
Lymphs Abs: 0.8 10*3/uL (ref 0.7–4.0)
MCH: 31.7 pg (ref 26.0–34.0)
MCHC: 31.3 g/dL (ref 30.0–36.0)
MCV: 101.5 fL — ABNORMAL HIGH (ref 80.0–100.0)
Metamyelocytes Relative: 3 %
Monocytes Absolute: 2 10*3/uL — ABNORMAL HIGH (ref 0.1–1.0)
Monocytes Relative: 29 %
Myelocytes: 8 %
Neutro Abs: 3.1 10*3/uL (ref 1.7–7.7)
Neutrophils Relative %: 34 %
Platelets: 528 10*3/uL — ABNORMAL HIGH (ref 150–400)
RBC: 2.68 MIL/uL — ABNORMAL LOW (ref 3.87–5.11)
RDW: 13.1 % (ref 11.5–15.5)
Smear Review: INCREASED
WBC: 6.8 10*3/uL (ref 4.0–10.5)
nRBC: 0.9 % — ABNORMAL HIGH (ref 0.0–0.2)
nRBC: 1 /100 WBC — ABNORMAL HIGH

## 2021-09-15 LAB — COMPREHENSIVE METABOLIC PANEL
ALT: 23 U/L (ref 0–44)
AST: 23 U/L (ref 15–41)
Albumin: 2.3 g/dL — ABNORMAL LOW (ref 3.5–5.0)
Alkaline Phosphatase: 174 U/L — ABNORMAL HIGH (ref 38–126)
Anion gap: 13 (ref 5–15)
BUN: 17 mg/dL (ref 8–23)
CO2: 20 mmol/L — ABNORMAL LOW (ref 22–32)
Calcium: 7.2 mg/dL — ABNORMAL LOW (ref 8.9–10.3)
Chloride: 101 mmol/L (ref 98–111)
Creatinine, Ser: 0.85 mg/dL (ref 0.44–1.00)
GFR, Estimated: >60 mL/min (ref >60)
Glucose, Bld: 123 mg/dL — ABNORMAL HIGH (ref 70–99)
Potassium: 3.9 mmol/L (ref 3.5–5.1)
Sodium: 134 mmol/L — ABNORMAL LOW (ref 135–145)
Total Bilirubin: 0.3 mg/dL (ref 0.3–1.2)
Total Protein: 5.6 g/dL — ABNORMAL LOW (ref 6.5–8.1)

## 2021-09-15 LAB — I-STAT CHEM 8, ED
BUN: 15 mg/dL (ref 8–23)
Calcium, Ion: 1.08 mmol/L — ABNORMAL LOW (ref 1.15–1.40)
Chloride: 100 mmol/L (ref 98–111)
Creatinine, Ser: 0.9 mg/dL (ref 0.44–1.00)
Glucose, Bld: 116 mg/dL — ABNORMAL HIGH (ref 70–99)
HCT: 23 % — ABNORMAL LOW (ref 36.0–46.0)
Hemoglobin: 7.8 g/dL — ABNORMAL LOW (ref 12.0–15.0)
Potassium: 4.1 mmol/L (ref 3.5–5.1)
Sodium: 134 mmol/L — ABNORMAL LOW (ref 135–145)
TCO2: 26 mmol/L (ref 22–32)

## 2021-09-15 LAB — IRON AND TIBC
Iron: 14 ug/dL — ABNORMAL LOW (ref 28–170)
Saturation Ratios: 10 % — ABNORMAL LOW (ref 10.4–31.8)
TIBC: 145 ug/dL — ABNORMAL LOW (ref 250–450)
UIBC: 131 ug/dL

## 2021-09-15 LAB — RESP PANEL BY RT-PCR (FLU A&B, COVID) ARPGX2
Influenza A by PCR: NEGATIVE
Influenza B by PCR: NEGATIVE
SARS Coronavirus 2 by RT PCR: NEGATIVE

## 2021-09-15 LAB — HEMOGLOBIN AND HEMATOCRIT, BLOOD
HCT: 25.3 % — ABNORMAL LOW (ref 36.0–46.0)
HCT: 25.5 % — ABNORMAL LOW (ref 36.0–46.0)
Hemoglobin: 7.9 g/dL — ABNORMAL LOW (ref 12.0–15.0)
Hemoglobin: 7.9 g/dL — ABNORMAL LOW (ref 12.0–15.0)

## 2021-09-15 LAB — URINALYSIS, ROUTINE W REFLEX MICROSCOPIC
Bilirubin Urine: NEGATIVE
Glucose, UA: NEGATIVE mg/dL
Ketones, ur: NEGATIVE mg/dL
Nitrite: NEGATIVE
Protein, ur: 100 mg/dL — AB
Specific Gravity, Urine: 1.044 — ABNORMAL HIGH (ref 1.005–1.030)
WBC, UA: >50 WBC/hpf — ABNORMAL HIGH (ref 0–5)
pH: 5 (ref 5.0–8.0)

## 2021-09-15 LAB — LACTIC ACID, PLASMA: Lactic Acid, Venous: 1.7 mmol/L (ref 0.5–1.9)

## 2021-09-15 LAB — HIV ANTIBODY (ROUTINE TESTING W REFLEX): HIV Screen 4th Generation wRfx: NONREACTIVE

## 2021-09-15 MED ORDER — SODIUM CHLORIDE 0.9 % IV SOLN
1000.0000 mL | INTRAVENOUS | Status: AC
Start: 2021-09-15 — End: 2021-09-16
  Administered 2021-09-15 (×2): 1000 mL via INTRAVENOUS

## 2021-09-15 MED ORDER — POLYETHYLENE GLYCOL 3350 17 G PO PACK: 17.0000 g | PACK | Freq: Every day | ORAL | Status: DC | PRN

## 2021-09-15 MED ORDER — PROCHLORPERAZINE MALEATE 10 MG PO TABS: 10.0000 mg | ORAL_TABLET | Freq: Four times a day (QID) | ORAL | Status: DC | PRN

## 2021-09-15 MED ORDER — VITAMIN D3 25 MCG (1000 UNIT) PO TABS
2000.0000 [IU] | ORAL_TABLET | Freq: Every day | ORAL | Status: DC
  Administered 2021-09-15 – 2021-09-22 (×8): 2000 [IU] via ORAL
  Filled 2021-09-15 (×8): qty 2

## 2021-09-15 MED ORDER — SODIUM CHLORIDE 0.9 % IV BOLUS (SEPSIS)
1000.0000 mL | Freq: Once | INTRAVENOUS | Status: AC
Start: 2021-09-15 — End: 2021-09-15
  Administered 2021-09-15: 1000 mL via INTRAVENOUS

## 2021-09-15 MED ORDER — FAMOTIDINE 20 MG PO TABS
40.0000 mg | ORAL_TABLET | Freq: Two times a day (BID) | ORAL | Status: DC
  Administered 2021-09-15 – 2021-09-22 (×13): 40 mg via ORAL
  Filled 2021-09-15 (×14): qty 2

## 2021-09-15 MED ORDER — CALCIUM CARBONATE ANTACID 500 MG PO CHEW
1.0000 | CHEWABLE_TABLET | Freq: Two times a day (BID) | ORAL | Status: DC
  Administered 2021-09-15 – 2021-09-22 (×15): 200 mg via ORAL
  Filled 2021-09-15 (×15): qty 1

## 2021-09-15 MED ORDER — OMEGA-3-ACID ETHYL ESTERS 1 G PO CAPS
1.0000 g | ORAL_CAPSULE | Freq: Every day | ORAL | Status: DC
  Administered 2021-09-15 – 2021-09-22 (×8): 1 g via ORAL
  Filled 2021-09-15 (×6): qty 1

## 2021-09-15 MED ORDER — SODIUM CHLORIDE 0.9 % IV SOLN
1000.0000 mL | INTRAVENOUS | Status: DC
  Administered 2021-09-15: 1000 mL via INTRAVENOUS

## 2021-09-15 MED ORDER — SODIUM CHLORIDE 0.9 % IV BOLUS
1000.0000 mL | Freq: Once | INTRAVENOUS | Status: AC
  Administered 2021-09-15: 1000 mL via INTRAVENOUS

## 2021-09-15 MED ORDER — SODIUM CHLORIDE 0.9 % IV SOLN
1.0000 g | INTRAVENOUS | Status: DC
  Administered 2021-09-15 – 2021-09-18 (×4): 1 g via INTRAVENOUS
  Filled 2021-09-15 (×4): qty 10

## 2021-09-15 MED ORDER — IOHEXOL 350 MG/ML SOLN
75.0000 mL | Freq: Once | INTRAVENOUS | Status: AC | PRN
  Administered 2021-09-15: 75 mL via INTRAVENOUS

## 2021-09-15 MED ORDER — OXYCODONE-ACETAMINOPHEN 5-325 MG PO TABS
1.0000 | ORAL_TABLET | ORAL | Status: DC | PRN
  Administered 2021-09-15 – 2021-09-17 (×11): 1 via ORAL
  Administered 2021-09-17 – 2021-09-18 (×2): 2 via ORAL
  Administered 2021-09-18 – 2021-09-19 (×3): 1 via ORAL
  Administered 2021-09-19 – 2021-09-20 (×2): 2 via ORAL
  Administered 2021-09-20 – 2021-09-21 (×4): 1 via ORAL
  Administered 2021-09-21: 2 via ORAL
  Administered 2021-09-22 (×3): 1 via ORAL
  Filled 2021-09-15 (×3): qty 1
  Filled 2021-09-15: qty 2
  Filled 2021-09-15: qty 1
  Filled 2021-09-15 (×3): qty 2
  Filled 2021-09-15 (×2): qty 1
  Filled 2021-09-15: qty 2
  Filled 2021-09-15: qty 1
  Filled 2021-09-15: qty 2
  Filled 2021-09-15 (×3): qty 1
  Filled 2021-09-15: qty 2
  Filled 2021-09-15 (×2): qty 1
  Filled 2021-09-15: qty 2
  Filled 2021-09-15 (×2): qty 1
  Filled 2021-09-15: qty 2
  Filled 2021-09-15 (×4): qty 1
  Filled 2021-09-15: qty 2

## 2021-09-15 MED ORDER — PNEUMOCOCCAL 20-VAL CONJ VACC 0.5 ML IM SUSY
0.5000 mL | PREFILLED_SYRINGE | INTRAMUSCULAR | Status: DC
  Filled 2021-09-15: qty 0.5

## 2021-09-15 MED ORDER — ATORVASTATIN CALCIUM 10 MG PO TABS
10.0000 mg | ORAL_TABLET | Freq: Every evening | ORAL | Status: DC
  Administered 2021-09-15 – 2021-09-21 (×7): 10 mg via ORAL
  Filled 2021-09-15 (×7): qty 1

## 2021-09-15 NOTE — ED Notes (Addendum)
Pt. Ambulated in the room on 76% room air, heart rate 128 with walker and 2 assist. Pt. Redirected back to bed 80% room air heart rate 130. Pt. Placed on 4 liters oxygen by RN. MD. Made aware. ?

## 2021-09-15 NOTE — Final Consult Note (Signed)
?Subjective: ?  ? ?Consult requested by Dr. Cherylann Ratel. ? ? ?Cheryl Singh is a 70 yo female with a history of metastatic muscle invasive urothelial CA that was diagnosed with a TURBT on 07/17/21 by Dr. Milford Cage.  She has bone and visceral mets and has pain from a left 7th rib fracture.   She had the onset of chills and near syncope yesterday and was seen in the ER and is admitted for possible sepsis.  She was found to be anemic with a Hgb of 7.8 which is down from 10 on 09/04/21 and 11.4 on 08/16/21.  She is currently on Cisplatin and steroids with her last infusion on 09/11/21.   She has also been placed on MS contin and Celebrex for the pain.  She reports some epigastric discomfort that she attributes to the meds.   I was asked to see her because of the anemia and presence of blood in the urine, but on questioning she has only had infrequent small amounts of hematuria and none in a few days.  She is otherwise voiding ok.  UA has >50 WBC and mod bacteria with 11-20 RBC's.  It is nit-.  CT angio Chest today shows bone mets with a new 7th rib fx and hepatic mets.  PET scan on 08/31/21 showed no significant bladder mass or ureteral obstruction.   ? ?ROS: ? ?Review of Systems  ?Constitutional:  Positive for chills.  ?Respiratory:  Positive for shortness of breath.   ?Cardiovascular:  Positive for chest pain (left lower ribs).  ?Gastrointestinal:  Negative for blood in stool and melena.  ?Neurological:  Positive for weakness.  ? ?No Known Allergies ? ?Past Medical History:  ?Diagnosis Date  ? Arthritis   ? 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 ([place]) 08/16/2021  ? 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 ([place]) 08/16/2021  ? Goals of care, counseling/discussion 08/16/2021  ? Hypertension   ? ? ?Past Surgical History:  ?Procedure Laterality Date  ? HEART CHAMBER REVISION    ? IR IMAGING GUIDED PORT INSERTION  08/20/2021  ? TRANSURETHRAL RESECTION OF BLADDER TUMOR Bilateral  07/17/2021  ? Procedure: CYSTOSCOPY BILATERAL RETROGRADES TRANSURETHRAL RESECTION OF BLADDER TUMOR (TURBT);  Surgeon: Remi Haggard, MD;  Location: [facility];  Service: Urology;  Laterality: Bilateral;  ? ? ?Social History  ? ?Socioeconomic History  ? Marital status: Single  ?  Spouse name: Not on file  ? Number of children: Not on file  ? Years of education: Not on file  ? Highest education level: Not on file  ?Occupational History  ? Not on file  ?Tobacco Use  ? Smoking status: Every Day  ?  Packs/day: 0.50  ?  Years: 40.00  ?  Pack years: 20.00  ?  Types: Cigarettes  ? Smokeless tobacco: Never  ?Vaping Use  ? Vaping Use: Never used  ?Substance and Sexual Activity  ? Alcohol use: Yes  ?  Comment: occ  ? Drug use: No  ? Sexual activity: Not on file  ?Other Topics Concern  ? Not on file  ?Social History Narrative  ? Not on file  ? ?Social Determinants of Health  ? ?Financial Resource Strain: Not on file  ?Food Insecurity: Not on file  ?Transportation Needs: Not on file  ?Physical Activity: Not on file  ?Stress: Not on file  ?Social Connections: Not on file  ?Intimate Partner Violence: Not on file  ? ? ?History reviewed. No pertinent family  history. ? ?Anti-infectives: ?Anti-infectives (From admission, onward)  ? ? Start     Dose/Rate Route Frequency Ordered Stop  ? 09/15/21 0630  cefTRIAXone (ROCEPHIN) 1 g in sodium chloride 0.9 % 100 mL IVPB       ? 1 g ?200 mL/hr over 30 Minutes Intravenous Every 24 hours 09/15/21 0609    ? ?  ? ? ?Current Facility-Administered Medications  ?Medication Dose Route Frequency Provider Last Rate Last Admin  ? 0.9 %  sodium chloride infusion  1,000 mL Intravenous Continuous Cherylann Ratel A, DO 125 mL/hr at 09/15/21 0847 Restarted at 09/15/21 0847  ? atorvastatin (LIPITOR) tablet 10 mg  10 mg Oral QPM Kyle, Tyrone A, DO      ? calcium carbonate (TUMS - dosed in mg elemental calcium) chewable tablet 200 mg of elemental calcium  1 tablet Oral BID Marylyn Ishihara, Tyrone A, DO   200 mg of elemental  calcium at 09/15/21 0953  ? cefTRIAXone (ROCEPHIN) 1 g in sodium chloride 0.9 % 100 mL IVPB  1 g Intravenous Q24H Kyle, Tyrone A, DO 200 mL/hr at 09/15/21 0958 1 g at 09/15/21 0958  ? cholecalciferol (VITAMIN D) tablet 2,000 Units  2,000 Units Oral Daily Cherylann Ratel A, DO   2,000 Units at 09/15/21 0954  ? famotidine (PEPCID) tablet 40 mg  40 mg Oral BID Marylyn Ishihara, Tyrone A, DO   40 mg at 09/15/21 0954  ? omega-3 acid ethyl esters (LOVAZA) capsule 1 g  1 g Oral Daily Kyle, Tyrone A, DO   1 g at 09/15/21 0954  ? oxyCODONE-acetaminophen (PERCOCET/ROXICET) 5-325 MG per tablet 1-2 tablet  1-2 tablet Oral Q4H PRN Marylyn Ishihara, Tyrone A, DO      ? polyethylene glycol (MIRALAX / GLYCOLAX) packet 17 g  17 g Oral Daily PRN Marylyn Ishihara, Tyrone A, DO      ? prochlorperazine (COMPAZINE) tablet 10 mg  10 mg Oral Q6H PRN Marylyn Ishihara, Tyrone A, DO      ? ? ? ?Objective: ?Vital signs in last 24 hours: ?BP 127/72 (BP Location: Right Arm)   Pulse 94   Temp 98.6 ?F (37 ?C) (Oral)   Resp 18   SpO2 95%  ? ?Intake/Output from previous day: ?No intake/output data recorded. ?Intake/Output this shift: ?No intake/output data recorded. ? ? ?Physical Exam ?Vitals reviewed.  ?Constitutional:   ?   Appearance: Normal appearance.  ?Abdominal:  ?   Palpations: Abdomen is soft. There is no mass.  ?   Tenderness: There is no abdominal tenderness.  ?Neurological:  ?   Mental Status: She is alert.  ? ? ?Lab Results:  ?Results for orders placed or performed during the hospital encounter of 09/14/21 (from the past 24 hour(s))  ?Resp Panel by RT-PCR (Flu A&B, Covid) Nasopharyngeal Swab     Status: None  ? Collection Time: 09/15/21 12:11 AM  ? Specimen: Nasopharyngeal Swab; Nasopharyngeal(NP) swabs in vial transport medium  ?Result Value Ref Range  ? SARS Coronavirus 2 by RT PCR NEGATIVE NEGATIVE  ? Influenza A by PCR NEGATIVE NEGATIVE  ? Influenza B by PCR NEGATIVE NEGATIVE  ?CBC with Differential/Platelet     Status: Abnormal  ? Collection Time: 09/15/21 12:20 AM  ?Result  Value Ref Range  ? WBC 6.8 4.0 - 10.5 K/uL  ? RBC 2.68 (L) 3.87 - 5.11 MIL/uL  ? Hemoglobin 8.5 (L) 12.0 - 15.0 g/dL  ? HCT 27.2 (L) 36.0 - 46.0 %  ? MCV 101.5 (H) 80.0 - 100.0 fL  ? MCH  31.7 26.0 - 34.0 pg  ? MCHC 31.3 30.0 - 36.0 g/dL  ? RDW 13.1 11.5 - 15.5 %  ? Platelets 528 (H) 150 - 400 K/uL  ? nRBC 0.9 (H) 0.0 - 0.2 %  ? Neutrophils Relative % 34 %  ? Neutro Abs 3.1 1.7 - 7.7 K/uL  ? Band Neutrophils 11 %  ? Lymphocytes Relative 12 %  ? Lymphs Abs 0.8 0.7 - 4.0 K/uL  ? Monocytes Relative 29 %  ? Monocytes Absolute 2.0 (H) 0.1 - 1.0 K/uL  ? Eosinophils Relative 0 %  ? Eosinophils Absolute 0.0 0.0 - 0.5 K/uL  ? Basophils Relative 0 %  ? Basophils Absolute 0.0 0.0 - 0.1 K/uL  ? WBC Morphology    ?  MODERATE LEFT SHIFT (>5% METAS AND MYELOS,OCC PRO NOTED)  ? RBC Morphology MORPHOLOGY UNREMARKABLE   ? Smear Review PLATELETS APPEAR INCREASED   ? nRBC 1 (H) 0 /100 WBC  ? Metamyelocytes Relative 3 %  ? Myelocytes 8 %  ? Blasts 3 %  ? Abs Immature Granulocytes 0.70 (H) 0.00 - 0.07 K/uL  ?Comprehensive metabolic panel     Status: Abnormal  ? Collection Time: 09/15/21 12:20 AM  ?Result Value Ref Range  ? Sodium 134 (L) 135 - 145 mmol/L  ? Potassium 3.9 3.5 - 5.1 mmol/L  ? Chloride 101 98 - 111 mmol/L  ? CO2 20 (L) 22 - 32 mmol/L  ? Glucose, Bld 123 (H) 70 - 99 mg/dL  ? BUN 17 8 - 23 mg/dL  ? Creatinine, Ser 0.85 0.44 - 1.00 mg/dL  ? Calcium 7.2 (L) 8.9 - 10.3 mg/dL  ? Total Protein 5.6 (L) 6.5 - 8.1 g/dL  ? Albumin 2.3 (L) 3.5 - 5.0 g/dL  ? AST 23 15 - 41 U/L  ? ALT 23 0 - 44 U/L  ? Alkaline Phosphatase 174 (H) 38 - 126 U/L  ? Total Bilirubin 0.3 0.3 - 1.2 mg/dL  ? GFR, Estimated >60 >60 mL/min  ? Anion gap 13 5 - 15  ?Lactic acid, plasma     Status: None  ? Collection Time: 09/15/21  1:00 AM  ?Result Value Ref Range  ? Lactic Acid, Venous 1.7 0.5 - 1.9 mmol/L  ?I-stat chem 8, ED (not at [facility] or [facility])     Status: Abnormal  ? Collection Time: 09/15/21  1:15 AM  ?Result Value Ref Range  ? Sodium 134 (L) 135 - 145 mmol/L  ?  Potassium 4.1 3.5 - 5.1 mmol/L  ? Chloride 100 98 - 111 mmol/L  ? BUN 15 8 - 23 mg/dL  ? Creatinine, Ser 0.90 0.44 - 1.00 mg/dL  ? Glucose, Bld 116 (H) 70 - 99 mg/dL  ? Calcium, Ion 1.08 (L) 1.15 - 1.40 mmol

## 2021-09-15 NOTE — ED Provider Notes (Signed)
[facility]-EMERGENCY DEPT Provider Note  CSN: [id number] Arrival date & time: 09/14/21 2348  Chief Complaint(s) Near Syncope  HPI Cheryl Singh is a 71 y.o. female with a past medical history as listed below including metastatic bladder cancer currently undergoing chemotherapy who presents to the emergency department after near syncopal episode.  Patient reports that she thinks he might of taken her nighttime morphine before going to bed.  While in bed she started feeling lightheaded and cold.  She began shaking.  When her roommate he came in they attempted to go to the bathroom.  Upon standing patient felt lightheaded.  She was unable to get off the toilet.  She was complaining of shortness of breath.  No chest pain.  No nausea or vomiting.  No diarrhea.  Patient denies any melanotic or bloody bowel movements.  EMS called and noted patient was hypotensive with systolics in the 80s, tachycardic with rates in the 150s.  She was given 1 L of IV fluids and blood pressure improved.  The history is provided by the patient.   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 Authorizing Provider  amLODipine (NORVASC) 5 MG tablet Take 5 mg by mouth daily. 07/12/21  Yes [provider]   atorvastatin (LIPITOR) 10 MG tablet Take 10 mg by mouth every evening.   Yes [provider]  celecoxib (CELEBREX) 200 MG capsule Take 1 capsule (200 mg total) by mouth 2 (two) times daily. 09/04/21  Yes Josph Macho, MD  Cholecalciferol (VITAMIN D3) 50 MCG (2000 UT) capsule Take 2,000 Units by mouth daily.   Yes [provider]  dexamethasone (DECADRON) 4 MG tablet Take 2 tablets (8 mg total) by mouth daily. Take daily x 3 days starting the day after cisplatin chemotherapy. Take with food. 08/22/21  Yes Josph Macho, MD  famotidine (PEPCID) 40 MG tablet Take 1 tablet (40 mg total) by mouth 2 (two) times daily. 09/04/21  Yes Josph Macho, MD  lidocaine-prilocaine (EMLA) cream Apply to affected area once 08/22/21  Yes Ennever, Rose Phi, MD  losartan (COZAAR) 100 MG tablet Take 100 mg by mouth daily at 6 (six) AM.   Yes [provider]  morphine (MS CONTIN) 15 MG 12 hr tablet Take 1 tablet (15 mg total) by mouth every 12 (twelve) hours. Patient taking differently: Take 15 mg by mouth daily. 09/13/21  Yes Pickenpack-Cousar, Arty Baumgartner, NP  Omega-3 Fatty Acids (FISH OIL) 1000 MG CAPS Take 1,000 mg by mouth daily.   Yes [provider]  ondansetron (ZOFRAN) 8 MG tablet Take 1 tablet (8 mg total) by mouth 2 (two) times daily as needed. Start on the third day after cisplatin chemotherapy. 08/22/21  Yes Ennever, Rose Phi, MD  oxyCODONE-acetaminophen (PERCOCET/ROXICET) 5-325 MG tablet Take 1-2 tablets by mouth every 4 (  four) hours as needed for severe pain or moderate pain. 09/13/21  Yes Pickenpack-Cousar, Arty Baumgartner, NP  polyethylene glycol (MIRALAX / GLYCOLAX) 17 g packet Take 17 g by mouth daily as needed for mild constipation.   Yes [provider]  prochlorperazine (COMPAZINE) 10 MG tablet Take 1 tablet (10 mg total) by mouth every 6 (six) hours as needed (Nausea or vomiting). 08/22/21  Yes Josph Macho, MD  FOLIC ACID PO Take 1 tablet by mouth daily. Patient  not taking: Reported on 09/15/2021    [provider]  Lactobacillus-Inulin (CULTURELLE DIGESTIVE DAILY PO) Take 1 Package by mouth daily. Patient not taking: Reported on 09/15/2021    [provider]                                                                                                                                    Allergies Patient has no known allergies.  Review of Systems Review of Systems As noted in HPI  Physical Exam Vital Signs  I have reviewed the triage vital signs BP 122/68   Pulse 90   Temp 98.5 F (36.9 C)   Resp 17   SpO2 94%   Physical Exam Vitals reviewed.  Constitutional:      General: She is not in acute distress.    Appearance: She is well-developed. She is not diaphoretic.  HENT:     Head: Normocephalic and atraumatic.     Nose: Nose normal.  Eyes:     General: No scleral icterus.       Right eye: No discharge.        Left eye: No discharge.     Conjunctiva/sclera: Conjunctivae normal.     Pupils: Pupils are equal, round, and reactive to light.  Cardiovascular:     Rate and Rhythm: Regular rhythm. Tachycardia present.     Heart sounds: No murmur heard.   No friction rub. No gallop.  Pulmonary:     Effort: Pulmonary effort is normal. No respiratory distress.     Breath sounds: Normal breath sounds. No stridor. No rales.  Chest:     Chest wall: Tenderness present.    Abdominal:     General: There is no distension.     Palpations: Abdomen is soft.     Tenderness: There is no abdominal tenderness.  Musculoskeletal:        General: No tenderness.     Cervical back: Normal range of motion and neck supple.     Right lower leg: No edema.     Left lower leg: No edema.  Skin:    General: Skin is warm and dry.     Findings: No erythema or rash.  Neurological:     Mental Status: She is alert and oriented to person, place, and time.    ED Results and Treatments Labs (all labs ordered are listed, but only abnormal  results are displayed) Labs  Reviewed  CBC WITH DIFFERENTIAL/PLATELET - Abnormal; Notable for the following components:      Result Value   RBC 2.68 (*)    Hemoglobin 8.5 (*)    HCT 27.2 (*)    MCV 101.5 (*)    Platelets 528 (*)    nRBC 0.9 (*)    Monocytes Absolute 2.0 (*)    nRBC 1 (*)    Abs Immature Granulocytes 0.70 (*)    All other components within normal limits  COMPREHENSIVE METABOLIC PANEL - Abnormal; Notable for the following components:   Sodium 134 (*)    CO2 20 (*)    Glucose, Bld 123 (*)    Calcium 7.2 (*)    Total Protein 5.6 (*)    Albumin 2.3 (*)    Alkaline Phosphatase 174 (*)    All other components within normal limits  URINALYSIS, ROUTINE W REFLEX MICROSCOPIC - Abnormal; Notable for the following components:   Color, Urine AMBER (*)    APPearance CLOUDY (*)    Specific Gravity, Urine 1.044 (*)    Hgb urine dipstick MODERATE (*)    Protein, ur 100 (*)    Leukocytes,Ua MODERATE (*)    WBC, UA >50 (*)    Bacteria, UA MANY (*)    Non Squamous Epithelial 0-5 (*)    All other components within normal limits  I-STAT CHEM 8, ED - Abnormal; Notable for the following components:   Sodium 134 (*)    Glucose, Bld 116 (*)    Calcium, Ion 1.08 (*)    Hemoglobin 7.8 (*)    HCT 23.0 (*)    All other components within normal limits  RESP PANEL BY RT-PCR (FLU A&B, COVID) ARPGX2  CULTURE, BLOOD (ROUTINE X 2)  CULTURE, BLOOD (ROUTINE X 2)  URINE CULTURE  LACTIC ACID, PLASMA  TYPE AND SCREEN                                                                                                                         EKG  EKG Interpretation  Date/Time:  Saturday September 15 2021 02:42:08 EDT Ventricular Rate:  98 PR Interval:  150 QRS Duration: 92 QT Interval:  348 QTC Calculation: 445 R Axis:   76 Text Interpretation: Sinus rhythm Borderline low voltage, extremity leads No acute changes Confirmed by Drema Pry [phone]) on 09/15/2021 4:12:50 AM        Radiology CT Angio Chest PE W and/or Wo Contrast  Result Date: 09/15/2021 CLINICAL DATA:  Shortness of breath, tremors, metastatic bladder cancer, evaluate for PE EXAM: CT ANGIOGRAPHY CHEST WITH CONTRAST TECHNIQUE: Multidetector CT imaging of the chest was performed using the standard protocol during bolus administration of intravenous contrast. Multiplanar CT image reconstructions and MIPs were obtained to evaluate the vascular anatomy. RADIATION DOSE REDUCTION: This exam was performed according to the departmental dose-optimization program which includes automated exposure control, adjustment of the mA and/or kV according to patient size and/or use of iterative reconstruction technique. CONTRAST:  75mL OMNIPAQUE IOHEXOL 350 MG/ML SOLN COMPARISON:  PET-CT dated 08/31/2021 FINDINGS: Cardiovascular: Satisfactory opacification of the bilateral pulmonary arteries to the segmental level. No evidence of pulmonary embolism. Although not tailored for evaluation of the thoracic aorta, there is no evidence of thoracic aortic aneurysm or dissection. Aberrant right subclavian artery. The heart is normal in size.  No pericardial effusion. Coronary atherosclerosis of the LAD. Right chest port terminates at the cavoatrial junction. Mediastinum/Nodes: Mild left perivertebral soft tissue in the posterior mediastinum at the T8 level (series 4/image 34), corresponding to the suspected posterior mediastinal node on recent PET. Otherwise, no suspicious mediastinal lymphadenopathy. Visualized thyroid is unremarkable. Lungs/Pleura: Evaluation lung parenchyma is constrained by respiratory motion. Within that constraint, there are no suspicious pulmonary nodules. No focal consolidation. No pleural effusion or pneumothorax. Upper Abdomen: At least one right hepatic lobe metastasis is evident on the current study (series 4/image 118), although the other known lesions are not well visualized, likely due to phase of enhancement.  Visualized upper abdomen is otherwise grossly unremarkable. Musculoskeletal: Left lateral 7th rib fracture (series 4/image 86), new, likely pathologic. Multifocal sclerotic osseous metastases in the visualized axial and appendicular skeleton, better evaluated on recent PET. Review of the MIP images confirms the above findings. IMPRESSION: No evidence of pulmonary embolism. New left lateral 7th rib fracture, likely pathologic. Multifocal sclerotic osseous metastases, better evaluated on recent PET. Known hepatic metastases are better evaluated on recent PET. Aortic Atherosclerosis (ICD10-I70.0). Electronically Signed   By: Charline Bills M.D.   On: 09/15/2021 02:34   DG Chest Port 1 View  Result Date: 09/15/2021 CLINICAL DATA:  Pt BIB [facility] from home with SOB and tremors. Hx HTN and metatstatic bladder cancer. Initially pale, hypotensive (80/60) and tachycardic (150) on EMS arrival. EXAM: PORTABLE CHEST 1 VIEW.  Patient is rotated. COMPARISON:  Chest, abdomen, pelvis 08/02/2021 FINDINGS: Right chest wall Port-A-Cath with tip overlying the expected region of the distal superior vena cava. The heart and mediastinal contours are within normal limits. No focal consolidation. No pulmonary edema. No pleural effusion. No pneumothorax. No acute osseous abnormality.  Right shoulder degenerative changes. IMPRESSION: No active disease with slightly limited evaluation due to patient rotation. Electronically Signed   By: Tish Frederickson M.D.   On: 09/15/2021 01:03    Pertinent labs & imaging results that were available during my care of the patient were reviewed by me and considered in my medical decision making (see MDM for details).  Medications Ordered in ED Medications  cefTRIAXone (ROCEPHIN) 1 g in sodium chloride 0.9 % 100 mL IVPB (has no administration in time range)  sodium chloride 0.9 % bolus 1,000 mL (0 mLs Intravenous Stopped 09/15/21 0140)    Followed by  0.9 %  sodium chloride infusion (1,000 mLs  Intravenous Other (enter comment in med admin window) 09/15/21 0654)  iohexol (OMNIPAQUE) 350 MG/ML injection 75 mL (75 mLs Intravenous Contrast Given 09/15/21 0207)  sodium chloride 0.9 % bolus 1,000 mL (0 mLs Intravenous Stopped 09/15/21 0727)  Procedures .Critical Care Performed by: Nira Conn, MD Authorized by: Nira Conn, MD   Critical care provider statement:    Critical care time (minutes):  75   Critical care time was exclusive of:  Separately billable procedures and treating other patients   Critical care was necessary to treat or prevent imminent or life-threatening deterioration of the following conditions:  Circulatory failure   Critical care was time spent personally by me on the following activities:  Development of treatment plan with patient or surrogate, discussions with consultants, evaluation of patient's response to treatment, examination of patient, obtaining history from patient or surrogate, review of old charts, re-evaluation of patient's condition, pulse oximetry, ordering and review of radiographic studies, ordering and review of laboratory studies and ordering and performing treatments and interventions   Care discussed with: admitting provider    (including critical care time)  Medical Decision Making / ED Course    Complexity of Problem:  Co-morbidities/SDOH that complicate the patient evaluation/care: Noted in HPI  Additional history obtained: None  Patient's presenting problem/concern and DDX listed below: Near syncope Medication overdose, dehydration, PE, electrolyte/metabolic derangements. Will assess for infection Patient will be provided with IV fluids in the interim.     Complexity of Data:   Cardiac Monitoring: The patient was maintained on a cardiac monitor.   I personally viewed and  interpreted the cardiac monitored which showed an underlying rhythm of sinus tachycardia without dysrhythmias  Laboratory Tests ordered listed below with my independent interpretation: CBC without leukocytosis.  Hemoglobin 8.5 which is 1-1/2 g drop since last week. No significant electrolyte derangements or renal sufficiency. Lactic acid negative.   Imaging Studies ordered listed below with my independent interpretation: Chest x-ray without evidence of pneumonia. CTA negative for PE or pneumonia.  Radiology noted a pathologic left seventh rib fracture.     ED Course:    Hospitalization Considered:  Yes  Assessment, Intervention, and Reassessment: Near syncope. Patient blood pressure remained hemodynamically stable and tachycardia improved. Work-up reassuring. No obvious signs of infection. Still pending urine. Attempted to ambulate the patient, but she became severely tachypneic, tachycardic, and hypoxic down to the 80s. Given additional IV fluids and placed on supplemental oxygen. Admitted to medicine for further work-up and management.    Final Clinical Impression(s) / ED Diagnoses Final diagnoses:  Near syncope  Orthostatic hypotension           This chart was dictated using voice recognition software.  Despite best efforts to proofread,  errors can occur which can change the documentation meaning.    Nira Conn, MD 09/15/21 [phone]

## 2021-09-15 NOTE — ED Notes (Signed)
Patient reports no known fevers or sick contacts.  C/o "shaking at home."  Having episodes of SHOB.  No reports of cough.   ?

## 2021-09-15 NOTE — H&P (Signed)
?History and Physical  ? ? ?Patient: Cheryl Singh NID:[id number] DOB: Nov 18, 1950 ?DOA: 09/14/2021 ?DOS: the patient was seen and examined on 09/15/2021 ?PCP: Glenis Smoker, MD  ?Patient coming from: Home ? ?Chief Complaint:  ?Chief Complaint  ?Patient presents with  ? Near Syncope  ? ?HPI: Cheryl Singh is a 71 y.o. female with medical history significant of stage 4 bladder cancer, HTN, HLD, GERD. Presenting with chills, weakness. Reports that she over the last coupe of days, she has felt chills. She attributes it to an increase in pain medications. She hasn't had any recorded fevers. She hasn't had any dysuria, respiratory symptoms, new rashes, or diarrhea. She reports early this morning she began feeling lightheaded and weak. Her roommate helped her to the bathroom. She was unable to get off the toilet herself as her lightheadedness worsened and she became short of breath. She became concerned and came to the ED for assistance. She denies any other aggravating or alleviating factors.   ? ?Review of Systems: As mentioned in the history of present illness. All other systems reviewed and are negative. ?Past Medical History:  ?Diagnosis Date  ? Arthritis   ? 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 Goals of care, counseling/discussion 08/16/2021  ? Hypertension   ? ?Past Surgical History:  ?Procedure Laterality Date  ? HEART CHAMBER REVISION    ? IR IMAGING GUIDED PORT INSERTION  08/20/2021  ? TRANSURETHRAL RESECTION OF BLADDER TUMOR Bilateral 07/17/2021  ? Procedure: CYSTOSCOPY BILATERAL RETROGRADES TRANSURETHRAL RESECTION OF BLADDER TUMOR (TURBT);  Surgeon: Remi Haggard, MD;  Location: [facility];  Service: Urology;  Laterality: Bilateral;  ? ?Social History:  reports that she has been smoking cigarettes. She has a 20.00 pack-year smoking history. She has never  used smokeless tobacco. She reports current alcohol use. She reports that she does not use drugs. ? ?No Known Allergies ? ?History reviewed. No pertinent family history. ? ?Prior to Admission medications   ?Medication Sig Start Date End Date Taking? Authorizing Provider  ?amLODipine (NORVASC) 5 MG tablet Take 5 mg by mouth daily. 07/12/21  Yes [provider]  ?atorvastatin (LIPITOR) 10 MG tablet Take 10 mg by mouth every evening.   Yes [provider]  ?celecoxib (CELEBREX) 200 MG capsule Take 1 capsule (200 mg total) by mouth 2 (two) times daily. 09/04/21  Yes Volanda Napoleon, MD  ?Cholecalciferol (VITAMIN D3) 50 MCG (2000 UT) capsule Take 2,000 Units by mouth daily.   Yes [provider]  ?dexamethasone (DECADRON) 4 MG tablet Take 2 tablets (8 mg total) by mouth daily. Take daily x 3 days starting the day after cisplatin chemotherapy. Take with food. 08/22/21  Yes Volanda Napoleon, MD  ?famotidine (PEPCID) 40 MG tablet Take 1 tablet (40 mg total) by mouth 2 (two) times daily. 09/04/21  Yes Volanda Napoleon, MD  ?lidocaine-prilocaine (EMLA) cream Apply to affected area once 08/22/21  Yes Ennever, Rudell Cobb, MD  ?losartan (COZAAR) 100 MG tablet Take 100 mg by mouth daily at 6 (six) AM.   Yes [provider]  ?morphine (MS CONTIN) 15 MG 12 hr tablet Take 1 tablet (15 mg total) by mouth every 12 (twelve) hours. ?Patient taking differently: Take 15 mg by mouth daily. 09/13/21  Yes Pickenpack-Cousar, Carlena Sax, NP  ?Omega-3 Fatty Acids (FISH OIL) 1000 MG CAPS Take 1,000 mg by  mouth daily.   Yes [provider]  ?ondansetron (ZOFRAN) 8 MG tablet Take 1 tablet (8 mg total) by mouth 2 (two) times daily as needed. Start on the third day after cisplatin chemotherapy. 08/22/21  Yes Volanda Napoleon, MD  ?oxyCODONE-acetaminophen (PERCOCET/ROXICET) 5-325 MG tablet Take 1-2 tablets by mouth every 4 (four) hours as needed for severe pain or moderate pain. 09/13/21  Yes Pickenpack-Cousar, Carlena Sax, NP  ?polyethylene glycol (MIRALAX / GLYCOLAX) 17 g packet Take 17 g by mouth daily as needed for mild constipation.   Yes [provider]  ?prochlorperazine (COMPAZINE) 10 MG tablet Take 1 tablet (10 mg total) by mouth every 6 (six) hours as needed (Nausea or vomiting). 08/22/21  Yes Volanda Napoleon, MD  ?FOLIC ACID PO Take 1 tablet by mouth daily. ?Patient not taking: Reported on 09/15/2021    [provider]  ?Lactobacillus-Inulin (CULTURELLE DIGESTIVE DAILY PO) Take 1 Package by mouth daily. ?Patient not taking: Reported on 09/15/2021    [provider]  ? ? ?Physical Exam: ?Vitals:  ? 09/15/21 0100 09/15/21 0130 09/15/21 0330 09/15/21 0500  ?BP: 103/65 101/62 114/71 (!) 116/94  ?Pulse: (!) 106 (!) 102 91 (!) 113  ?Resp:   18 (!) 28  ?Temp:      ?TempSrc:      ?SpO2: 99% 96% 100% (!) 89%  ? ?General: 71 y.o. female resting in bed in NAD ?Eyes: PERRL, normal sclera ?ENMT: Nares patent w/o discharge, orophaynx clear, dentition normal, ears w/o discharge/lesions/ulcers ?Neck: Supple, trachea midline ?Cardiovascular: RRR, +S1, S2, no m/g/r, equal pulses throughout ?Respiratory: CTABL, no w/r/r, normal WOB ?GI: BS+, NDNT, no masses noted, no organomegaly noted ?MSK: No e/c/c ?Neuro: A&O x 3, no focal deficits ?Psyc: Appropriate interaction and affect, calm/cooperative ? ?Data Reviewed: ? ?Na+  134 ?BUN 15 ?Lactic acid 1.7 ?Hgb 7.8 ? ?CTA chest: No evidence of pulmonary embolism. New left lateral 7th rib fracture, likely pathologic. Multifocal sclerotic osseous metastases, better evaluated on recent PET. Known hepatic metastases are better evaluated on recent PET. Aortic Atherosclerosis (ICD10-I70.0). ? ?Assessment and Plan: ?No notes have been filed under this hospital service. ?Service: Hospitalist ?UTI ?    - placed in obs, tele ?    - UA is dirty; Ucx pending, starting rocephin ?    - continue fluids ? ?Near syncope ?    - likely secondary to UTI; possibly multifactorial w/ ?symptomatic  anemia ?    - fluids as she was orthostatic positive ?    - follow H&H and transfuse as necessary ? ?Macrocytic anemia ??Symptomatic anemia ?    - baseline is about 12; she has trended down over the last month; she's 7.8 today ?    - No dark or bloody stools; no gross hematuria ?    - UA w/ red cells ?    - type and screen ?    - check FOBT ?    - spoke with urology; they will review, appreciate assistance ?    - trend Hgb ?    - check iron studies ? ?Hypocalcemia ?    - Ca2+ corrects to 8.6; can add TUMS ? ?Stage 4 bladder cancer ?    - on chemo; follows with Dr. Marin Olp ?    - will add to care team ? ?HTN ?    - hypotensive at admission, hold home meds, getting fluids ? ?HLD ?    - continue statin ? ?GERD ?    -  continue PPI ? ? Advance Care Planning:   Code Status: FULL ? ?Consults: Urology ? ?Family Communication: None at bedside ? ?Severity of Illness: ?The appropriate patient status for this patient is OBSERVATION. Observation status is judged to be reasonable and necessary in order to provide the required intensity of service to ensure the patient's safety. The patient's presenting symptoms, physical exam findings, and initial radiographic and laboratory data in the context of their medical condition is felt to place them at decreased risk for further clinical deterioration. Furthermore, it is anticipated that the patient will be medically stable for discharge from the hospital within 2 midnights of admission.  ? ?Author: ?Jonnie Finner, DO ?09/15/2021 7:17 AM ? ?For on call review [email or web address].  ?

## 2021-09-16 DIAGNOSIS — R3129 Other microscopic hematuria: Secondary | ICD-10-CM | POA: Diagnosis present

## 2021-09-16 DIAGNOSIS — C775 Secondary and unspecified malignant neoplasm of intrapelvic lymph nodes: Secondary | ICD-10-CM | POA: Diagnosis present

## 2021-09-16 DIAGNOSIS — D649 Anemia, unspecified: Secondary | ICD-10-CM | POA: Diagnosis present

## 2021-09-16 DIAGNOSIS — J9601 Acute respiratory failure with hypoxia: Secondary | ICD-10-CM

## 2021-09-16 DIAGNOSIS — C679 Malignant neoplasm of bladder, unspecified: Secondary | ICD-10-CM

## 2021-09-16 DIAGNOSIS — G893 Neoplasm related pain (acute) (chronic): Secondary | ICD-10-CM

## 2021-09-16 DIAGNOSIS — M8458XA Pathological fracture in neoplastic disease, other specified site, initial encounter for fracture: Secondary | ICD-10-CM | POA: Diagnosis present

## 2021-09-16 DIAGNOSIS — E785 Hyperlipidemia, unspecified: Secondary | ICD-10-CM | POA: Diagnosis present

## 2021-09-16 DIAGNOSIS — M1990 Unspecified osteoarthritis, unspecified site: Secondary | ICD-10-CM | POA: Diagnosis present

## 2021-09-16 DIAGNOSIS — I10 Essential (primary) hypertension: Secondary | ICD-10-CM | POA: Diagnosis present

## 2021-09-16 DIAGNOSIS — I951 Orthostatic hypotension: Secondary | ICD-10-CM

## 2021-09-16 DIAGNOSIS — C7951 Secondary malignant neoplasm of bone: Secondary | ICD-10-CM

## 2021-09-16 DIAGNOSIS — Z5111 Encounter for antineoplastic chemotherapy: Secondary | ICD-10-CM | POA: Diagnosis not present

## 2021-09-16 DIAGNOSIS — Z510 Encounter for antineoplastic radiation therapy: Secondary | ICD-10-CM | POA: Diagnosis not present

## 2021-09-16 DIAGNOSIS — I491 Atrial premature depolarization: Secondary | ICD-10-CM | POA: Diagnosis present

## 2021-09-16 DIAGNOSIS — K219 Gastro-esophageal reflux disease without esophagitis: Secondary | ICD-10-CM | POA: Diagnosis present

## 2021-09-16 DIAGNOSIS — R531 Weakness: Secondary | ICD-10-CM

## 2021-09-16 DIAGNOSIS — E860 Dehydration: Secondary | ICD-10-CM | POA: Diagnosis present

## 2021-09-16 DIAGNOSIS — E611 Iron deficiency: Secondary | ICD-10-CM | POA: Diagnosis present

## 2021-09-16 DIAGNOSIS — E876 Hypokalemia: Secondary | ICD-10-CM | POA: Diagnosis not present

## 2021-09-16 DIAGNOSIS — N390 Urinary tract infection, site not specified: Secondary | ICD-10-CM | POA: Diagnosis present

## 2021-09-16 DIAGNOSIS — N3001 Acute cystitis with hematuria: Secondary | ICD-10-CM

## 2021-09-16 DIAGNOSIS — Z20822 Contact with and (suspected) exposure to covid-19: Secondary | ICD-10-CM | POA: Diagnosis present

## 2021-09-16 DIAGNOSIS — S2232XA Fracture of one rib, left side, initial encounter for closed fracture: Secondary | ICD-10-CM

## 2021-09-16 DIAGNOSIS — Z95828 Presence of other vascular implants and grafts: Secondary | ICD-10-CM | POA: Diagnosis not present

## 2021-09-16 DIAGNOSIS — F17210 Nicotine dependence, cigarettes, uncomplicated: Secondary | ICD-10-CM | POA: Diagnosis present

## 2021-09-16 DIAGNOSIS — C787 Secondary malignant neoplasm of liver and intrahepatic bile duct: Secondary | ICD-10-CM | POA: Diagnosis present

## 2021-09-16 LAB — HEMOGLOBIN AND HEMATOCRIT, BLOOD
HCT: 23.9 % — ABNORMAL LOW (ref 36.0–46.0)
HCT: 30.1 % — ABNORMAL LOW (ref 36.0–46.0)
Hemoglobin: 7.4 g/dL — ABNORMAL LOW (ref 12.0–15.0)
Hemoglobin: 9.4 g/dL — ABNORMAL LOW (ref 12.0–15.0)

## 2021-09-16 LAB — COMPREHENSIVE METABOLIC PANEL
ALT: 19 U/L (ref 0–44)
AST: 20 U/L (ref 15–41)
Albumin: 2 g/dL — ABNORMAL LOW (ref 3.5–5.0)
Alkaline Phosphatase: 161 U/L — ABNORMAL HIGH (ref 38–126)
Anion gap: 9 (ref 5–15)
BUN: 15 mg/dL (ref 8–23)
CO2: 24 mmol/L (ref 22–32)
Calcium: 8.2 mg/dL — ABNORMAL LOW (ref 8.9–10.3)
Chloride: 109 mmol/L (ref 98–111)
Creatinine, Ser: 0.61 mg/dL (ref 0.44–1.00)
GFR, Estimated: >60 mL/min (ref >60)
Glucose, Bld: 125 mg/dL — ABNORMAL HIGH (ref 70–99)
Potassium: 3.7 mmol/L (ref 3.5–5.1)
Sodium: 142 mmol/L (ref 135–145)
Total Bilirubin: <0.1 mg/dL — ABNORMAL LOW (ref 0.3–1.2)
Total Protein: 5.6 g/dL — ABNORMAL LOW (ref 6.5–8.1)

## 2021-09-16 LAB — URINE CULTURE

## 2021-09-16 LAB — CBC
HCT: 24.2 % — ABNORMAL LOW (ref 36.0–46.0)
Hemoglobin: 7.5 g/dL — ABNORMAL LOW (ref 12.0–15.0)
MCH: 31.5 pg (ref 26.0–34.0)
MCHC: 31 g/dL (ref 30.0–36.0)
MCV: 101.7 fL — ABNORMAL HIGH (ref 80.0–100.0)
Platelets: 605 10*3/uL — ABNORMAL HIGH (ref 150–400)
RBC: 2.38 MIL/uL — ABNORMAL LOW (ref 3.87–5.11)
RDW: 13.4 % (ref 11.5–15.5)
WBC: 9.6 10*3/uL (ref 4.0–10.5)
nRBC: 0.7 % — ABNORMAL HIGH (ref 0.0–0.2)

## 2021-09-16 LAB — PREPARE RBC (CROSSMATCH)

## 2021-09-16 MED ORDER — SODIUM CHLORIDE 0.9% IV SOLUTION
Freq: Once | INTRAVENOUS | Status: AC
  Administered 2021-09-16: 250 mL via INTRAVENOUS

## 2021-09-16 MED ORDER — ALUM & MAG HYDROXIDE-SIMETH 200-200-20 MG/5ML PO SUSP
30.0000 mL | ORAL | Status: DC | PRN
  Administered 2021-09-16 – 2021-09-22 (×7): 30 mL via ORAL
  Filled 2021-09-16 (×7): qty 30

## 2021-09-16 MED ORDER — IPRATROPIUM-ALBUTEROL 0.5-2.5 (3) MG/3ML IN SOLN: 3.0000 mL | RESPIRATORY_TRACT | Status: DC | PRN

## 2021-09-16 MED ORDER — LOSARTAN POTASSIUM 50 MG PO TABS
100.0000 mg | ORAL_TABLET | Freq: Every day | ORAL | Status: DC
  Administered 2021-09-16 – 2021-09-22 (×6): 100 mg via ORAL
  Filled 2021-09-16 (×6): qty 2

## 2021-09-16 MED ORDER — UMECLIDINIUM-VILANTEROL 62.5-25 MCG/ACT IN AEPB
1.0000 | INHALATION_SPRAY | Freq: Every day | RESPIRATORY_TRACT | Status: DC
  Filled 2021-09-16: qty 14

## 2021-09-16 MED ORDER — HYDRALAZINE HCL 25 MG PO TABS: 25.0000 mg | ORAL_TABLET | Freq: Four times a day (QID) | ORAL | Status: DC | PRN

## 2021-09-16 MED ORDER — AMLODIPINE BESYLATE 5 MG PO TABS
5.0000 mg | ORAL_TABLET | Freq: Every day | ORAL | Status: DC
  Administered 2021-09-16: 5 mg via ORAL
  Filled 2021-09-16: qty 1

## 2021-09-16 MED ORDER — LOSARTAN POTASSIUM 50 MG PO TABS: 100.0000 mg | ORAL_TABLET | Freq: Every day | ORAL | Status: DC

## 2021-09-16 NOTE — Assessment & Plan Note (Signed)
Corrects to normal for hypoalbuminemia. ?

## 2021-09-16 NOTE — Assessment & Plan Note (Addendum)
Blood pressure slightly elevated. ?-Increased home amlodipine ?-Continue home losartan 100 mg daily ?-Continue p.o. hydralazine as needed with parameters. ?

## 2021-09-16 NOTE — Assessment & Plan Note (Addendum)
Desaturated to 84% with ambulation on room air.  Not on oxygen at home.  No respiratory symptoms.  No history of COPD, asthma and CHF but lifelong smoker.  CTA chest negative for PE or pneumonia. ?-Continue LAMA/LABA and DuoNeb as needed ?-Incentive spirometry, OOB ?-Wean oxygen as able.  May need home oxygen on discharge. ?

## 2021-09-16 NOTE — Assessment & Plan Note (Addendum)
Oncology and radiation oncology following. ?-Starting radiation treatment ?

## 2021-09-16 NOTE — Progress Notes (Signed)
?PROGRESS NOTE ? ?Cheryl Singh UXL:[id number] DOB: [birth year]  ? ?PCP: Glenis Smoker, MD ? ?Patient is from: Home.  Lives with friend. ? ?DOA: 09/14/2021 LOS: 0 ? ?Chief complaints ?Chief Complaint  ?Patient presents with  ? Near Syncope  ?  ? ?Brief Narrative / Interim history: ?71 year old F with PMH of stage IV bladder cancer s/p TURBT last month, anemia, HTN, HLD and GERD presenting with generalized weakness, chills, shaking, lightheadedness, shortness of breath and abdominal bloating/pain, and admitted for near syncope, orthostatic hypotension and possible UTI.  Patient had pain medication increased recently.  UA concerning for UTI but she denies UTI symptoms.  Urology consulted and recommended empiric antibiotics.  She was started on IV ceftriaxone and IV fluid and admitted. ? ?The next day, patient was ambulated and desaturated to 84% on room air.  She has no history of COPD, asthma or CHF lifelong smoking history.  Hemoglobin trended down to 7.4.  She has had turbid about a month ago.  Some microscopic hematuria on UA but no gross hematuria.  She denies melena or hematochezia.  Transfusing 1 unit ?  ? ?Subjective: ?Seen and examined earlier this morning.  No major events overnight or this morning.  She reports left-sided back pain with ambulation.  She is not in pain sitting.  No radiation to her leg.  She denies chest pain, cough, sore throat, nausea, vomiting or diarrhea.  She had abdominal pain and bloating that has resolved.  She reports shortness of breath with activity.  She denies fever, melena or hematochezia.  Denies gross hematuria..  Anxious to go home given his shortness of breath and weakness.  ? ?Objective: ?Vitals:  ? 09/15/21 1602 09/15/21 1938 09/16/21 0418 09/16/21 0418  ?BP: 127/67 (!) 150/58 136/73 136/73  ?Pulse: (!) 101 (!) 108 97 97  ?Resp: '18 14 14 14  '$ ?Temp: 98.8 ?F (37.1 ?C) 98 ?F (36.7 ?C) (!) 97.5 ?F (36.4 ?C) (!) 97.5 ?F (36.4 ?C)  ?TempSrc: Oral Oral Oral Oral  ?SpO2:  99% 99% 100% 100%  ? ? ?Examination: ? ?GENERAL: No apparent distress.  Nontoxic. ?HEENT: MMM.  Vision and hearing grossly intact.  ?NECK: Supple.  No apparent JVD.  ?RESP: 92% on RA at rest.  No IWOB.  Fair aeration bilaterally. ?CVS:  RRR. Heart sounds normal.  ?ABD/GI/GU: BS+. Abd soft, NTND.  ?MSK/EXT:  Moves extremities. No apparent deformity. No edema.  ?SKIN: no apparent skin lesion or wound ?NEURO: Awake, alert and oriented appropriately.  No apparent focal neuro deficit. ?PSYCH: Calm. Normal affect.  ? ?Procedures:  ?None ? ?Microbiology summarized: ?COVID-19 and influenza PCR nonreactive. ?Blood cultures NGTD. ?Urine culture pending. ? ?Assessment and Plan: ?* Orthostatic hypotension ?This is likely due to symptomatic anemia and recent increase of opiate medication.  Orthostatic vitals from lying to sitting but not with 0 minutes standing.  ?-Transfusing for symptomatic anemia ?-Decrease opiates ?-Requested to complete orthostatic vitals ?-Fall precaution ? ?Symptomatic anemia ?Recent Labs  ?  06/15/21 ?1430 07/13/21 ?1000 08/16/21 ?1104 08/28/21 ?0816 09/04/21 ?1102 09/15/21 ?0020 09/15/21 ?0115 09/15/21 ?1328 09/15/21 ?2008 09/16/21 ?0243  ?HGB 12.3 12.2 11.4* 10.2* 10.0* 8.5* 7.8* 7.9* 7.9* 7.4*  7.5*  ?Patient with lightheadedness, shortness of breath and generalized weakness.  Denies melena or hematochezia.  UA with microscopic hematuria.  She had TURBT about 2 months ago. ?-Transfuse 1 unit-verbally consented ?-Recheck H&H after transfusion ?-Monitor for signs of bleeding ?- ? ?Acute respiratory failure with hypoxia ([place]) ?Desaturated to 84% with ambulation on  room air.  Not on oxygen at home.  No respiratory symptoms.  No history of COPD, asthma and CHF.  CTA chest negative for PE or pneumonia.  Due to anemia?  She smokes cigarettes for most of her life until about a week ago.  She is at risk for COPD ?-Incentive spirometry, OOB ?-Trial of LAMA/LABA and as needed DuoNeb ?-Manage anemia as  above ? ?Cancer related pain ?Patient is on MS Contin and oxycodone.  Dose increased 2 days ago.  ?-Continue holding MS Contin ?-Continue oxycodone as needed ?-Bowel regimen ? ?UTI (urinary tract infection) ?Patient denies UTI symptoms but UA concerning.  She also have chills and shaking.  No fever or leukocytosis.  She had TURBT about 2 months ago. ?-Reasonable to treat for possible UTI pending urine culture ? 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 ?Closed fracture of rib on left side ?Noted on CTA chest.  Thought to be pathologic from metastatic lesion. ?-Supportive care ? ?Generalized weakness ?PT/OT ? ?HTN (hypertension) ?Hold home losartan and amlodipine in the setting of possible orthostatic hypotension ? ?Hypocalcemia ?Corrects to normal for hypoalbuminemia. ? ? ? ? ?  ? ?There is no height or weight on file to calculate BMI. ?  ?  ?  ?  ?DVT prophylaxis:  ?SCDs Start: 09/15/21 0857 ? ?Code Status: Full code ?Family Communication: Patient and/or RN. Available if any question.  ?Level of care: Telemetry ?Status is: Observation ?The patient will require care spanning > 2 midnights and should be moved to inpatient because: Acute respiratory failure with hypoxia, orthostatic hypotension, symptomatic anemia and possible urinary tract infection ? ? ?Final disposition: Home ? ?Consultants:  ?Urology ? ?Sch Meds:  ?Scheduled Meds: ? atorvastatin  10 mg Oral QPM  ? calcium carbonate  1 tablet Oral BID  ? cholecalciferol  2,000 Units Oral Daily  ? famotidine  40 mg Oral BID  ? omega-3 acid ethyl esters  1 g Oral Daily  ? pneumococcal 20-valent conjugate vaccine  0.5 mL Intramuscular Tomorrow-1000  ? umeclidinium-vilanterol  1 puff Inhalation Daily  ? ?Continuous Infusions: ? cefTRIAXone (ROCEPHIN)  IV 1 g (09/16/21 0603)  ? ?PRN Meds:.alum & mag hydroxide-simeth, ipratropium-albuterol, oxyCODONE-acetaminophen, polyethylene glycol,  prochlorperazine ? ?Antimicrobials: ?Anti-infectives (From admission, onward)  ? ? Start     Dose/Rate Route Frequency Ordered Stop  ? 09/15/21 0630  cefTRIAXone (ROCEPHIN) 1 g in sodium chloride 0.9 % 100 mL IVPB       ? 1 g ?200 mL/hr over 30 Minutes Intravenous Every 24 hours 09/15/21 0609    ? ?  ? ? ? ?I have personally reviewed the following labs and images: ?CBC: ?Recent Labs  ?Lab 09/15/21 ?0020 09/15/21 ?0115 09/15/21 ?1328 09/15/21 ?2008 09/16/21 ?0243  ?WBC 6.8  --   --   --  9.6  ?NEUTROABS 3.1  --   --   --   --   ?HGB 8.5* 7.8* 7.9* 7.9* 7.4*  7.5*  ?HCT 27.2* 23.0* 25.3* 25.5* 23.9*  24.2*  ?MCV 101.5*  --   --   --  101.7*  ?PLT 528*  --   --   --  605*  ? ?BMP &GFR ?Recent Labs  ?Lab 09/15/21 ?0020 09/15/21 ?0115 09/16/21 ?0243  ?NA 134* 134* 142  ?K 3.9 4.1 3.7  ?CL 101 100 109  ?CO2 20*  --  24  ?GLUCOSE 123* 116* 125*  ?BUN '17 15 15  '$ ?CREATININE 0.85 0.90 0.61  ?CALCIUM 7.2*  --  8.2*  ? ?CrCl cannot be calculated (Unknown ideal weight.). ?Liver & Pancreas: ?Recent Labs  ?Lab 09/15/21 ?0020 09/16/21 ?0243  ?AST 23 20  ?ALT 23 19  ?ALKPHOS 174* 161*  ?BILITOT 0.3 <0.1*  ?PROT 5.6* 5.6*  ?ALBUMIN 2.3* 2.0*  ? ?No results for input(s): LIPASE, AMYLASE in the last 168 hours. ?No results for input(s): AMMONIA in the last 168 hours. ?Diabetic: ?No results for input(s): HGBA1C in the last 72 hours. ?No results for input(s): GLUCAP in the last 168 hours. ?Cardiac Enzymes: ?No results for input(s): CKTOTAL, CKMB, CKMBINDEX, TROPONINI in the last 168 hours. ?No results for input(s): PROBNP in the last 8760 hours. ?Coagulation Profile: ?No results for input(s): INR, PROTIME in the last 168 hours. ?Thyroid Function Tests: ?No results for input(s): TSH, T4TOTAL, FREET4, T3FREE, THYROIDAB in the last 72 hours. ?Lipid Profile: ?No results for input(s): CHOL, HDL, LDLCALC, TRIG, CHOLHDL, LDLDIRECT in the last 72 hours. ?Anemia Panel: ?Recent Labs  ?  09/15/21 ?0946  ?TIBC 145*  ?IRON 14*  ? ?Urine analysis: ?    ?Component Value Date/Time  ? [place] (A) 09/15/2021 0513  ? APPEARANCEUR CLOUDY (A) 09/15/2021 0513  ? LABSPEC 1.044 (H) 09/15/2021 0513  ? PHURINE 5.0 09/15/2021 0513  ? [place] NEGATIVE 09/15/2021 0513  ? HGBUR MODERATE (A) 09/15/2021 0513  ?

## 2021-09-16 NOTE — Evaluation (Signed)
Physical Therapy Evaluation ?Patient Details ?Name: Cheryl Singh ?MRN: [id number] ?DOB: [birth year] ?Today's Date: 09/16/2021 ? ?History of Present Illness ? Pt admitted from home with SOB, tremors, near syncope and macrocytic anemia (7.8 hgb on admit - now 7.4).  Pt with hx of metastatic bladder CA  ?Clinical Impression ? Pt admitted as above and presenting with functional mobility limitations 2* generalized LE weakness, ongoing back pain, mild ambulatory balance deficits and decreased endurance.  This am, pt up to ambulate 200' in hall with RW on RA - pt reports mild SOB only - pulse ox indicates drop to 84% with HR elevated to 138 but with poor waveform.  RN aware.  Pt hopes to progress to dc home with intermittent assist of friends. ?   ? ?Recommendations for follow up therapy are one component of a multi-disciplinary discharge planning process, led by the attending physician.  Recommendations may be updated based on patient status, additional functional criteria and insurance authorization. ? ?Follow Up Recommendations No PT follow up ? ?  ?Assistance Recommended at Discharge Intermittent Supervision/Assistance  ?Patient can return home with the following ? Assistance with cooking/housework;Assist for transportation;Help with stairs or ramp for entrance ? ?  ?Equipment Recommendations None recommended by PT  ?Recommendations for Other Services ?    ?  ?Functional Status Assessment Patient has had a recent decline in their functional status and demonstrates the ability to make significant improvements in function in a reasonable and predictable amount of time.  ? ?  ?Precautions / Restrictions Precautions ?Precautions: Fall ?Precaution Comments: monitor HR and O2 ?Restrictions ?Weight Bearing Restrictions: No  ? ?  ? ?Mobility ? Bed Mobility ?Overal bed mobility: Modified Independent ?  ?  ?  ?  ?  ?  ?General bed mobility comments: no physical assist ?  ? ?Transfers ?Overall transfer level: Needs  assistance ?Equipment used: Rolling walker (2 wheels) ?Transfers: Sit to/from Stand ?Sit to Stand: Supervision ?  ?  ?  ?  ?  ?General transfer comment: for safety only ?  ? ?Ambulation/Gait ?Ambulation/Gait assistance: Min guard, Supervision ?Gait Distance (Feet): 200 Feet ?Assistive device: Rolling walker (2 wheels) ?Gait Pattern/deviations: Step-through pattern, Shuffle, Trunk flexed ?  ?  ?  ?General Gait Details: min cues for posture and position from RW ? ?Stairs ?  ?  ?  ?  ?  ? ?Wheelchair Mobility ?  ? ?Modified Rankin (Stroke Patients Only) ?  ? ?  ? ?Balance Overall balance assessment: Mild deficits observed, not formally tested ?  ?  ?  ?  ?  ?  ?  ?  ?  ?  ?  ?  ?  ?  ?  ?  ?  ?  ?   ? ? ? ?Pertinent Vitals/Pain Pain Assessment ?Pain Assessment: 0-10 ?Pain Score: 2  ?Pain Location: back ?Pain Descriptors / Indicators: Aching ?Pain Intervention(s): Limited activity within patient's tolerance  ? ? ?Home Living Family/patient expects to be discharged to:: Private residence ?Living Arrangements: Non-relatives/Friends ?Available Help at Discharge: Friend(s);Available PRN/intermittently ?Type of Home: House ?Home Access: Stairs to enter ?  ?Entrance Stairs-Number of Steps: threshold ?  ?  ?Home Equipment: Rolling Walker (2 wheels);Grab bars - tub/shower;Tub bench;Wheelchair - manual ?   ?  ?Prior Function Prior Level of Function : Independent/Modified Independent ?  ?  ?  ?  ?  ?  ?Mobility Comments: patient reported using wheelchair for longer distances in community. ?ADLs Comments: patient reported she was bathing at sink  while handicap shower was being built. ?  ? ? ?Hand Dominance  ? Dominant Hand: Left ? ?  ?Extremity/Trunk Assessment  ? Upper Extremity Assessment ?Upper Extremity Assessment: Overall WFL for tasks assessed ?  ? ?Lower Extremity Assessment ?Lower Extremity Assessment: Generalized weakness ?  ? ?Cervical / Trunk Assessment ?Cervical / Trunk Assessment: Normal  ?Communication  ?  Communication: No difficulties  ?Cognition Arousal/Alertness: Awake/alert ?Behavior During Therapy: [facility] for tasks assessed/performed ?Overall Cognitive Status: Within Functional Limits for tasks assessed ?  ?  ?  ?  ?  ?  ?  ?  ?  ?  ?  ?  ?  ?  ?  ?  ?  ?  ?  ? ?  ?General Comments   ? ?  ?Exercises    ? ?Assessment/Plan  ?  ?PT Assessment Patient needs continued PT services  ?PT Problem List Decreased strength;Decreased activity tolerance;Decreased balance;Decreased mobility;Decreased knowledge of use of DME;Pain ? ?   ?  ?PT Treatment Interventions DME instruction;Gait training;Stair training;Functional mobility training;Therapeutic activities;Therapeutic exercise   ? ?PT Goals (Current goals can be found in the Care Plan section)  ?Acute Rehab PT Goals ?Patient Stated Goal: REgain IND and return home ?PT Goal Formulation: With patient ?Time For Goal Achievement: 09/30/21 ?Potential to Achieve Goals: Good ? ?  ?Frequency Min 3X/week ?  ? ? ?Co-evaluation PT/OT/SLP Co-Evaluation/Treatment: Yes ?Reason for Co-Treatment: Complexity of the patient's impairments (multi-system involvement) ?PT goals addressed during session: Mobility/safety with mobility ?OT goals addressed during session: ADL's and self-care ?  ? ? ?  ?AM-PAC PT "6 Clicks" Mobility  ?Outcome Measure Help needed turning from your back to your side while in a flat bed without using bedrails?: None ?Help needed moving from lying on your back to sitting on the side of a flat bed without using bedrails?: None ?Help needed moving to and from a bed to a chair (including a wheelchair)?: A Little ?Help needed standing up from a chair using your arms (e.g., wheelchair or bedside chair)?: A Little ?Help needed to walk in hospital room?: A Little ?Help needed climbing 3-5 steps with a railing? : A Lot ?6 Click Score: 19 ? ?  ?End of Session Equipment Utilized During Treatment: Gait belt ?Activity Tolerance: Patient tolerated treatment well;Patient limited by  fatigue ?Patient left: in chair;with call bell/phone within reach;with chair alarm set ?Nurse Communication: Mobility status ?PT Visit Diagnosis: Difficulty in walking, not elsewhere classified (R26.2) ?  ? ?Time: 7322-0254 ?PT Time Calculation (min) (ACUTE ONLY): 24 min ? ? ?Charges:   PT Evaluation ?$PT Eval Low Complexity: 1 Low ?  ?  ?   ? ? ?Debe Coder PT ?Acute Rehabilitation Services ?Pager [phone] ?Office [phone] ? ? ?Cheryl Singh ?09/16/2021, 10:00 AM ? ?

## 2021-09-16 NOTE — TOC Initial Note (Signed)
Transition of Care (TOC) - Initial/Assessment Note  ? ? ?Patient Details  ?Name: Cheryl Singh ?MRN: [id number] ?Date of Birth: 05/12/1951 ? ?Transition of Care (TOC) CM/SW Contact:    ?Tawanna Cooler, RN ?Phone Number: ?09/16/2021, 2:41 PM ? ?Clinical Narrative:                 ? ?Lives with friend.  Monitor for potential home O2 need.  TOC following. ? ? ?Expected Discharge Plan: Home/Self Care ?Barriers to Discharge: Continued Medical Work up ? ? ?Patient Goals and CMS Choice ?Patient states their goals for this hospitalization and ongoing recovery are:: to go home ?  ?  ? ?Expected Discharge Plan and Services ?Expected Discharge Plan: Home/Self Care ?  ?  ?  ?Living arrangements for the past 2 months: [place] ?                ? Prior Living Arrangements/Services ?Living arrangements for the past 2 months: [place] ?Lives with:: Friends ?Patient language and need for interpreter reviewed:: Yes ?Do you feel safe going back to the place where you live?: Yes      ?Need for Family Participation in Patient Care: Yes (Comment) ?Care giver support system in place?: Yes (comment) ?  ?Criminal Activity/Legal Involvement Pertinent to Current Situation/Hospitalization: No - Comment as needed ? ?Activities of Daily Living ?Home Assistive Devices/Equipment: Gilford Rile (specify type) ?ADL Screening (condition at time of admission) ?Patient's cognitive ability adequate to safely complete daily activities?: Yes ?Is the patient deaf or have difficulty hearing?: No ?Does the patient have difficulty seeing, even when wearing glasses/contacts?: No ?Does the patient have difficulty concentrating, remembering, or making decisions?: No ?Patient able to express need for assistance with ADLs?: Yes ?Does the patient have difficulty dressing or bathing?: No ?Independently performs ADLs?: No ?Communication: Independent ?Dressing (OT): Independent ?Grooming: Independent ?Feeding: Independent ?Bathing:  Independent ?Toileting: Independent ?In/Out Bed: Needs assistance ?Is this a change from baseline?: Change from baseline, expected to last <3 days ?Walks in Home: Independent with device (comment) ?Does the patient have difficulty walking or climbing stairs?: Yes ?Weakness of Legs: Both ?Weakness of Arms/Hands: None ? ?  ?Orientation: : Oriented to Self, Oriented to Place, Oriented to  Time, Oriented to Situation ?Alcohol / Substance Use: Not Applicable ?Psych Involvement: No (comment) ? ?Admission diagnosis:  Orthostatic hypotension [I95.1] ?Near syncope [R55] ?Symptomatic anemia [D64.9] ?Patient Active Problem List  ? Diagnosis Date Noted  ? Acute respiratory failure with hypoxia ([place]) 09/16/2021  ? Generalized weakness 09/16/2021  ? Cancer related pain 09/16/2021  ? Closed fracture of rib on left side 09/16/2021  ? Orthostatic hypotension 09/15/2021  ? UTI (urinary tract infection) 09/15/2021  ? Symptomatic anemia 09/15/2021  ? Hypocalcemia 09/15/2021  ? HTN (hypertension) 09/15/2021  ? HLD (hyperlipidemia) 09/15/2021  ? GERD (gastroesophageal reflux disease) 09/15/2021  ? 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 Goals of care, counseling/discussion 08/16/2021  ? ?PCP:  Glenis Smoker, MD ?Pharmacy:   ?[id number] [place] [address]?Phone: [phone]: [phone]? ? ?Readmission Risk Interventions ?No flowsheet data found. ? ? ?

## 2021-09-16 NOTE — Assessment & Plan Note (Signed)
Noted on CTA chest.  Thought to be pathologic from metastatic lesion. ?-Supportive care ?

## 2021-09-16 NOTE — Assessment & Plan Note (Signed)
PT/OT

## 2021-09-16 NOTE — Assessment & Plan Note (Addendum)
Patient is on MS Contin and oxycodone.  Dose increased 2 days prior to presentation. ?-Oncology started OxyContin since she did not tolerate morphine. ?-Continue Percocet as needed ?-Bowel regimen ?

## 2021-09-16 NOTE — Progress Notes (Incomplete)
?Radiation Oncology         [phone] ?________________________________ ? ?Initial Inpatient Consultation ? ?Name: Cheryl Singh MRN: [id number]  ?Date: 09/17/2021  DOB: May 29, 1951 ? ?FA:[id number], Anastasia Pall, MD  Volanda Napoleon, MD  ? ?REFERRING PHYSICIAN: Volanda Napoleon, MD ? 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 ? ?Metastatic stage IV bladder cancer  ? ?  ICD-10-CM   ?1. 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 C79.51   ?  ?2. 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 C78.7   ?  ?3. 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 C77.5   ?  ? ? ?HISTORY OF PRESENT ILLNESS::Cheryl Singh is a 71 y.o. female who is accompanied by ***. she is seen as a courtesy of Dr. Marin Olp for an opinion concerning radiation therapy as part of management for her diagnosed metastatic bladder cancer.  ? ?The patient initially report instances of hematuria and recurrent UTI's on 06/15/21 to Lottie Dawson NP [facility]). The patient was being evaluated at the cancer center at the time for macrocytosis.  ? ?She was then referred to Dr. Milford Cage at [facility] for a cytoscopy on 07/17/21. Cytoscopy showed a mass emanating from the left side of the bladder. 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 The bladder mass was also resected at this time.  ? ?CT scan of the chest abdomen and pelvis performed on 08/02/21 unfortunately showed widespread metastatic disease, with multiple osseous metastatic lesions through out the spine, ribs, and sternum. No pathologic fractures were noted. 2 indeterminate lesions in the liver concerning for metastatic disease were also  appreciated. Bladder was also noted to have probable residual disease extending through into the perivesical fat.  ? ?Subsequently, the patient was referred to Dr. Marin Olp on 08/16/21 for further evaluation and management. During this visit, the patient denied any further hematuria and denied any bony pain. She did endorse mild fatigue but denied any other symptoms. Treatment options were discussed with the patient, and she consented to pursue systemic chemotherapy consisting of cisplatin and gemzar. She also agreed to start zometa and Xgeva for her bony disease.  ? ?PET on 08/31/21 showed: the large soft tissue mass involving the left bladder wall; a hypermetabolic lymph node in the posterior mediastinum with another hypermetabolic lymph node identified in the left groin; hypermetabolic liver metastases involving both hepatic lobes; and extensive hypermetabolic bony metastases involving the spine, ?sacrum, bony pelvis, both clavicles, bilateral ribs, and proximal right humerus. ? ?The patient was also referred to palliative medicine on 09/13/21. During which time, the patient reported ongoing left sided, sternal, and back pain. She reported that her pain to be most severe when she exerts herself, and difficulty sitting up in her recliner. Her pain was noted to be uncontrolled despite her current regimen of Percocet. The patient was advised on the importance of her consult with me here today, as radiation could also help aid her pain.  ? ?The patient recently presented to the ED on 09/14/20 with acute onset of chills, weakness, and SOB on exertion.  CXR performed was negative for any active disease. CTA of the chest showed a new likely pathologic left lateral  7th rib fracture, and her known multifocal sclerotic osseous metastases and hepatic metastases. No evidence of PE was appreciated. The patient currently remains admitted under observation.  ? ?PREVIOUS RADIATION THERAPY: No ? ?PAST MEDICAL HISTORY:  ?Past  Medical History:  ?Diagnosis Date  ? Arthritis   ? 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 Goals of care, counseling/discussion 08/16/2021  ? Hypertension   ? ? ?PAST SURGICAL HISTORY: ?Past Surgical History:  ?Procedure Laterality Date  ? HEART CHAMBER REVISION    ? IR IMAGING GUIDED PORT INSERTION  08/20/2021  ? TRANSURETHRAL RESECTION OF BLADDER TUMOR Bilateral 07/17/2021  ? Procedure: CYSTOSCOPY BILATERAL RETROGRADES TRANSURETHRAL RESECTION OF BLADDER TUMOR (TURBT);  Surgeon: Remi Haggard, MD;  Location: [facility];  Service: Urology;  Laterality: Bilateral;  ? ? ?FAMILY HISTORY: No family history on file. ? ?SOCIAL HISTORY:  ?Social History  ? ?Tobacco Use  ? Smoking status: Every Day  ?  Packs/day: 0.50  ?  Years: 40.00  ?  Pack years: 20.00  ?  Types: Cigarettes  ? Smokeless tobacco: Never  ?Vaping Use  ? Vaping Use: Never used  ?Substance Use Topics  ? Alcohol use: Yes  ?  Comment: occ  ? Drug use: No  ? ? ?ALLERGIES: No Known Allergies ? ?MEDICATIONS:  ?No current facility-administered medications for this encounter.  ? ?No current outpatient medications on file.  ? ?Facility-Administered Medications Ordered in Other Encounters  ?Medication Dose Route Frequency Provider Last Rate Last Admin  ? alum & mag hydroxide-simeth (MAALOX/MYLANTA) 200-200-20 MG/5ML suspension 30 mL  30 mL Oral Q4H PRN Marylyn Ishihara, Tyrone A, DO   30 mL at 09/16/21 1231  ? atorvastatin (LIPITOR) tablet 10 mg  10 mg Oral QPM Kyle, Tyrone A, DO   10 mg at 09/15/21 1738  ? calcium carbonate (TUMS - dosed in mg elemental calcium) chewable tablet 200 mg of elemental calcium  1 tablet Oral BID Marylyn Ishihara, Tyrone A, DO   200 mg of elemental calcium at 09/16/21 1007  ? cefTRIAXone (ROCEPHIN) 1 g in sodium chloride 0.9 % 100 mL IVPB  1 g Intravenous Q24H Kyle, Tyrone A, DO 200 mL/hr at 09/16/21 0603 1 g at 09/16/21  0603  ? cholecalciferol (VITAMIN D) tablet 2,000 Units  2,000 Units Oral Daily Kyle, Tyrone A, DO   2,000 Units at 09/16/21 1008  ? famotidine (PEPCID) tablet 40 mg  40 mg Oral BID Marylyn Ishihara, Tyrone A, DO   40 mg at 09/16/21 1007  ? ipratropium-albuterol (DUONEB) 0.5-2.5 (3) MG/3ML nebulizer solution 3 mL  3 mL Nebulization Q4H PRN Wendee Beavers T, MD      ? omega-3 acid ethyl esters (LOVAZA) capsule 1 g  1 g Oral Daily Kyle, Tyrone A, DO   1 g at 09/16/21 1008  ? oxyCODONE-acetaminophen (PERCOCET/ROXICET) 5-325 MG per tablet 1-2 tablet  1-2 tablet Oral Q4H PRN Cherylann Ratel A, DO   1 tablet at 09/16/21 1232  ? pneumococcal 20-valent conjugate vaccine (PREVNAR 20) injection 0.5 mL  0.5 mL Intramuscular Tomorrow-1000 Kyle, Tyrone A, DO      ? polyethylene glycol (MIRALAX / GLYCOLAX) packet 17 g  17 g Oral Daily PRN Marylyn Ishihara, Tyrone A, DO      ? prochlorperazine (COMPAZINE) tablet 10 mg  10 mg Oral Q6H PRN Marylyn Ishihara, Tyrone A, DO      ? ? ?REVIEW OF SYSTEMS:  A 10+ POINT REVIEW OF SYSTEMS WAS OBTAINED including neurology, dermatology, psychiatry, cardiac, respiratory, lymph, extremities, GI, GU, musculoskeletal, constitutional, reproductive, HEENT. *** ?  ?PHYSICAL EXAM:  vitals were not taken for this visit.   ?General: Alert and oriented, in no acute distress ?HEENT: Head is normocephalic. Extraocular movements are intact. Oropharynx is clear. ?Neck: Neck is supple, no palpable cervical or supraclavicular lymphadenopathy. ?Heart: Regular in rate and rhythm with no murmurs, rubs, or gallops. ?Chest: Clear to auscultation bilaterally, with no rhonchi, wheezes, or rales. ?Abdomen: Soft, nontender, nondistended, with no rigidity or guarding. ?Extremities: No cyanosis or edema. ?Lymphatics: see Neck Exam ?Skin: No concerning lesions. ?Musculoskeletal: symmetric strength and muscle tone throughout. ?Neurologic: Cranial nerves II through XII are grossly intact. No obvious focalities. Speech is fluent. Coordination is intact. ?Psychiatric:  Judgment and insight are intact. Affect is appropriate. ?*** ? ?ECOG = *** ? ?0 - Asymptomatic (Fully active, able to carry on all predisease activities without restriction) ? ?1 - Symptomatic but complete

## 2021-09-16 NOTE — Assessment & Plan Note (Addendum)
Recent Labs  ?  08/16/21 ?1104 08/28/21 ?0816 09/04/21 ?1102 09/15/21 ?0020 09/15/21 ?0115 09/15/21 ?1328 09/15/21 ?2008 09/16/21 ?0243 09/16/21 ?1911 09/18/21 ?0959  ?HGB 11.4* 10.2* 10.0* 8.5* 7.8* 7.9* 7.9* 7.4*  7.5* 9.4* 9.3*  ?Denies melena or hematochezia.  UA with microscopic hematuria.  She had TURBT about 2 months ago.  Transfused 1 unit with appropriate response.  Symptoms improved. ?-Monitor H&H ?

## 2021-09-16 NOTE — Hospital Course (Addendum)
71 year old F with PMH of stage IV bladder cancer s/p TURBT last month, anemia, HTN, HLD and GERD presenting with generalized weakness, chills, shaking, lightheadedness, shortness of breath and abdominal bloating/pain, and admitted for near syncope, orthostatic hypotension and possible UTI.  Patient had pain medication increased recently.  UA concerning for UTI but she denies UTI symptoms.  Urology consulted and recommended empiric antibiotics.  She was started on IV ceftriaxone and IV fluid and admitted. ? ?The next day, patient was ambulated and desaturated to 84% on room air.  She has no history of COPD, asthma or CHF but lifelong smoking history.  Started inhalers and nebulizers. ? ?Hemoglobin trended down to 7.4.  She has had TURBT two months ago.  Some microscopic hematuria on UA but no gross hematuria.  She denies melena or hematochezia.  H&H stable after transfusion. ? ?Patient was on IV ceftriaxone for possible UTI.  She has no UTI symptoms but UA concerning.  Urine cultures with multiple species x2.  Now with mild temp and rising leukocytosis.  Started IV meropenem.  Recheck UA and urine culture from catheterized sample. ? ?Oncology and radiation oncology following. ? ?

## 2021-09-16 NOTE — Evaluation (Signed)
Occupational Therapy Evaluation ?Patient Details ?Name: Cheryl Singh ?MRN: [id number] ?DOB: 03-08-1951 ?Today's Date: 09/16/2021 ? ? ?History of Present Illness Pt admitted from home with SOB, tremors, near syncope and macrocytic anemia (7.8 hgb on admit - now 7.4).  Pt with hx of metastatic bladder CA  ? ?Clinical Impression ?   ?Patient is a 71 year old female who was admitted for above. Patient was noted to have increased HR of 137 bpm with functional mobility with RW with increased fatigue. Patient was noted to have increased pain, decreased functional activity tolerance, increased need for supplemental O2, decreased knowledge of AE/AD, decreased safety awareness impacting participation in ADLs. Patient would continue to benefit from skilled OT services at this time while admitted to address noted deficits in order to improve overall safety and independence in ADLs.  ?  ?   ? ?Recommendations for follow up therapy are one component of a multi-disciplinary discharge planning process, led by the attending physician.  Recommendations may be updated based on patient status, additional functional criteria and insurance authorization.  ? ?Follow Up Recommendations ? No OT follow up  ?  ?Assistance Recommended at Discharge Frequent or constant Supervision/Assistance  ?Patient can return home with the following A little help with walking and/or transfers;A little help with bathing/dressing/bathroom;Assistance with cooking/housework;Direct supervision/assist for financial management;Assist for transportation;Help with stairs or ramp for entrance;Direct supervision/assist for medications management ? ?  ?Functional Status Assessment ? Patient has had a recent decline in their functional status and demonstrates the ability to make significant improvements in function in a reasonable and predictable amount of time.  ?Equipment Recommendations ?    ?  ?Recommendations for Other Services   ? ? ?  ?Precautions / Restrictions  Precautions ?Precautions: Fall ?Precaution Comments: monitor HR and O2 ?Restrictions ?Weight Bearing Restrictions: No  ? ?  ? ?Mobility Bed Mobility ?Overal bed mobility: Modified Independent ?  ?  ?  ?  ?  ?  ?General bed mobility comments: no physical assist ?  ? ?Transfers ?  ?  ?  ?  ?  ?  ?  ?  ?  ?  ?  ? ?  ?Balance Overall balance assessment: Mild deficits observed, not formally tested ?  ?  ?  ?  ?  ?  ?  ?  ?  ?  ?  ?  ?  ?  ?  ?  ?  ?  ?   ? ?ADL either performed or assessed with clinical judgement  ? ?ADL Overall ADL's : Needs assistance/impaired ?Eating/Feeding: Modified independent;Sitting ?  ?Grooming: Dance movement psychotherapist;Set up;Sitting ?  ?Upper Body Bathing: Set up;Sitting ?  ?Lower Body Bathing: Minimal assistance;Sit to/from stand;Sitting/lateral leans ?  ?Upper Body Dressing : Set up;Sitting ?  ?Lower Body Dressing: Min guard;Set up;Sit to/from stand;Sitting/lateral leans;Minimal assistance ?  ?Toilet Transfer: Min guard;Rolling walker (2 wheels) ?Toilet Transfer Details (indicate cue type and reason): patient was able to transfer from edge of bed to recliner in room after functional mobility in hallway with min guard with cues for proper hand placement ?Toileting- Clothing Manipulation and Hygiene: Minimal assistance;Sit to/from stand ?  ?  ?  ?Functional mobility during ADLs: Min guard;Rolling walker (2 wheels) ?General ADL Comments: patients HR was noted to increase to 137bpm with functional mobility with patient reporting fatigue.  ? ? ? ?Vision Baseline Vision/History: 1 Wears glasses ?Patient Visual Report: No change from baseline ?   ?   ?Perception   ?  ?Praxis   ?  ? ?  Pertinent Vitals/Pain Pain Assessment ?Pain Assessment: 0-10 ?Pain Score: 2  ?Pain Location: back ?Pain Descriptors / Indicators: Aching ?Pain Intervention(s): Premedicated before session, Monitored during session, Limited activity within patient's tolerance  ? ? ? ?Hand Dominance Left ?  ?Extremity/Trunk Assessment Upper  Extremity Assessment ?Upper Extremity Assessment: Overall WFL for tasks assessed ?  ?Lower Extremity Assessment ?Lower Extremity Assessment: Generalized weakness ?  ?Cervical / Trunk Assessment ?Cervical / Trunk Assessment: Normal ?  ?Communication Communication ?Communication: No difficulties ?  ?Cognition Arousal/Alertness: Awake/alert ?Behavior During Therapy: [facility] for tasks assessed/performed ?Overall Cognitive Status: Within Functional Limits for tasks assessed ?  ?  ?  ?  ?  ?  ?  ?  ?  ?  ?  ?  ?  ?  ?  ?  ?  ?  ?  ?General Comments    ? ?  ?Exercises   ?  ?Shoulder Instructions    ? ? ?Home Living Family/patient expects to be discharged to:: Private residence ?Living Arrangements: Non-relatives/Friends ?Available Help at Discharge: Friend(s);Available PRN/intermittently ?Type of Home: House ?Home Access: Stairs to enter ?Entrance Stairs-Number of Steps: threshold ?  ?  ?  ?  ?Bathroom Shower/Tub: Walk-in shower ?  ?  ?  ?  ?Home Equipment: Rolling Walker (2 wheels);Grab bars - tub/shower;Tub bench;Wheelchair - manual ?  ?  ?  ? ?  ?Prior Functioning/Environment Prior Level of Function : Independent/Modified Independent ?  ?  ?  ?  ?  ?  ?Mobility Comments: patient reported using wheelchair for longer distances in community. ?ADLs Comments: patient reported she was bathing at sink while handicap shower was being built. ?  ? ?  ?  ?OT Problem List: Decreased activity tolerance;Impaired balance (sitting and/or standing);Decreased safety awareness;Cardiopulmonary status limiting activity;Decreased knowledge of precautions;Decreased knowledge of use of DME or AE ?  ?   ?OT Treatment/Interventions: Self-care/ADL training;Therapeutic exercise;Neuromuscular education;Energy conservation;DME and/or AE instruction;Therapeutic activities;Balance training;Patient/family education  ?  ?OT Goals(Current goals can be found in the care plan section) Acute Rehab OT Goals ?Patient Stated Goal: to get moving ?OT Goal  Formulation: With patient ?Time For Goal Achievement: 09/30/21 ?Potential to Achieve Goals: Good  ?OT Frequency: Min 2X/week ?  ? ?Co-evaluation PT/OT/SLP Co-Evaluation/Treatment: Yes ?Reason for Co-Treatment: Complexity of the patient's impairments (multi-system involvement) ?PT goals addressed during session: Mobility/safety with mobility ?OT goals addressed during session: ADL's and self-care ?  ? ?  ?AM-PAC OT "6 Clicks" Daily Activity     ?Outcome Measure Help from another person eating meals?: None ?Help from another person taking care of personal grooming?: A Little ?Help from another person toileting, which includes using toliet, bedpan, or urinal?: A Little ?Help from another person bathing (including washing, rinsing, drying)?: A Little ?Help from another person to put on and taking off regular upper body clothing?: A Little ?Help from another person to put on and taking off regular lower body clothing?: A Little ?6 Click Score: 19 ?  ?End of Session Equipment Utilized During Treatment: Gait belt;Rolling walker (2 wheels) ?Nurse Communication: Mobility status ? ?Activity Tolerance: Patient tolerated treatment well ?Patient left: in chair;with call bell/phone within reach;with chair alarm set ? ?OT Visit Diagnosis: Unsteadiness on feet (R26.81);Muscle weakness (generalized) (M62.81)  ?              ?Time: 4656-8127 ?OT Time Calculation (min): 25 min ?Charges:  OT General Charges ?$OT Visit: 1 Visit ?OT Evaluation ?$OT Eval Low Complexity: 1 Low ? ?Demaryius Imran OTR/L, MS ?Acute  Rehabilitation Department ?Office# [phone] ?Pager# [phone] ? ? ?[place] ?09/16/2021, 11:16 AM ?

## 2021-09-16 NOTE — Progress Notes (Signed)
Dr. Cyndia Skeeters informed of Patient's rash on upper chest. Cheryl Singh states she doesn't know when she got this rash and it doesn't itch. ?

## 2021-09-16 NOTE — Assessment & Plan Note (Addendum)
Initial orthostatic vitals positive from lying to sitting.  Repeat orthostatic vitals negative.  This is likely due to opiate medication and symptomatic anemia. ?-Transfused 1 unit with appropriate response for anemia ?-Stopped morphine.  Tolerating oxycodone. ?-Fall precaution ?-PT/OT ?

## 2021-09-16 NOTE — Assessment & Plan Note (Addendum)
Patient denies UTI symptoms but UA concerning.  She had TURBT about 2 months ago.  Urine culture with multiple species x2.  History of ESBL.  Now with rising leukocytosis and mild pain.. ?-Change IV ceftriaxone to IV meropenem ?-Check urinalysis and urine culture from catheterized sample. ?

## 2021-09-17 ENCOUNTER — Ambulatory Visit: Payer: Medicare PPO

## 2021-09-17 ENCOUNTER — Ambulatory Visit
Admission: RE | Admit: 2021-09-17 | Discharge: 2021-09-17 | Disposition: A | Discharge status: Home / Self Care | Payer: Medicare PPO | Source: Ambulatory Visit | Attending: Radiation Oncology | Admitting: Radiation Oncology

## 2021-09-17 ENCOUNTER — Ambulatory Visit
Admit: 2021-09-17 | Discharge: 2021-09-17 | Disposition: A | Discharge status: Home / Self Care | Payer: Medicare PPO | Attending: Radiation Oncology | Admitting: Radiation Oncology

## 2021-09-17 ENCOUNTER — Ambulatory Visit: Payer: Medicare PPO | Admitting: Radiation Oncology

## 2021-09-17 DIAGNOSIS — C7951 Secondary malignant neoplasm of bone: Secondary | ICD-10-CM

## 2021-09-17 DIAGNOSIS — I951 Orthostatic hypotension: Secondary | ICD-10-CM | POA: Diagnosis not present

## 2021-09-17 DIAGNOSIS — J9601 Acute respiratory failure with hypoxia: Secondary | ICD-10-CM | POA: Diagnosis not present

## 2021-09-17 DIAGNOSIS — C679 Malignant neoplasm of bladder, unspecified: Secondary | ICD-10-CM

## 2021-09-17 DIAGNOSIS — D649 Anemia, unspecified: Secondary | ICD-10-CM | POA: Diagnosis not present

## 2021-09-17 LAB — TYPE AND SCREEN
ABO/RH(D): AB POS
Antibody Screen: NEGATIVE
Unit division: 0

## 2021-09-17 LAB — BPAM RBC
Blood Product Expiration Date: 202304092359
ISSUE DATE / TIME: 202303191403
Unit Type and Rh: 6200

## 2021-09-17 MED ORDER — AMLODIPINE BESYLATE 10 MG PO TABS
10.0000 mg | ORAL_TABLET | Freq: Every day | ORAL | Status: DC
  Administered 2021-09-17 – 2021-09-19 (×3): 10 mg via ORAL
  Filled 2021-09-17 (×3): qty 1

## 2021-09-17 MED ORDER — CHLORHEXIDINE GLUCONATE CLOTH 2 % EX PADS
6.0000 | MEDICATED_PAD | Freq: Every day | CUTANEOUS | Status: DC
  Administered 2021-09-17 – 2021-09-21 (×5): 6 via TOPICAL

## 2021-09-17 MED ORDER — OXYCODONE HCL ER 10 MG PO T12A
10.0000 mg | EXTENDED_RELEASE_TABLET | Freq: Two times a day (BID) | ORAL | Status: DC
  Administered 2021-09-17 – 2021-09-22 (×10): 10 mg via ORAL
  Filled 2021-09-17 (×11): qty 1

## 2021-09-17 MED ORDER — SODIUM CHLORIDE 0.9 % IV SOLN
510.0000 mg | Freq: Once | INTRAVENOUS | Status: AC
  Administered 2021-09-17: 510 mg via INTRAVENOUS
  Filled 2021-09-17 (×2): qty 17

## 2021-09-17 MED ORDER — PNEUMOCOCCAL 20-VAL CONJ VACC 0.5 ML IM SUSY
0.5000 mL | PREFILLED_SYRINGE | INTRAMUSCULAR | Status: DC | PRN
  Filled 2021-09-17: qty 0.5

## 2021-09-17 MED ORDER — ENOXAPARIN SODIUM 40 MG/0.4ML IJ SOSY
40.0000 mg | PREFILLED_SYRINGE | Freq: Every day | INTRAMUSCULAR | Status: DC
  Administered 2021-09-17 – 2021-09-21 (×5): 40 mg via SUBCUTANEOUS
  Filled 2021-09-17 (×6): qty 0.4

## 2021-09-17 MED ORDER — ZOLEDRONIC ACID 4 MG/5ML IV CONC
4.0000 mg | Freq: Once | INTRAVENOUS | Status: AC
  Administered 2021-09-17: 4 mg via INTRAVENOUS
  Filled 2021-09-17: qty 5

## 2021-09-17 NOTE — Progress Notes (Signed)
?PROGRESS NOTE ? ?Cheryl Singh TZG:[id number] DOB: May 11, 1951  ? ?PCP: Glenis Smoker, MD ? ?Patient is from: Home.  Lives with friend. ? ?DOA: 09/14/2021 LOS: 1 ? ?Chief complaints ?Chief Complaint  ?Patient presents with  ? Near Syncope  ?  ? ?Brief Narrative / Interim history: ?71 year old F with PMH of stage IV bladder cancer s/p TURBT last month, anemia, HTN, HLD and GERD presenting with generalized weakness, chills, shaking, lightheadedness, shortness of breath and abdominal bloating/pain, and admitted for near syncope, orthostatic hypotension and possible UTI.  Patient had pain medication increased recently.  UA concerning for UTI but she denies UTI symptoms.  Urology consulted and recommended empiric antibiotics.  She was started on IV ceftriaxone and IV fluid and admitted. ? ?The next day, patient was ambulated and desaturated to 84% on room air.  She has no history of COPD, asthma or CHF but lifelong smoking history.  Hemoglobin trended down to 7.4.  She has had TURBT two months ago.  Some microscopic hematuria on UA but no gross hematuria.  She denies melena or hematochezia.  Transfused 1 unit with appropriate response.  Remains on IV ceftriaxone pending repeat urine culture. ? ?Oncology and radiation oncology following. ?  ? ?Subjective: ?Seen and examined earlier this morning.  No major events overnight of this morning.  No complaints.  Reportedly desaturated to 87% on room air when she walked to the bathroom this morning.  She did not feel short of breath.  Pain fairly controlled. ? ?Objective: ?Vitals:  ? 09/16/21 1939 09/17/21 0405 09/17/21 1129 09/17/21 1300  ?BP: (!) 157/88 (!) 166/88 (!) 145/83 (!) 149/87  ?Pulse: (!) 103 97 93 (!) 101  ?Resp: '16 18 18 16  '$ ?Temp: 97.9 ?F (36.6 ?C) 98.2 ?F (36.8 ?C) 98 ?F (36.7 ?C) 97.7 ?F (36.5 ?C)  ?TempSrc: Oral Oral Oral Oral  ?SpO2: 100% 100% 94% 92%  ? ? ?Examination: ? ?GENERAL: No apparent distress.  Nontoxic. ?HEENT: MMM.  Vision and hearing  grossly intact.  ?NECK: Supple.  No apparent JVD.  ?RESP:  No IWOB.  Fair aeration bilaterally. ?CVS:  RRR. Heart sounds normal.  ?ABD/GI/GU: BS+. Abd soft, NTND.  ?MSK/EXT:  Moves extremities. No apparent deformity. No edema.  ?SKIN: no apparent skin lesion or wound ?NEURO: Awake and alert. Oriented appropriately.  No apparent focal neuro deficit. ?PSYCH: Calm. Normal affect.  ? ?Procedures:  ?None ? ?Microbiology summarized: ?COVID-19 and influenza PCR nonreactive. ?Blood cultures NGTD. ?Initial urine culture with multiple species. ?Repeat urine culture pending. ? ?Assessment and Plan: ?* Orthostatic hypotension ?Initial orthostatic vitals positive from lying to sitting.  Repeat orthostatic vitals negative.  This is likely due to opiate medication and symptomatic anemia. ?-Transfused 1 unit with appropriate response for anemia ?-Stopped morphine.  Tolerating oxycodone. ?-Fall precaution ?-PT/OT ? ?Symptomatic anemia ?Recent Labs  ?  07/13/21 ?1000 08/16/21 ?1104 08/28/21 ?0816 09/04/21 ?1102 09/15/21 ?0020 09/15/21 ?0115 09/15/21 ?1328 09/15/21 ?2008 09/16/21 ?0243 09/16/21 ?1911  ?HGB 12.2 11.4* 10.2* 10.0* 8.5* 7.8* 7.9* 7.9* 7.4*  7.5* 9.4*  ?Denies melena or hematochezia.  UA with microscopic hematuria.  She had TURBT about 2 months ago.  Transfused 1 unit with appropriate response.  Symptoms improved. ?-Oncology ordered IV iron. ?-Monitor H&H ? ?Acute respiratory failure with hypoxia ([place]) ?Desaturated to 84% with ambulation on room air.  Not on oxygen at home.  No respiratory symptoms.  No history of COPD, asthma and CHF but lifelong smoker.  CTA chest negative for PE or pneumonia. ?-  Continue LAMA/LABA and DuoNeb as needed ?-Incentive spirometry, OOB ?-Wean oxygen as able. ?-Manage anemia as above ? ?Cancer related pain ?Patient is on MS Contin and oxycodone.  Dose increased 2 days prior to presentation. ?-Oncology started OxyContin since she did not tolerate morphine. ?-Continue Percocet as needed ?-Bowel  regimen ? ?UTI (urinary tract infection) ?Patient denies UTI symptoms but UA concerning.  She had TURBT about 2 months ago.  Initial urine culture with multiple species. ?-Continue IV ceftriaxone pending repeat urine culture ? 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 ?-Starting radiation treatment? ? ?Closed fracture of rib on left side ?Noted on CTA chest.  Thought to be pathologic from metastatic lesion. ?-Supportive care ? ?Generalized weakness ?PT/OT ? ?HTN (hypertension) ?Blood pressure slightly elevated. ?-Increase home amlodipine ?-Continue home losartan 100 mg daily ?-Add hydralazine as needed with parameters. ? ?Hypocalcemia ?Corrects to normal for hypoalbuminemia. ? ? ? ? ?  ? ?There is no height or weight on file to calculate BMI. ?  ?  ?  ?  ?DVT prophylaxis:  ?enoxaparin (LOVENOX) injection 40 mg Start: 09/17/21 1000 ?SCDs Start: 09/15/21 0857 ? ?Code Status: Full code ?Family Communication: Patient and/or RN. Available if any question.  ?Level of care: Med-Surg ?Status is: Inpatient ?The patient will remain inpatient because: Acute respiratory failure with hypoxia, symptomatic anemia and possible urinary tract infection ? ? ?Final disposition: Home ? ?Consultants:  ?Urology ?Oncology ?Radiation oncology ? ?Sch Meds:  ?Scheduled Meds: ? amLODipine  10 mg Oral Daily  ? atorvastatin  10 mg Oral QPM  ? calcium carbonate  1 tablet Oral BID  ? Chlorhexidine Gluconate Cloth  6 each Topical Daily  ? cholecalciferol  2,000 Units Oral Daily  ? enoxaparin (LOVENOX) injection  40 mg Subcutaneous Daily  ? famotidine  40 mg Oral BID  ? losartan  100 mg Oral Q0600  ? omega-3 acid ethyl esters  1 g Oral Daily  ? oxyCODONE  10 mg Oral Q12H  ? ?Continuous Infusions: ? cefTRIAXone (ROCEPHIN)  IV Stopped (09/17/21 0600)  ? ?PRN Meds:.alum & mag hydroxide-simeth, hydrALAZINE, ipratropium-albuterol, oxyCODONE-acetaminophen, pneumococcal 20-valent conjugate vaccine, polyethylene  glycol, prochlorperazine ? ?Antimicrobials: ?Anti-infectives (From admission, onward)  ? ? Start     Dose/Rate Route Frequency Ordered Stop  ? 09/15/21 0630  cefTRIAXone (ROCEPHIN) 1 g in sodium chloride 0.9 % 100 mL IVPB       ? 1 g ?200 mL/hr over 30 Minutes Intravenous Every 24 hours 09/15/21 0609    ? ?  ? ? ? ?I have personally reviewed the following labs and images: ?CBC: ?Recent Labs  ?Lab 09/15/21 ?0020 09/15/21 ?0115 09/15/21 ?1328 09/15/21 ?2008 09/16/21 ?0243 09/16/21 ?1911  ?WBC 6.8  --   --   --  9.6  --   ?NEUTROABS 3.1  --   --   --   --   --   ?HGB 8.5* 7.8* 7.9* 7.9* 7.4*  7.5* 9.4*  ?HCT 27.2* 23.0* 25.3* 25.5* 23.9*  24.2* 30.1*  ?MCV 101.5*  --   --   --  101.7*  --   ?PLT 528*  --   --   --  605*  --   ? ?BMP &GFR ?Recent Labs  ?Lab 09/15/21 ?0020 09/15/21 ?0115 09/16/21 ?0243  ?NA 134* 134* 142  ?K 3.9 4.1 3.7  ?CL 101 100 109  ?CO2 20*  --  24  ?GLUCOSE 123* 116* 125*  ?BUN '17 15 15  '$ ?CREATININE 0.85 0.90 0.61  ?  CALCIUM 7.2*  --  8.2*  ? ?CrCl cannot be calculated (Unknown ideal weight.). ?Liver & Pancreas: ?Recent Labs  ?Lab 09/15/21 ?0020 09/16/21 ?0243  ?AST 23 20  ?ALT 23 19  ?ALKPHOS 174* 161*  ?BILITOT 0.3 <0.1*  ?PROT 5.6* 5.6*  ?ALBUMIN 2.3* 2.0*  ? ?No results for input(s): LIPASE, AMYLASE in the last 168 hours. ?No results for input(s): AMMONIA in the last 168 hours. ?Diabetic: ?No results for input(s): HGBA1C in the last 72 hours. ?No results for input(s): GLUCAP in the last 168 hours. ?Cardiac Enzymes: ?No results for input(s): CKTOTAL, CKMB, CKMBINDEX, TROPONINI in the last 168 hours. ?No results for input(s): PROBNP in the last 8760 hours. ?Coagulation Profile: ?No results for input(s): INR, PROTIME in the last 168 hours. ?Thyroid Function Tests: ?No results for input(s): TSH, T4TOTAL, FREET4, T3FREE, THYROIDAB in the last 72 hours. ?Lipid Profile: ?No results for input(s): CHOL, HDL, LDLCALC, TRIG, CHOLHDL, LDLDIRECT in the last 72 hours. ?Anemia Panel: ?Recent Labs  ?   09/15/21 ?0946  ?TIBC 145*  ?IRON 14*  ? ?Urine analysis: ?   ?Component Value Date/Time  ? [place] (A) 09/15/2021 0513  ? APPEARANCEUR CLOUDY (A) 09/15/2021 0513  ? LABSPEC 1.044 (H) 09/15/2021 0513  ? P

## 2021-09-17 NOTE — Progress Notes (Signed)
I guess the right answer for this is that glucose levels are always drawn by radiology prior to a PET scan to make sure that the PET scan can be done. ? ?I will pray about this. ? ?Cheryl Singh ?

## 2021-09-17 NOTE — Progress Notes (Signed)
Physical Therapy Treatment ?Patient Details ?Name: Cheryl Singh ?MRN: [id number] ?DOB: September 27, 1950 ?Today's Date: 09/17/2021 ? ? ?History of Present Illness Pt admitted from home with SOB, tremors, near syncope and macrocytic anemia (7.8 hgb on admit - now 7.4).  Pt with hx of metastatic bladder CA ? ?  ?PT Comments  ? ? Pt ambulated 180' with RW, no loss of balance, SaO2 93% on room air with ambulation. She is progressing well with mobility.   ?Recommendations for follow up therapy are one component of a multi-disciplinary discharge planning process, led by the attending physician.  Recommendations may be updated based on patient status, additional functional criteria and insurance authorization. ? ?Follow Up Recommendations ? No PT follow up ?  ?  ?Assistance Recommended at Discharge Intermittent Supervision/Assistance  ?Patient can return home with the following Assistance with cooking/housework;Assist for transportation;Help with stairs or ramp for entrance;A little help with bathing/dressing/bathroom ?  ?Equipment Recommendations ? None recommended by PT  ?  ?Recommendations for Other Services   ? ? ?  ?Precautions / Restrictions Precautions ?Precautions: Fall ?Precaution Comments: monitor HR and O2 ?Restrictions ?Weight Bearing Restrictions: No  ?  ? ?Mobility ? Bed Mobility ?Overal bed mobility: Modified Independent ?  ?  ?  ?  ?  ?  ?General bed mobility comments: no physical assist, HOB up, used rail ?  ? ?Transfers ?Overall transfer level: Needs assistance ?Equipment used: Rolling walker (2 wheels) ?Transfers: Sit to/from Stand ?Sit to Stand: Supervision ?  ?  ?  ?  ?  ?General transfer comment: for safety only ?  ? ?Ambulation/Gait ?Ambulation/Gait assistance: Supervision ?Gait Distance (Feet): 180 Feet ?Assistive device: Rolling walker (2 wheels) ?Gait Pattern/deviations: Step-through pattern, Shuffle, Trunk flexed ?Gait velocity: WFL ?  ?  ?General Gait Details: steady, no loss of balance, SaO2 93% on  room air walking ? ? ?Stairs ?  ?  ?  ?  ?  ? ? ?Wheelchair Mobility ?  ? ?Modified Rankin (Stroke Patients Only) ?  ? ? ?  ?Balance Overall balance assessment: Needs assistance ?  ?Sitting balance-Leahy Scale: Good ?  ?  ?  ?Standing balance-Leahy Scale: Fair ?Standing balance comment: relies on BUE support for dynamic standing ?  ?  ?  ?  ?  ?  ?  ?  ?  ?  ?  ?  ? ?  ?Cognition Arousal/Alertness: Awake/alert ?Behavior During Therapy: [facility] for tasks assessed/performed ?Overall Cognitive Status: Within Functional Limits for tasks assessed ?  ?  ?  ?  ?  ?  ?  ?  ?  ?  ?  ?  ?  ?  ?  ?  ?  ?  ?  ? ?  ?Exercises   ? ?  ?General Comments   ?  ?  ? ?Pertinent Vitals/Pain Pain Assessment ?Faces Pain Scale: Hurts little more ?Pain Location: back ?Pain Descriptors / Indicators: Aching ?Pain Intervention(s): Limited activity within patient's tolerance, Monitored during session, Premedicated before session  ? ? ?Home Living   ?  ?  ?  ?  ?  ?  ?  ?  ?  ?   ?  ?Prior Function    ?  ?  ?   ? ?PT Goals (current goals can now be found in the care plan section) Acute Rehab PT Goals ?Patient Stated Goal: REgain IND and return home ?PT Goal Formulation: With patient ?Time For Goal Achievement: 09/30/21 ?Potential to Achieve Goals: Good ?Progress towards PT  goals: Progressing toward goals ? ?  ?Frequency ? ? ? Min 3X/week ? ? ? ?  ?PT Plan Current plan remains appropriate  ? ? ?Co-evaluation   ?  ?  ?  ?  ? ?  ?AM-PAC PT "6 Clicks" Mobility   ?Outcome Measure ? Help needed turning from your back to your side while in a flat bed without using bedrails?: None ?Help needed moving from lying on your back to sitting on the side of a flat bed without using bedrails?: None ?Help needed moving to and from a bed to a chair (including a wheelchair)?: A Little ?Help needed standing up from a chair using your arms (e.g., wheelchair or bedside chair)?: A Little ?Help needed to walk in hospital room?: A Little ?Help needed climbing 3-5 steps with  a railing? : A Little ?6 Click Score: 20 ? ?  ?End of Session Equipment Utilized During Treatment: Gait belt ?Activity Tolerance: Patient tolerated treatment well ?Patient left: with call bell/phone within reach;in bed ?Nurse Communication: Mobility status ?PT Visit Diagnosis: Difficulty in walking, not elsewhere classified (R26.2) ?  ? ? ?Time: 8101-7510 ?PT Time Calculation (min) (ACUTE ONLY): 21 min ? ?Charges:  $Gait Training: 8-22 mins          ?          ? ?Philomena Doheny PT 09/17/2021  ?Acute Rehabilitation Services ?Pager [phone] ?Office [phone] ? ? ?

## 2021-09-17 NOTE — Progress Notes (Signed)
Occupational Therapy Treatment ?Patient Details ?Name: Cheryl Singh ?MRN: [id number] ?DOB: [birth year] ?Today's Date: 09/17/2021 ? ? ?History of present illness Pt admitted from home with SOB, tremors, near syncope and macrocytic anemia (7.8 hgb on admit - now 7.4).  Pt with hx of metastatic bladder CA ?  ?OT comments ? Patient was motivated to get up and move even with reported fatigue and pain in back after radiation session. patients HR was noted to reach 126 bpm in hallway during requested functional mobility. patient was noted to have O2 drop to 85% on RA breifly with patient able to deep breath up to 92% on RA within 45 seconds with cues for deep breathing.  Patient was noted to have improvement in mobility. OT to reassess patients need for acute OT services during next session. OT to continue to follow.   ? ?Recommendations for follow up therapy are one component of a multi-disciplinary discharge planning process, led by the attending physician.  Recommendations may be updated based on patient status, additional functional criteria and insurance authorization. ?   ?Follow Up Recommendations ? No OT follow up  ?  ?Assistance Recommended at Discharge Frequent or constant Supervision/Assistance  ?Patient can return home with the following ? A little help with walking and/or transfers;A little help with bathing/dressing/bathroom;Assistance with cooking/housework;Direct supervision/assist for financial management;Assist for transportation;Help with stairs or ramp for entrance;Direct supervision/assist for medications management ?  ?Equipment Recommendations ?    ?  ?Recommendations for Other Services   ? ?  ?Precautions / Restrictions Precautions ?Precautions: Fall ?Precaution Comments: monitor HR and O2 ?Restrictions ?Weight Bearing Restrictions: No  ? ? ?  ? ?Mobility Bed Mobility ?Overal bed mobility: Modified Independent ?  ?  ?  ?  ?  ?  ?  ?  ? ?Transfers ?  ?  ?  ?  ?  ?  ?  ?  ?  ?  ?  ?  ?Balance Overall  balance assessment: No apparent balance deficits (not formally assessed) ?  ?  ?  ?  ?  ?  ?  ?  ?  ?  ?  ?  ?  ?  ?  ?  ?  ?  ?   ? ?ADL either performed or assessed with clinical judgement  ? ?ADL Overall ADL's : Needs assistance/impaired ?  ?  ?  ?  ?  ?  ?  ?  ?  ?  ?  ?  ?Toilet Transfer: Supervision/safety;Ambulation;Rolling walker (2 wheels) ?Toilet Transfer Details (indicate cue type and reason): with distant SUP with no LOB. education on deep breathing to maintain o2 saturation. ?Toileting- Clothing Manipulation and Hygiene: Supervision/safety;Sit to/from stand ?  ?  ?  ?Functional mobility during ADLs: Min guard;Rolling walker (2 wheels) ?General ADL Comments: patients HR was noted to reach 126 bpm in hallway during requested functional mobility. patient was noted to have O2 drop to 85% on RA breifly with patient able to deep breath up to 92% on RA within 45 seconds with cues for deep breathing. ?  ? ?Extremity/Trunk Assessment   ?  ?  ?  ?  ?  ? ?Vision   ?  ?  ?Perception   ?  ?Praxis   ?  ? ?Cognition Arousal/Alertness: Awake/alert ?Behavior During Therapy: [facility] for tasks assessed/performed ?Overall Cognitive Status: Within Functional Limits for tasks assessed ?  ?  ?  ?  ?  ?  ?  ?  ?  ?  ?  ?  ?  ?  ?  ?  ?  General Comments: motivated to participate in all movement ?  ?  ?   ?Exercises   ? ?  ?Shoulder Instructions   ? ? ?  ?General Comments    ? ? ?Pertinent Vitals/ Pain       Pain Assessment ?Pain Assessment: Faces ?Faces Pain Scale: Hurts little more ?Pain Location: back ?Pain Descriptors / Indicators: Aching ?Pain Intervention(s): Limited activity within patient's tolerance, Monitored during session, Repositioned ? ?Home Living   ?  ?  ?  ?  ?  ?  ?  ?  ?  ?  ?  ?  ?  ?  ?  ?  ?  ?  ? ?  ?Prior Functioning/Environment    ?  ?  ?  ?   ? ?Frequency ? Min 2X/week  ? ? ? ? ?  ?Progress Toward Goals ? ?OT Goals(current goals can now be found in the care plan section) ? Progress towards OT goals:  Progressing toward goals ? ?   ?Plan Discharge plan remains appropriate   ? ?Co-evaluation ? ? ?   ?  ?  ?  ?  ? ?  ?AM-PAC OT "6 Clicks" Daily Activity     ?Outcome Measure ? ? Help from another person eating meals?: None ?Help from another person taking care of personal grooming?: None ?Help from another person toileting, which includes using toliet, bedpan, or urinal?: A Little ?Help from another person bathing (including washing, rinsing, drying)?: A Little ?Help from another person to put on and taking off regular upper body clothing?: None ?Help from another person to put on and taking off regular lower body clothing?: A Little ?6 Click Score: 21 ? ?  ?End of Session Equipment Utilized During Treatment: Gait belt;Rolling walker (2 wheels) ? ?OT Visit Diagnosis: Unsteadiness on feet (R26.81);Muscle weakness (generalized) (M62.81) ?  ?Activity Tolerance Patient tolerated treatment well ?  ?Patient Left with call bell/phone within reach;in bed ?  ?Nurse Communication Mobility status ?  ? ?   ? ?Time: 6503-5465 ?OT Time Calculation (min): 22 min ? ?Charges: OT General Charges ?$OT Visit: 1 Visit ?OT Treatments ?$Self Care/Home Management : 8-22 mins ? ?Jamariah Tony OTR/L, MS ?Acute Rehabilitation Department ?Office# [phone] ?Pager# [phone] ? ? ?Cheryl Singh ?09/17/2021, 3:56 PM ?

## 2021-09-17 NOTE — Progress Notes (Signed)
Patient de-sat to 72% on room air. 2L via [place] placed back on patient, tolerating well. ?

## 2021-09-17 NOTE — Consult Note (Signed)
Referral MD ? ?Reason for Referral: Metastatic bladder cancer ? ?Chief Complaint  ?Patient presents with  ? Near Syncope  ?: I just had a problem with the morphine. ? ?HPI: Cheryl Singh is a very nice 71 year old white female.  She is well-known to me.  She has metastatic bladder cancer.  She has had 1 cycle of treatment so far. ? ?She has had a lot of back discomfort.  She has bony metastasis. ? ?We have tried on different pain medications and nothing really has been all that agreeable with her. ? ?She is supposed to see radiation oncology today. ? ?She has had no change in bowel or bladder habits.  She has not noted any obvious bleeding.  She is tolerating chemotherapy pretty well. ? ?She came to the hospital on the 18th.  She had a CT angiogram that was done which did not show any pulmonary embolism. ? ?She was quite anemic.  Her white count was 9.6.  Hemoglobin 7.4.  Platelet count 605,000.  MCV is 102.  She is iron deficient.  Her iron saturation is only 10%. ? ?Her BUN is 15 creatinine 0.61.  Calcium 8.2.  Albumin 2.2. ? ?She had a urinalysis done.  Urine culture showed multiple species..  She has been transfused. ? ?She probably needs to have an MRI of the back.  I am unsure if she can lie down for the above I think be worthwhile trying. ? ?She, on the angiogram, did have a left seventh rib fracture which looked to be pathologic. ? ?I think she is gotten Xgeva in the office. ? ?Overall, her appetite is been doing okay.  She has had no nausea or vomiting.  There is no bowel or bladder incontinence. ? ?Currently, I would say her performance status is probably ECOG 1. ? ? ? ?Past Medical History:  ?Diagnosis Date  ? Arthritis   ? 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 ([place]) 08/16/2021  ? 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 ([place]) 08/16/2021  ? Goals of care, counseling/discussion 08/16/2021  ? Hypertension   ?: ? ? ?Past Surgical History:   ?Procedure Laterality Date  ? HEART CHAMBER REVISION    ? IR IMAGING GUIDED PORT INSERTION  08/20/2021  ? TRANSURETHRAL RESECTION OF BLADDER TUMOR Bilateral 07/17/2021  ? Procedure: CYSTOSCOPY BILATERAL RETROGRADES TRANSURETHRAL RESECTION OF BLADDER TUMOR (TURBT);  Surgeon: Remi Haggard, MD;  Location: [facility];  Service: Urology;  Laterality: Bilateral;  ?: ? ? ?Current Facility-Administered Medications:  ?  alum & mag hydroxide-simeth (MAALOX/MYLANTA) 200-200-20 MG/5ML suspension 30 mL, 30 mL, Oral, Q4H PRN, Kyle, Tyrone A, DO, 30 mL at 09/16/21 1908 ?  amLODipine (NORVASC) tablet 5 mg, 5 mg, Oral, Daily, Cyndia Skeeters, Taye T, MD, 5 mg at 09/16/21 1901 ?  atorvastatin (LIPITOR) tablet 10 mg, 10 mg, Oral, QPM, Kyle, Tyrone A, DO, 10 mg at 09/16/21 1749 ?  calcium carbonate (TUMS - dosed in mg elemental calcium) chewable tablet 200 mg of elemental calcium, 1 tablet, Oral, BID, Kyle, Tyrone A, DO, 200 mg of elemental calcium at 09/17/21 0011 ?  cefTRIAXone (ROCEPHIN) 1 g in sodium chloride 0.9 % 100 mL IVPB, 1 g, Intravenous, Q24H, Kyle, Tyrone A, DO, Last Rate: 200 mL/hr at 09/17/21 0530, 1 g at 09/17/21 0530 ?  cholecalciferol (VITAMIN D) tablet 2,000 Units, 2,000 Units, Oral, Daily, Kyle, Tyrone A, DO, 2,000 Units at 09/16/21 1008 ?  famotidine (PEPCID) tablet 40 mg, 40 mg,  Oral, BID, Kyle, Tyrone A, DO, 40 mg at 09/16/21 1007 ?  hydrALAZINE (APRESOLINE) tablet 25 mg, 25 mg, Oral, Q6H PRN, Gonfa, Taye T, MD ?  ipratropium-albuterol (DUONEB) 0.5-2.5 (3) MG/3ML nebulizer solution 3 mL, 3 mL, Nebulization, Q4H PRN, Cyndia Skeeters, Taye T, MD ?  losartan (COZAAR) tablet 100 mg, 100 mg, Oral, Q0600, Gonfa, Taye T, MD, 100 mg at 09/16/21 1901 ?  omega-3 acid ethyl esters (LOVAZA) capsule 1 g, 1 g, Oral, Daily, Kyle, Tyrone A, DO, 1 g at 09/16/21 1008 ?  oxyCODONE-acetaminophen (PERCOCET/ROXICET) 5-325 MG per tablet 1-2 tablet, 1-2 tablet, Oral, Q4H PRN, Marylyn Ishihara, Tyrone A, DO, 2 tablet at 09/17/21 0516 ?  pneumococcal 20-valent conjugate  vaccine (PREVNAR 20) injection 0.5 mL, 0.5 mL, Intramuscular, Tomorrow-1000, Kyle, Tyrone A, DO ?  polyethylene glycol (MIRALAX / GLYCOLAX) packet 17 g, 17 g, Oral, Daily PRN, Marylyn Ishihara, Tyrone A, DO ?  prochlorperazine (COMPAZINE) tablet 10 mg, 10 mg, Oral, Q6H PRN, Marylyn Ishihara, Tyrone A, DO: ? ? amLODipine  5 mg Oral Daily  ? atorvastatin  10 mg Oral QPM  ? calcium carbonate  1 tablet Oral BID  ? cholecalciferol  2,000 Units Oral Daily  ? famotidine  40 mg Oral BID  ? losartan  100 mg Oral Q0600  ? omega-3 acid ethyl esters  1 g Oral Daily  ? pneumococcal 20-valent conjugate vaccine  0.5 mL Intramuscular Tomorrow-1000  ?: ? ?No Known Allergies: ? ?History reviewed. No pertinent family history.: ? ? ?Social History  ? ?Socioeconomic History  ? Marital status: Single  ?  Spouse name: Not on file  ? Number of children: Not on file  ? Years of education: Not on file  ? Highest education level: Not on file  ?Occupational History  ? Not on file  ?Tobacco Use  ? Smoking status: Every Day  ?  Packs/day: 0.50  ?  Years: 40.00  ?  Pack years: 20.00  ?  Types: Cigarettes  ? Smokeless tobacco: Never  ?Vaping Use  ? Vaping Use: Never used  ?Substance and Sexual Activity  ? Alcohol use: Yes  ?  Comment: occ  ? Drug use: No  ? Sexual activity: Not on file  ?Other Topics Concern  ? Not on file  ?Social History Narrative  ? Not on file  ? ?Social Determinants of Health  ? ?Financial Resource Strain: Not on file  ?Food Insecurity: Not on file  ?Transportation Needs: Not on file  ?Physical Activity: Not on file  ?Stress: Not on file  ?Social Connections: Not on file  ?Intimate Partner Violence: Not on file  ?: ? ?Review of Systems  ?Constitutional: Negative.   ?HENT: Negative.    ?Eyes: Negative.   ?Respiratory: Negative.    ?Cardiovascular: Negative.   ?Gastrointestinal: Negative.   ?Genitourinary: Negative.   ?Musculoskeletal:  Positive for back pain.  ?Skin: Negative.   ?Neurological: Negative.   ?Endo/Heme/Allergies: Negative.    ?Psychiatric/Behavioral: Negative.    ? ? ?Exam: ?Patient Vitals for the past 24 hrs: ? BP Temp Temp src Pulse Resp SpO2  ?09/17/21 0405 (!) 166/88 98.2 ?F (36.8 ?C) Oral 97 18 100 %  ?09/16/21 1939 (!) 157/88 97.9 ?F (36.6 ?C) Oral (!) 103 16 100 %  ?09/16/21 1901 (!) 149/104 -- -- -- -- --  ?09/16/21 1715 (!) 149/104 97.6 ?F (36.4 ?C) Oral 93 14 94 %  ?09/16/21 1432 (!) 144/77 97.8 ?F (36.6 ?C) Oral 93 18 93 %  ?09/16/21 1430 (!) 144/77 -- -- --  18 93 %  ?09/16/21 1417 135/70 97.9 ?F (36.6 ?C) Oral 99 18 96 %  ?09/16/21 1251 -- -- -- -- -- 94 %  ?09/16/21 1248 (!) 142/86 97.8 ?F (36.6 ?C) Oral (!) 104 18 94 %  ? ?Physical Exam ?Vitals reviewed.  ?HENT:  ?   Head: Normocephalic and atraumatic.  ?Eyes:  ?   Pupils: Pupils are equal, round, and reactive to light.  ?Cardiovascular:  ?   Rate and Rhythm: Normal rate and regular rhythm.  ?   Heart sounds: Normal heart sounds.  ?Pulmonary:  ?   Effort: Pulmonary effort is normal.  ?   Breath sounds: Normal breath sounds.  ?Abdominal:  ?   General: Bowel sounds are normal.  ?   Palpations: Abdomen is soft.  ?Musculoskeletal:     ?   General: No tenderness or deformity. Normal range of motion.  ?   Cervical back: Normal range of motion.  ?Lymphadenopathy:  ?   Cervical: No cervical adenopathy.  ?Skin: ?   General: Skin is warm and dry.  ?   Findings: No erythema or rash.  ?Neurological:  ?   Mental Status: She is alert and oriented to person, place, and time.  ?Psychiatric:     ?   Behavior: Behavior normal.     ?   Thought Content: Thought content normal.     ?   Judgment: Judgment normal.  ? ? ?Recent Labs  ?  09/15/21 ?0020 09/15/21 ?0115 09/16/21 ?0243 09/16/21 ?1911  ?WBC 6.8  --  9.6  --   ?HGB 8.5*   < > 7.4*  7.5* 9.4*  ?HCT 27.2*   < > 23.9*  24.2* 30.1*  ?PLT 528*  --  605*  --   ? < > = values in this interval not displayed.  ? ? ?Recent Labs  ?  09/15/21 ?0020 09/15/21 ?0115 09/16/21 ?0243  ?NA 134* 134* 142  ?K 3.9 4.1 3.7  ?CL 101 100 109  ?CO2 20*  --  24   ?GLUCOSE 123* 116* 125*  ?BUN '17 15 15  '$ ?CREATININE 0.85 0.90 0.61  ?CALCIUM 7.2*  --  8.2*  ? ? ?Blood smear review: none ? ?Pathology:none ? ? ? ?Assessment and Plan: Ms. Rahming is a very nice 71 year old whit

## 2021-09-17 NOTE — Progress Notes (Signed)
?Radiation Oncology         [phone] ?________________________________ ? ?Initial Inpatient Consultation ? ?Name: Cheryl Singh MRN: [id number]  ?Date: 09/17/2021  DOB: 1950-07-21 ? ?FI:EPPIRJJOAC, Anastasia Pall, MD  Volanda Napoleon, MD  ? ?REFERRING PHYSICIAN: Volanda Napoleon, MD ? ?DIAGNOSIS: The encounter diagnosis was 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 [facility]). ? ?Metastatic stage IV bladder cancer  ? ?  ICD-10-CM   ?1. 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 [facility])  C67.9   ? C79.51   ?  ? ? ?HISTORY OF PRESENT ILLNESS::Cheryl Singh is a 71 y.o. female who is accompanied by landlord and friend. she is seen as a courtesy of Dr. Marin Olp for an opinion concerning radiation therapy as part of management for her diagnosed metastatic bladder cancer.  ? ?The patient initially report instances of hematuria and recurrent UTI's on 06/15/21 to Lottie Dawson NP [facility] at [facility]). The patient was being evaluated at the cancer center at the time for macrocytosis.  ? ?She was then referred to Dr. Milford Cage at [facility] for a cytoscopy on 07/17/21. Cytoscopy showed a mass emanating from the left side of the bladder. 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 The bladder mass was also resected at this time.  ? ?CT scan of the chest abdomen and pelvis performed on 08/02/21 unfortunately showed widespread metastatic disease, with multiple osseous metastatic lesions through out the spine, ribs, and sternum. No pathologic fractures were noted. 2 indeterminate lesions in the liver concerning for metastatic disease were also appreciated. Bladder was also noted to have probable residual disease extending through into the perivesical fat.  ? ?Subsequently, the patient was referred to Dr. Marin Olp on 08/16/21 for further evaluation and management. During this visit, the patient denied any further hematuria and denied any bony  pain. She did endorse mild fatigue but denied any other symptoms. Treatment options were discussed with the patient, and she consented to pursue systemic chemotherapy consisting of cisplatin and gemzar. She also agreed to start zometa and Xgeva for her bony disease.  ? ?PET on 08/31/21 showed: the large soft tissue mass involving the left bladder wall; a hypermetabolic lymph node in the posterior mediastinum with another hypermetabolic lymph node identified in the left groin; hypermetabolic liver metastases involving both hepatic lobes; and extensive hypermetabolic bony metastases involving the spine, ?sacrum, bony pelvis, both clavicles, bilateral ribs, and proximal right humerus. ? ?The patient was also referred to palliative medicine on 09/13/21. During which time, the patient reported ongoing left sided, sternal, and back pain. She reported that her pain to be most severe when she exerts herself, and difficulty sitting up in her recliner. Her pain was noted to be uncontrolled despite her current regimen of Percocet. The patient was advised on the importance of her consult with me here today, as radiation could also help aid her pain.  ? ?The patient recently presented to the ED on 09/14/20 with acute onset of chills, weakness, and SOB on exertion.  CXR performed was negative for any active disease. CTA of the chest showed a new likely pathologic left lateral 7th rib fracture, and her known multifocal sclerotic osseous metastases and hepatic metastases. No evidence of PE was appreciated. The patient currently remains admitted under observation.  ? ?PREVIOUS RADIATION THERAPY: No ? ?PAST MEDICAL HISTORY:  ?Past Medical History:  ?Diagnosis Date  ? Arthritis   ? 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 ([place]) 08/16/2021  ?  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 ([place]) 08/16/2021  ? Goals of care, counseling/discussion 08/16/2021  ? Hypertension    ? ? ?PAST SURGICAL HISTORY: ?Past Surgical History:  ?Procedure Laterality Date  ? HEART CHAMBER REVISION    ? IR IMAGING GUIDED PORT INSERTION  08/20/2021  ? TRANSURETHRAL RESECTION OF BLADDER TUMOR Bilateral 07/17/2021  ? Procedure: CYSTOSCOPY BILATERAL RETROGRADES TRANSURETHRAL RESECTION OF BLADDER TUMOR (TURBT);  Surgeon: Remi Haggard, MD;  Location: [facility];  Service: Urology;  Laterality: Bilateral;  ? ? ?FAMILY HISTORY: No family history on file. ? ?SOCIAL HISTORY:  ?Social History  ? ?Tobacco Use  ? Smoking status: Every Day  ?  Packs/day: 0.50  ?  Years: 40.00  ?  Pack years: 20.00  ?  Types: Cigarettes  ? Smokeless tobacco: Never  ?Vaping Use  ? Vaping Use: Never used  ?Substance Use Topics  ? Alcohol use: Yes  ?  Comment: occ  ? Drug use: No  ? ? ?ALLERGIES: No Known Allergies ? ?MEDICATIONS:  ?No current facility-administered medications for this encounter.  ? ?No current outpatient medications on file.  ? ?Facility-Administered Medications Ordered in Other Encounters  ?Medication Dose Route Frequency Provider Last Rate Last Admin  ? alum & mag hydroxide-simeth (MAALOX/MYLANTA) 200-200-20 MG/5ML suspension 30 mL  30 mL Oral Q4H PRN Marylyn Ishihara, Tyrone A, DO   30 mL at 09/16/21 1908  ? amLODipine (NORVASC) tablet 10 mg  10 mg Oral Daily Wendee Beavers T, MD   10 mg at 09/17/21 1007  ? atorvastatin (LIPITOR) tablet 10 mg  10 mg Oral QPM Kyle, Tyrone A, DO   10 mg at 09/17/21 1748  ? calcium carbonate (TUMS - dosed in mg elemental calcium) chewable tablet 200 mg of elemental calcium  1 tablet Oral BID Marylyn Ishihara, Tyrone A, DO   200 mg of elemental calcium at 09/17/21 1007  ? cefTRIAXone (ROCEPHIN) 1 g in sodium chloride 0.9 % 100 mL IVPB  1 g Intravenous Q24H Kyle, Tyrone A, DO   Stopped at 09/17/21 0600  ? Chlorhexidine Gluconate Cloth 2 % PADS 6 each  6 each Topical Daily Mercy Riding, MD   6 each at 09/17/21 1132  ? cholecalciferol (VITAMIN D) tablet 2,000 Units  2,000 Units Oral Daily Kyle, Tyrone A, DO   2,000  Units at 09/17/21 1007  ? enoxaparin (LOVENOX) injection 40 mg  40 mg Subcutaneous Daily Volanda Napoleon, MD   40 mg at 09/17/21 1008  ? famotidine (PEPCID) tablet 40 mg  40 mg Oral BID Marylyn Ishihara, Tyrone A, DO   40 mg at 09/17/21 1008  ? hydrALAZINE (APRESOLINE) tablet 25 mg  25 mg Oral Q6H PRN Gonfa, Taye T, MD      ? ipratropium-albuterol (DUONEB) 0.5-2.5 (3) MG/3ML nebulizer solution 3 mL  3 mL Nebulization Q4H PRN Gonfa, Taye T, MD      ? losartan (COZAAR) tablet 100 mg  100 mg Oral Q0600 Wendee Beavers T, MD   100 mg at 09/16/21 1901  ? omega-3 acid ethyl esters (LOVAZA) capsule 1 g  1 g Oral Daily Kyle, Tyrone A, DO   1 g at 09/17/21 1007  ? oxyCODONE (OXYCONTIN) 12 hr tablet 10 mg  10 mg Oral Q12H Volanda Napoleon, MD   10 mg at 09/17/21 1007  ? oxyCODONE-acetaminophen (PERCOCET/ROXICET) 5-325 MG per tablet 1-2 tablet  1-2 tablet Oral Q4H PRN Marylyn Ishihara, Tyrone A, DO   1  tablet at 09/17/21 1607  ? pneumococcal 20-valent conjugate vaccine (PREVNAR 20) injection 0.5 mL  0.5 mL Intramuscular Prior to discharge Mercy Riding, MD      ? polyethylene glycol (MIRALAX / GLYCOLAX) packet 17 g  17 g Oral Daily PRN Marylyn Ishihara, Tyrone A, DO      ? prochlorperazine (COMPAZINE) tablet 10 mg  10 mg Oral Q6H PRN Marylyn Ishihara, Tyrone A, DO      ? ? ?REVIEW OF SYSTEMS:  A 10+ POINT REVIEW OF SYSTEMS WAS OBTAINED including neurology, dermatology, psychiatry, cardiac, respiratory, lymph, extremities, GI, GU, musculoskeletal, constitutional, reproductive, HEENT.  Reports pain primarily in the left lateral rib cage area where she is noted to have a rib fracture at the seventh rib, likely pathologic.  She also complains of pain in the lower lumbar spine and upper sacrum area.  She denies any weakness in her lower extremities or numbness.  She denies any difficulties with bowel habits or urinary symptoms. ?  ?PHYSICAL EXAM:  ?General: Alert and oriented, in no acute distress, sitting comfortably in a hospital wheelchair ?HEENT: Head is normocephalic.  Extraocular movements are intact. ?Neck: Neck is supple, no palpable cervical or supraclavicular lymphadenopathy. ?Heart: Regular in rate and rhythm with no murmurs, rubs, or gallops. ?Chest: Clear to auscultation bilat

## 2021-09-18 ENCOUNTER — Ambulatory Visit: Payer: Medicare PPO

## 2021-09-18 ENCOUNTER — Ambulatory Visit: Payer: Medicare PPO | Admitting: Hematology & Oncology

## 2021-09-18 ENCOUNTER — Other Ambulatory Visit: Payer: Medicare PPO

## 2021-09-18 ENCOUNTER — Encounter: Payer: Self-pay | Admitting: *Deleted

## 2021-09-18 ENCOUNTER — Ambulatory Visit
Admit: 2021-09-18 | Discharge: 2021-09-18 | Disposition: A | Discharge status: Home / Self Care | Payer: Medicare PPO | Attending: Radiation Oncology | Admitting: Radiation Oncology

## 2021-09-18 DIAGNOSIS — J9601 Acute respiratory failure with hypoxia: Secondary | ICD-10-CM | POA: Diagnosis not present

## 2021-09-18 DIAGNOSIS — I951 Orthostatic hypotension: Secondary | ICD-10-CM | POA: Diagnosis not present

## 2021-09-18 DIAGNOSIS — C679 Malignant neoplasm of bladder, unspecified: Secondary | ICD-10-CM | POA: Diagnosis not present

## 2021-09-18 DIAGNOSIS — I491 Atrial premature depolarization: Secondary | ICD-10-CM

## 2021-09-18 DIAGNOSIS — C7951 Secondary malignant neoplasm of bone: Secondary | ICD-10-CM

## 2021-09-18 DIAGNOSIS — E878 Other disorders of electrolyte and fluid balance, not elsewhere classified: Secondary | ICD-10-CM

## 2021-09-18 DIAGNOSIS — D649 Anemia, unspecified: Secondary | ICD-10-CM | POA: Diagnosis not present

## 2021-09-18 LAB — CBC WITH DIFFERENTIAL/PLATELET
Abs Immature Granulocytes: 1.8 10*3/uL — ABNORMAL HIGH (ref 0.00–0.07)
Band Neutrophils: 7 %
Basophils Absolute: 0.1 10*3/uL (ref 0.0–0.1)
Basophils Relative: 1 %
Eosinophils Absolute: 0 10*3/uL (ref 0.0–0.5)
Eosinophils Relative: 0 %
HCT: 29.2 % — ABNORMAL LOW (ref 36.0–46.0)
Hemoglobin: 9.3 g/dL — ABNORMAL LOW (ref 12.0–15.0)
Lymphocytes Relative: 11 %
Lymphs Abs: 1.6 10*3/uL (ref 0.7–4.0)
MCH: 31.2 pg (ref 26.0–34.0)
MCHC: 31.8 g/dL (ref 30.0–36.0)
MCV: 98 fL (ref 80.0–100.0)
Metamyelocytes Relative: 7 %
Monocytes Absolute: 0.6 10*3/uL (ref 0.1–1.0)
Monocytes Relative: 4 %
Myelocytes: 4 %
Neutro Abs: 10.7 10*3/uL — ABNORMAL HIGH (ref 1.7–7.7)
Neutrophils Relative %: 65 %
Platelets: 591 10*3/uL — ABNORMAL HIGH (ref 150–400)
Promyelocytes Relative: 1 %
RBC: 2.98 MIL/uL — ABNORMAL LOW (ref 3.87–5.11)
RDW: 14.5 % (ref 11.5–15.5)
WBC: 14.9 10*3/uL — ABNORMAL HIGH (ref 4.0–10.5)
nRBC: 0.4 % — ABNORMAL HIGH (ref 0.0–0.2)

## 2021-09-18 LAB — COMPREHENSIVE METABOLIC PANEL
ALT: 17 U/L (ref 0–44)
AST: 21 U/L (ref 15–41)
Albumin: 2.3 g/dL — ABNORMAL LOW (ref 3.5–5.0)
Alkaline Phosphatase: 179 U/L — ABNORMAL HIGH (ref 38–126)
Anion gap: 7 (ref 5–15)
BUN: 6 mg/dL — ABNORMAL LOW (ref 8–23)
CO2: 29 mmol/L (ref 22–32)
Calcium: 7.4 mg/dL — ABNORMAL LOW (ref 8.9–10.3)
Chloride: 100 mmol/L (ref 98–111)
Creatinine, Ser: 0.52 mg/dL (ref 0.44–1.00)
GFR, Estimated: >60 mL/min (ref >60)
Glucose, Bld: 128 mg/dL — ABNORMAL HIGH (ref 70–99)
Potassium: 3.5 mmol/L (ref 3.5–5.1)
Sodium: 136 mmol/L (ref 135–145)
Total Bilirubin: 0.4 mg/dL (ref 0.3–1.2)
Total Protein: 5.7 g/dL — ABNORMAL LOW (ref 6.5–8.1)

## 2021-09-18 LAB — URINALYSIS, ROUTINE W REFLEX MICROSCOPIC
Bilirubin Urine: NEGATIVE
Glucose, UA: NEGATIVE mg/dL
Ketones, ur: NEGATIVE mg/dL
Nitrite: POSITIVE — AB
Protein, ur: 100 mg/dL — AB
Specific Gravity, Urine: 1.015 (ref 1.005–1.030)
pH: 7 (ref 5.0–8.0)

## 2021-09-18 LAB — MAGNESIUM: Magnesium: 1.6 mg/dL — ABNORMAL LOW (ref 1.7–2.4)

## 2021-09-18 LAB — URINALYSIS, MICROSCOPIC (REFLEX)
RBC / HPF: >50 RBC/hpf (ref 0–5)
WBC, UA: >50 WBC/hpf (ref 0–5)

## 2021-09-18 LAB — PHOSPHORUS: Phosphorus: 2.8 mg/dL (ref 2.5–4.6)

## 2021-09-18 LAB — URINE CULTURE

## 2021-09-18 MED ORDER — MEROPENEM 1 G IV SOLR
1.0000 g | Freq: Three times a day (TID) | INTRAVENOUS | Status: DC
  Administered 2021-09-18 – 2021-09-21 (×9): 1 g via INTRAVENOUS
  Filled 2021-09-18: qty 20
  Filled 2021-09-18: qty 1
  Filled 2021-09-18 (×2): qty 20
  Filled 2021-09-18 (×2): qty 1
  Filled 2021-09-18 (×3): qty 20
  Filled 2021-09-18: qty 1

## 2021-09-18 NOTE — Progress Notes (Signed)
Patient went to the ED over the weekend due to SOB and possible adverse reaction to her new Morphine prescription. She will start radiation inpatient. She is due her next cycle of chemo today, but this will be delayed. Will follow for post discharge needs and office follow up.  ? ?Oncology Nurse Navigator Documentation ? ?Oncology Nurse Navigator Flowsheets 09/18/2021  ?Abnormal Finding Date -  ?Confirmed Diagnosis Date -  ?Diagnosis Status -  ?Planned Course of Treatment -  ?Phase of Treatment -  ?Chemotherapy Actual Start Date: -  ?Chemotherapy Expected End Date: -  ?Navigator Follow Up Date: 09/21/2021  ?Navigator Follow Up Reason: Appointment Review  ?Navigator Location [facility]  ?Referral Date to RadOnc/MedOnc -  ?Navigator Encounter Type Appt/Treatment Plan Review  ?Telephone -  ?Treatment Initiated Date -  ?Patient Visit Type MedOnc  ?Treatment Phase Active Tx  ?Barriers/Navigation Needs Coordination of Care;Education  ?Education -  ?Interventions None Required  ?Acuity Level 2-Minimal Needs (1-2 Barriers Identified)  ?Referrals -  ?Coordination of Care -  ?Education Method -  ?Support Groups/Services Friends and Family  ?Time Spent with Patient 15  ?  ?

## 2021-09-18 NOTE — Assessment & Plan Note (Addendum)
K3.5.  Mg 1.5. ?-P.o. KCl 40x1 ?-IV magnesium sulfate 2 g x 1 ?

## 2021-09-18 NOTE — Progress Notes (Addendum)
?PROGRESS NOTE ? ?Cheryl Singh OVF:[id number] DOB: 25-May-1951  ? ?PCP: Glenis Smoker, MD ? ?Patient is from: Home.  Lives with friend. ? ?DOA: 09/14/2021 LOS: 2 ? ?Chief complaints ?Chief Complaint  ?Patient presents with  ? Near Syncope  ?  ? ?Brief Narrative / Interim history: ?71 year old F with PMH of stage IV bladder cancer s/p TURBT last month, anemia, HTN, HLD and GERD presenting with generalized weakness, chills, shaking, lightheadedness, shortness of breath and abdominal bloating/pain, and admitted for near syncope, orthostatic hypotension and possible UTI.  Patient had pain medication increased recently.  UA concerning for UTI but she denies UTI symptoms.  Urology consulted and recommended empiric antibiotics.  She was started on IV ceftriaxone and IV fluid and admitted. ? ?The next day, patient was ambulated and desaturated to 84% on room air.  She has no history of COPD, asthma or CHF but lifelong smoking history.  Started inhalers and nebulizers. ? ?Hemoglobin trended down to 7.4.  She has had TURBT two months ago.  Some microscopic hematuria on UA but no gross hematuria.  She denies melena or hematochezia.  H&H stable after transfusion. ? ?Patient was on IV ceftriaxone for possible UTI.  She has no UTI symptoms but UA concerning.  Urine cultures with multiple species x2.  Now with mild temp and rising leukocytosis.  Started IV meropenem.  Recheck UA and urine culture from catheterized sample. ? ?Oncology and radiation oncology following. ?  ? ?Subjective: ?Seen and examined earlier this morning.  No major events overnight of this morning.  No specific complaints other than dyspnea with exertion.  She denies palpitation, chest pain, GI or UTI symptoms. ? ?Objective: ?Vitals:  ? 09/17/21 1548 09/17/21 2102 09/18/21 0548 09/18/21 1439  ?BP: (!) 149/87 (!) 153/73 (!) 164/96 116/72  ?Pulse: (!) 101 (!) 107 (!) 110 84  ?Resp: '16 18 14 18  '$ ?Temp: 97.7 ?F (36.5 ?C) 99.4 ?F (37.4 ?C) 98.7 ?F (37.1  ?C) 97.7 ?F (36.5 ?C)  ?TempSrc: Oral Oral Oral Oral  ?SpO2:  90% 93%   ?Weight: 75.8 kg     ?Height: 5' 4.49" (1.638 m)     ? ? ?Examination: ? ?GENERAL: No apparent distress.  Nontoxic. ?HEENT: MMM.  Vision and hearing grossly intact.  ?NECK: Supple.  No apparent JVD.  ?RESP:  No IWOB.  Fair aeration bilaterally. ?CVS: Tachycardic to 100 with PACs.  Heart sounds normal.  ?ABD/GI/GU: BS+. Abd soft, NTND.  ?MSK/EXT:  Moves extremities. No apparent deformity. No edema.  ?SKIN: no apparent skin lesion or wound ?NEURO: Awake and alert. Oriented appropriately.  No apparent focal neuro deficit. ?PSYCH: Calm. Normal affect.  ?Procedures:  ?None ? ?Microbiology summarized: ?COVID-19 and influenza PCR nonreactive. ?Blood cultures NGTD. ?Initial urine culture with multiple species. ?Repeat urine culture with multiple species. ? ?Assessment and Plan: ?* Orthostatic hypotension ?Initial orthostatic vitals positive from lying to sitting.  Repeat orthostatic vitals negative.  This is likely due to opiate medication and symptomatic anemia. ?-Transfused 1 unit with appropriate response for anemia ?-Stopped morphine.  Tolerating oxycodone. ?-Fall precaution ?-PT/OT ? ?Symptomatic anemia ?Recent Labs  ?  08/16/21 ?1104 08/28/21 ?0816 09/04/21 ?1102 09/15/21 ?0020 09/15/21 ?0115 09/15/21 ?1328 09/15/21 ?2008 09/16/21 ?0243 09/16/21 ?1911 09/18/21 ?0959  ?HGB 11.4* 10.2* 10.0* 8.5* 7.8* 7.9* 7.9* 7.4*  7.5* 9.4* 9.3*  ?Denies melena or hematochezia.  UA with microscopic hematuria.  She had TURBT about 2 months ago.  Transfused 1 unit with appropriate response.  Symptoms improved. ?-Monitor  H&H ? ?UTI (urinary tract infection) ?Patient denies UTI symptoms but UA concerning.  She had TURBT about 2 months ago.  Urine culture with multiple species x2.  History of ESBL.  Now with rising leukocytosis and mild pain.. ?-Change IV ceftriaxone to IV meropenem ?-Check urinalysis and urine culture from catheterized sample. ? ?Acute respiratory  failure with hypoxia ([place]) ?Desaturated to 84% with ambulation on room air.  Not on oxygen at home.  No respiratory symptoms.  No history of COPD, asthma and CHF but lifelong smoker.  CTA chest negative for PE or pneumonia. ?-Continue LAMA/LABA and DuoNeb as needed ?-Incentive spirometry, OOB ?-Wean oxygen as able.  May need home oxygen on discharge. ? ?Hypokalemia and hypomagnesemia ?K3.5.  Mg 1.5. ?-P.o. KCl 40x1 ?-IV magnesium sulfate 2 g x 1 ? ?Premature atrial contraction ?Chronic.  Heart rate improved. ?-Consider changing amlodipine to Cardizem if tachycardic ? ?Cancer related pain ?Patient is on MS Contin and oxycodone.  Dose increased 2 days prior to presentation. ?-Oncology started OxyContin since she did not tolerate morphine. ?-Continue Percocet as needed ?-Bowel regimen ? 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 ?-Starting radiation treatment ? ?Closed fracture of rib on left side ?Noted on CTA chest.  Thought to be pathologic from metastatic lesion. ?-Supportive care ? ?Generalized weakness ?PT/OT ? ?HTN (hypertension) ?Blood pressure slightly elevated. ?-Increased home amlodipine ?-Continue home losartan 100 mg daily ?-Continue p.o. hydralazine as needed with parameters. ? ?Hypocalcemia ?Corrects to normal for hypoalbuminemia. ? ? ? ? ?  ? ?Body mass index is 28.23 kg/m?. ?  ?  ?  ?  ?DVT prophylaxis:  ?enoxaparin (LOVENOX) injection 40 mg Start: 09/17/21 1000 ?SCDs Start: 09/15/21 0857 ? ?Code Status: Full code ?Family Communication: Patient and/or RN. Available if any question.  ?Level of care: Telemetry ?Status is: Inpatient ?The patient will remain inpatient because: Acute respiratory failure with hypoxia, symptomatic anemia and possible urinary tract infection ? ? ?Final disposition: Home ? ?Consultants:  ?Urology-signed off ?Oncology ?Radiation oncology ? ?Sch Meds:  ?Scheduled Meds: ? amLODipine  10 mg Oral Daily  ? atorvastatin  10 mg Oral QPM  ?  calcium carbonate  1 tablet Oral BID  ? Chlorhexidine Gluconate Cloth  6 each Topical Daily  ? cholecalciferol  2,000 Units Oral Daily  ? enoxaparin (LOVENOX) injection  40 mg Subcutaneous Daily  ? famotidine  40 mg Oral BID  ? losartan  100 mg Oral Q0600  ? omega-3 acid ethyl esters  1 g Oral Daily  ? oxyCODONE  10 mg Oral Q12H  ? ?Continuous Infusions: ? ? ?PRN Meds:.alum & mag hydroxide-simeth, hydrALAZINE, ipratropium-albuterol, oxyCODONE-acetaminophen, pneumococcal 20-valent conjugate vaccine, polyethylene glycol, prochlorperazine ? ?Antimicrobials: ?Anti-infectives (From admission, onward)  ? ? Start     Dose/Rate Route Frequency Ordered Stop  ? 09/15/21 0630  cefTRIAXone (ROCEPHIN) 1 g in sodium chloride 0.9 % 100 mL IVPB  Status:  Discontinued       ? 1 g ?200 mL/hr over 30 Minutes Intravenous Every 24 hours 09/15/21 0609 09/18/21 1544  ? ?  ? ? ? ?I have personally reviewed the following labs and images: ?CBC: ?Recent Labs  ?Lab 09/15/21 ?0020 09/15/21 ?0115 09/15/21 ?1328 09/15/21 ?2008 09/16/21 ?0243 09/16/21 ?1911 09/18/21 ?0959  ?WBC 6.8  --   --   --  9.6  --  14.9*  ?NEUTROABS 3.1  --   --   --   --   --  10.7*  ?HGB 8.5*   < >  7.9* 7.9* 7.4*  7.5* 9.4* 9.3*  ?HCT 27.2*   < > 25.3* 25.5* 23.9*  24.2* 30.1* 29.2*  ?MCV 101.5*  --   --   --  101.7*  --  98.0  ?PLT 528*  --   --   --  605*  --  591*  ? < > = values in this interval not displayed.  ? ?BMP &GFR ?Recent Labs  ?Lab 09/15/21 ?0020 09/15/21 ?0115 09/16/21 ?2257 09/18/21 ?5051  ?NA 134* 134* 142 136  ?K 3.9 4.1 3.7 3.5  ?CL 101 100 109 100  ?CO2 20*  --  24 29  ?GLUCOSE 123* 116* 125* 128*  ?BUN '17 15 15 '$ 6*  ?CREATININE 0.85 0.90 0.61 0.52  ?CALCIUM 7.2*  --  8.2* 7.4*  ?MG  --   --   --  1.6*  ?PHOS  --   --   --  2.8  ? ?Estimated Creatinine Clearance: 66.8 mL/min (by C-G formula based on SCr of 0.52 mg/dL). ?Liver & Pancreas: ?Recent Labs  ?Lab 09/15/21 ?0020 09/16/21 ?8335 09/18/21 ?8251  ?AST '23 20 21  '$ ?ALT '23 19 17  '$ ?ALKPHOS 174* 161*  179*  ?BILITOT 0.3 <0.1* 0.4  ?PROT 5.6* 5.6* 5.7*  ?ALBUMIN 2.3* 2.0* 2.3*  ? ?No results for input(s): LIPASE, AMYLASE in the last 168 hours. ?No results for input(s): AMMONIA in the last 168 hours. ?Diabeti

## 2021-09-18 NOTE — Progress Notes (Signed)
Pharmacy Antibiotic Note ? ?Cheryl Singh is a 71 y.o. female with metastatic bladder cancer (s/p TURBT) presented to the ED on 09/14/2021 with syncope, chills and SOB.  She was started on ceftriaxone on admission for suspected UTI.  With leukocytosis and Tmax 99.4 on 3/21, pharmacy has been consulted to broaden abx to meropenem. ? ?3/18 CTX >> 3/21 ?3/21 meropenem>> ? ?3/18 BCx: NGTD  ?3/19 Ucx: mult species  ? ?Today, 09/18/2021: ?- day #4 ceftriaxone ?- scr 0.52 (crcl~67) ? ? ?Plan: ?- meropenem 1gm IV q8h ? ?___________________________________ ? ?Height: 5' 4.49" (163.8 cm) ?Weight: 75.8 kg (167 lb 0.1 oz) ?IBW/kg (Calculated) : 55.82 ? ?Temp (24hrs), Avg:98.6 ?F (37 ?C), Min:97.7 ?F (36.5 ?C), Max:99.4 ?F (37.4 ?C) ? ?Recent Labs  ?Lab 09/15/21 ?0020 09/15/21 ?0100 09/15/21 ?0115 09/16/21 ?0243 09/18/21 ?5997  ?WBC 6.8  --   --  9.6 14.9*  ?CREATININE 0.85  --  0.90 0.61 0.52  ?LATICACIDVEN  --  1.7  --   --   --   ?  ?Estimated Creatinine Clearance: 66.8 mL/min (by C-G formula based on SCr of 0.52 mg/dL).   ? ?No Known Allergies ? ? ?Thank you for allowing pharmacy to be a part of this patient?s care. ? Lynelle Doctor ?09/18/2021 3:54 PM ? ?

## 2021-09-18 NOTE — Progress Notes (Signed)
Patient weaned to 1L [place] sating the in low 90's. Patient then weaned off O2, sating in the low-mid 80's. Patient placed back on 1L [place]. Current O2 saturation 92% on 1L ?

## 2021-09-18 NOTE — Assessment & Plan Note (Signed)
Chronic.  Heart rate improved. ?-Consider changing amlodipine to Cardizem if tachycardic ?

## 2021-09-18 NOTE — Progress Notes (Signed)
Cheryl Singh will start radiation therapy today.  I do appreciate Radiation Oncology for the help. ? ?When I listen to her today, it certainly sounds like she has atrial fibrillation.  I cannot hear this yesterday.  We will get an EKG on her. ? ?She has had no problems with cough or shortness of breath.  There is no nausea or vomiting. ? ?She is tolerating the OxyContin and oxycodone pretty well.  It seems like back pain seem to doing pretty well. ? ?She is eating okay.  There is no problems with diarrhea.  She has had no bleeding. ? ?Her vital signs are all stable.  Temperature 98.7.  Pulse 110.  Blood pressure 164/96.  Her lungs sound clear bilaterally.  She has good air movement bilaterally.  Cardiac exam is regular rate and irregular rhythm consistent with atrial fibrillation.  She has no murmurs, rubs or bruits.  Abdomen is soft.  Bowel sounds are present.  There is no fluid wave.  Extremity shows no clubbing, cyanosis or edema.  Neurological exam is nonfocal. ? ?I think the problem right now might be the atrial fibrillation.  We will get an EKG on her.  If she does have atrial fibrillation, I would certainly get her on anticoagulation. ? ?I think she does have atrial fibrillation, she probably will need to have an echocardiogram done. ? ?Do not see any problems with her having radiation therapy. ? ?She is post to have chemotherapy today.  We will probably just delay this week. ? ?I know that she will get incredible care from all the staff upon 6 E. ? ?Cheryl Haw, MD ? ?Darlyn Chamber 30:17 ?

## 2021-09-19 ENCOUNTER — Ambulatory Visit
Admit: 2021-09-19 | Discharge: 2021-09-19 | Disposition: A | Discharge status: Home / Self Care | Payer: Medicare PPO | Attending: Radiation Oncology | Admitting: Radiation Oncology

## 2021-09-19 DIAGNOSIS — I951 Orthostatic hypotension: Secondary | ICD-10-CM | POA: Diagnosis not present

## 2021-09-19 LAB — CBC WITH DIFFERENTIAL/PLATELET
Abs Immature Granulocytes: 1.9 10*3/uL — ABNORMAL HIGH (ref 0.00–0.07)
Band Neutrophils: 5 %
Basophils Absolute: 0 10*3/uL (ref 0.0–0.1)
Basophils Relative: 0 %
Eosinophils Absolute: 0 10*3/uL (ref 0.0–0.5)
Eosinophils Relative: 0 %
HCT: 27.4 % — ABNORMAL LOW (ref 36.0–46.0)
Hemoglobin: 8.8 g/dL — ABNORMAL LOW (ref 12.0–15.0)
Lymphocytes Relative: 3 %
Lymphs Abs: 0.4 10*3/uL — ABNORMAL LOW (ref 0.7–4.0)
MCH: 31.4 pg (ref 26.0–34.0)
MCHC: 32.1 g/dL (ref 30.0–36.0)
MCV: 97.9 fL (ref 80.0–100.0)
Metamyelocytes Relative: 4 %
Monocytes Absolute: 0.4 10*3/uL (ref 0.1–1.0)
Monocytes Relative: 3 %
Myelocytes: 9 %
Neutro Abs: 11.9 10*3/uL — ABNORMAL HIGH (ref 1.7–7.7)
Neutrophils Relative %: 76 %
Platelets: 524 10*3/uL — ABNORMAL HIGH (ref 150–400)
RBC: 2.8 MIL/uL — ABNORMAL LOW (ref 3.87–5.11)
RDW: 14.3 % (ref 11.5–15.5)
WBC: 14.7 10*3/uL — ABNORMAL HIGH (ref 4.0–10.5)
nRBC: 0.3 % — ABNORMAL HIGH (ref 0.0–0.2)

## 2021-09-19 LAB — COMPREHENSIVE METABOLIC PANEL
ALT: 14 U/L (ref 0–44)
AST: 18 U/L (ref 15–41)
Albumin: 2.2 g/dL — ABNORMAL LOW (ref 3.5–5.0)
Alkaline Phosphatase: 173 U/L — ABNORMAL HIGH (ref 38–126)
Anion gap: 8 (ref 5–15)
BUN: 6 mg/dL — ABNORMAL LOW (ref 8–23)
CO2: 28 mmol/L (ref 22–32)
Calcium: 7.1 mg/dL — ABNORMAL LOW (ref 8.9–10.3)
Chloride: 97 mmol/L — ABNORMAL LOW (ref 98–111)
Creatinine, Ser: 0.53 mg/dL (ref 0.44–1.00)
GFR, Estimated: >60 mL/min (ref >60)
Glucose, Bld: 114 mg/dL — ABNORMAL HIGH (ref 70–99)
Potassium: 3.6 mmol/L (ref 3.5–5.1)
Sodium: 133 mmol/L — ABNORMAL LOW (ref 135–145)
Total Bilirubin: 0.2 mg/dL — ABNORMAL LOW (ref 0.3–1.2)
Total Protein: 5.5 g/dL — ABNORMAL LOW (ref 6.5–8.1)

## 2021-09-19 LAB — MAGNESIUM: Magnesium: 1.6 mg/dL — ABNORMAL LOW (ref 1.7–2.4)

## 2021-09-19 MED ORDER — DILTIAZEM HCL 30 MG PO TABS
30.0000 mg | ORAL_TABLET | Freq: Four times a day (QID) | ORAL | Status: DC
  Administered 2021-09-19 – 2021-09-22 (×13): 30 mg via ORAL
  Filled 2021-09-19 (×13): qty 1

## 2021-09-19 MED ORDER — CALCIUM GLUCONATE-NACL 2-0.675 GM/100ML-% IV SOLN
2.0000 g | Freq: Once | INTRAVENOUS | Status: AC
  Administered 2021-09-19: 2000 mg via INTRAVENOUS
  Filled 2021-09-19: qty 100

## 2021-09-19 MED ORDER — ACETAMINOPHEN 325 MG PO TABS
650.0000 mg | ORAL_TABLET | Freq: Once | ORAL | Status: AC
  Administered 2021-09-19: 650 mg via ORAL
  Filled 2021-09-19: qty 2

## 2021-09-19 NOTE — Progress Notes (Signed)
?PROGRESS NOTE ? ? ? ?English Tomer Strick  VQM:[id number] DOB: 1951-03-10 DOA: 09/14/2021 ?PCP: Glenis Smoker, MD  ? ?Brief Narrative: 71 year old F with PMH of stage IV bladder cancer s/p TURBT last month, anemia, HTN, HLD and GERD presenting with generalized weakness, chills, shaking, lightheadedness, shortness of breath and abdominal bloating/pain, and admitted for near syncope, orthostatic hypotension and possible UTI.  Patient had pain medication increased recently.  UA concerning for UTI but she denies UTI symptoms.  Urology consulted and recommended empiric antibiotics.  She was started on IV ceftriaxone and IV fluid and admitted. ?Patient ambulated with desaturation down to 84% on room air.  She was started on inhalers and nebulizers.  She has no history of asthma COPD or CHF but she is smoked her lifelong.  She received 1 unit of blood transfusion for a hemoglobin of 7.4 with no evidence of active bleeding hematuria melena or hematochezia.  Patient was initially treated with Rocephin for possible UTI in the setting of bladder cancer.  However she had fever and leukocytosis which was worsening.  At that point meropenem was started.  Urine culture from the 18th and 19th showed mixed growth.  A catheterized sample was sent on 09/18/2021 which is in process. ?Blood cultures no growth, COVID and influenza negative ? ?Assessment & Plan: ?  ?Principal Problem: ?  Orthostatic hypotension ?Active Problems: ?  UTI (urinary tract infection) ?  Symptomatic anemia ?  Acute respiratory failure with hypoxia ([place]) ?  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  Cancer related pain ?  Premature atrial contraction ?  Hypokalemia and hypomagnesemia ?  Hypocalcemia ?  HTN (hypertension) ?  HLD (hyperlipidemia) ?  GERD (gastroesophageal reflux disease) ?  Generalized weakness ?  Closed fracture of rib on left side ? ?Orthostatic hypotension on admission has been resolved.  She received IV fluids and 1 unit of packed RBC.   Morphine was stopped. ? ?Urinary tract infection UA consistent with UTI.  She is status post TURBT about 2 months ago.  Urology recommended to continue antibiotics. ?Was on Rocephin changed to meropenem since patient continued to have fever and leukocytosis which was worsening. ?Catheterized urine sample sent on 09/18/2021 in process ? ?Symptomatic anemia with hemoglobin down to 7.4.  No evidence of active bleeding.  Microscopic hematuria noted.  She received 1 unit of packed RBC with improvement in hemoglobin and her symptoms. ? ?Acute hypoxic respiratory failure CT a chest no evidence of PE or pneumonia.  Saturation 84% on room air placed on 2 L.  Continue to encourage her to use incentive spirometer which she has been using faithfully.  Continue nebulizer and inhalers. ?Check ambulatory O2 saturation. ? ?Hypokalemia/hypomagnesemia/hypocalcemia-calcium 3.6 magnesium 1.6 calcium 7.1.  Corrected calcium is 8.5.  Replete magnesium. ? ?Metastatic bladder cancer followed by radiation oncology and oncology.  Started on OxyContin with Percocet for pain control.  Started XRT 09/18/2021. ? ?Hypertension-on losartan 100 mg daily. ?Will change Norvasc to Cardizem due to persistent tachycardia. ? ?Left rib closed fracture possibly pathologic from metastatic lesion ? ? ?Estimated body mass index is 28.23 kg/m? as calculated from the following: ?  Height as of this encounter: 5' 4.49" (1.638 m). ?  Weight as of this encounter: 75.8 kg. ? ?DVT prophylaxis: Lovenox ?Code Status: Full code  ?family Communication: None at bedside  ?disposition Plan:  Status is: Inpatient ?Remains inpatient appropriate because: On IV antibiotics ?  ?Consultants:  ?Oncology and radiation oncology ? ?Procedures: None ?Antimicrobials: Meropenem ? ?Subjective: ?  She is resting in bed ?Has a port in place infusing meropenem ?Feels weak ?Hypertensive tachycardic ? ?Objective: ?Vitals:  ? 09/18/21 0548 09/18/21 1439 09/18/21 1936 09/19/21 0429  ?BP: (!)  164/96 116/72 135/74 (!) 142/70  ?Pulse: (!) 110 84 98 (!) 106  ?Resp: '14 18 16 20  '$ ?Temp: 98.7 ?F (37.1 ?C) 97.7 ?F (36.5 ?C) 97.8 ?F (36.6 ?C) 100.2 ?F (37.9 ?C)  ?TempSrc: Oral Oral Oral Oral  ?SpO2: 93%  97% 93%  ?Weight:      ?Height:      ? ? ?Intake/Output Summary (Last 24 hours) at 09/19/2021 1216 ?Last data filed at 09/19/2021 4818 ?Gross per 24 hour  ?Intake 420.81 ml  ?Output 1450 ml  ?Net -1029.19 ml  ? ?Filed Weights  ? 09/17/21 1548  ?Weight: 75.8 kg  ? ? ?Examination: ? ?General exam: Appears in no acute distress ?Respiratory system: Clear to auscultation. Respiratory effort normal. ?Cardiovascular system: S1 & S2 heard, RRR. No JVD, murmurs, rubs, gallops or clicks. No pedal edema.  Tachycardic ?Gastrointestinal system: Abdomen is nondistended, soft and nontender. No organomegaly or masses felt. Normal bowel sounds heard. ?Central nervous system: Alert and oriented. No focal neurological deficits.  Moves all extremities ?Extremities: No edema ?Skin: No rashes, lesions or ulcers ?Psychiatry: Judgement and insight appear normal. Mood & affect appropriate.  ? ? ? ?Data Reviewed: I have personally reviewed following labs and imaging studies ? ?CBC: ?Recent Labs  ?Lab 09/15/21 ?0020 09/15/21 ?0115 09/15/21 ?2008 09/16/21 ?0243 09/16/21 ?1911 09/18/21 ?5631 09/19/21 ?0604  ?WBC 6.8  --   --  9.6  --  14.9* 14.7*  ?NEUTROABS 3.1  --   --   --   --  10.7* 11.9*  ?HGB 8.5*   < > 7.9* 7.4*  7.5* 9.4* 9.3* 8.8*  ?HCT 27.2*   < > 25.5* 23.9*  24.2* 30.1* 29.2* 27.4*  ?MCV 101.5*  --   --  101.7*  --  98.0 97.9  ?PLT 528*  --   --  605*  --  591* 524*  ? < > = values in this interval not displayed.  ? ?Basic Metabolic Panel: ?Recent Labs  ?Lab 09/15/21 ?0020 09/15/21 ?0115 09/16/21 ?4970 09/18/21 ?2637 09/19/21 ?8588  ?NA 134* 134* 142 136 133*  ?K 3.9 4.1 3.7 3.5 3.6  ?CL 101 100 109 100 97*  ?CO2 20*  --  '24 29 28  '$ ?GLUCOSE 123* 116* 125* 128* 114*  ?BUN '17 15 15 '$ 6* 6*  ?CREATININE 0.85 0.90 0.61 0.52 0.53   ?CALCIUM 7.2*  --  8.2* 7.4* 7.1*  ?MG  --   --   --  1.6* 1.6*  ?PHOS  --   --   --  2.8  --   ? ?GFR: ?Estimated Creatinine Clearance: 66.8 mL/min (by C-G formula based on SCr of 0.53 mg/dL). ?Liver Function Tests: ?Recent Labs  ?Lab 09/15/21 ?0020 09/16/21 ?5027 09/18/21 ?7412 09/19/21 ?8786  ?AST '23 20 21 18  '$ ?ALT '23 19 17 14  '$ ?ALKPHOS 174* 161* 179* 173*  ?BILITOT 0.3 <0.1* 0.4 0.2*  ?PROT 5.6* 5.6* 5.7* 5.5*  ?ALBUMIN 2.3* 2.0* 2.3* 2.2*  ? ?No results for input(s): LIPASE, AMYLASE in the last 168 hours. ?No results for input(s): AMMONIA in the last 168 hours. ?Coagulation Profile: ?No results for input(s): INR, PROTIME in the last 168 hours. ?Cardiac Enzymes: ?No results for input(s): CKTOTAL, CKMB, CKMBINDEX, TROPONINI in the last 168 hours. ?BNP (last 3 results) ?No results for input(s): PROBNP  in the last 8760 hours. ?HbA1C: ?No results for input(s): HGBA1C in the last 72 hours. ?CBG: ?No results for input(s): GLUCAP in the last 168 hours. ?Lipid Profile: ?No results for input(s): CHOL, HDL, LDLCALC, TRIG, CHOLHDL, LDLDIRECT in the last 72 hours. ?Thyroid Function Tests: ?No results for input(s): TSH, T4TOTAL, FREET4, T3FREE, THYROIDAB in the last 72 hours. ?Anemia Panel: ?No results for input(s): VITAMINB12, FOLATE, FERRITIN, TIBC, IRON, RETICCTPCT in the last 72 hours. ?Sepsis Labs: ?Recent Labs  ?Lab 09/15/21 ?0100  ?LATICACIDVEN 1.7  ? ? ?Recent Results (from the past 240 hour(s))  ?Resp Panel by RT-PCR (Flu A&B, Covid) Nasopharyngeal Swab     Status: None  ? Collection Time: 09/15/21 12:11 AM  ? Specimen: Nasopharyngeal Swab; Nasopharyngeal(NP) swabs in vial transport medium  ?Result Value Ref Range Status  ? SARS Coronavirus 2 by RT PCR NEGATIVE NEGATIVE Final  ?  Comment: (NOTE) ?SARS-CoV-2 target nucleic acids are NOT DETECTED. ? ?The SARS-CoV-2 RNA is generally detectable in upper respiratory ?specimens during the acute phase of infection. The lowest ?concentration of SARS-CoV-2 viral copies this  assay can detect is ?138 copies/mL. A negative result does not preclude SARS-Cov-2 ?infection and should not be used as the sole basis for treatment or ?other patient management decisions. A negative result may o

## 2021-09-19 NOTE — Progress Notes (Deleted)
OT Cancellation Note ? ?Patient Details ?Name: Cheryl Singh ?MRN: [id number] ?DOB: 09-09-1950 ? ? ?Cancelled Treatment:    Reason Eval/Treat Not Completed: Patient at procedure or test/ unavailable ?Patient is at radiation. OT to continue to follow and check back as schedule will allow.  ?Jenisis Harmsen OTR/L, MS ?Acute Rehabilitation Department ?Office# [phone] ?Pager# [phone] ? ?09/19/2021, 12:59 PM ?

## 2021-09-19 NOTE — Progress Notes (Signed)
Ms. Schlagel is doing okay.  She is not having a lot of pain.  Hopefully the OxyContin with oxycodone are helping her..  She is doing well with radiation.  She started radiation therapy yesterday. ? ?She is moving around little bit better. ? ?Her labs show white count 14.7.  Hemoglobin 8.8.  Platelet count is 524,000.  She did get some IV iron yesterday. ? ?Her sodium is 133.  Her BUN is 6 creatinine 0.53.  Calcium 7.1 with an albumin of 2.2.  It may not be a bad idea to give her some calcium.  Her albumin is quite low at 2.2.  Hopefully this will improve with nutrition. ? ?She has had no diarrhea.  She has had no nausea or vomiting.  There is no cough or shortness of breath. ? ?She had an EKG yesterday.  This did show sinus tachycardia with some PVCs and PACs.  There is no atrial fibrillation. ? ?Her vital signs show a temperature of 100.2.  Pulse 106.  Blood pressure 142/70.  Her lungs are clear bilaterally.  Cardiac exam is tachycardic but regular.  She does have occasional extra beat.  Abdomen is soft.  Bowel sounds are present.  There is no fluid wave.  There is no abdominal mass.  There is no palpable liver or spleen tip.  Extremity shows no clubbing, cyanosis or edema. ? ?Again, she is making some progress.  Hopefully the OxyContin is helping with long-term pain issues. ? ?She will continue her radiation therapy. ? ?We will restart chemotherapy next week. ? ?Hopefully, she will be able to go home soon as she seems to be doing okay.  I will give her some IV calcium to see if this may help get her calcium levels up a little bit.  Her nutritional state is not that great given her albumin of 2.2. ? ?Lattie Haw, MD ? ?Romans 8:28 ? ? ?

## 2021-09-19 NOTE — Care Management Important Message (Signed)
Important Message ? ?Patient Details IM Letter placed in Patients room. ?Name: Cheryl Singh ?MRN: [id number] ?Date of Birth: 1951-05-02 ? ? ?Medicare Important Message Given:  Yes ? ? ? ? ?Kerin Salen ?09/19/2021, 10:06 AM ?

## 2021-09-19 NOTE — Progress Notes (Signed)
Occupational Therapy Treatment ?Patient Details ?Name: Cheryl Singh ?MRN: [id number] ?DOB: Jan 22, 1951 ?Today's Date: 09/19/2021 ? ? ?History of present illness Pt admitted from home with SOB, tremors, near syncope and macrocytic anemia (7.8 hgb on admit - now 7.4).  Pt with hx of metastatic bladder CA ?  ?OT comments ? Patient has reached all OT goals at this time. Patient was able to maintain O2 saturation on RA during therapeutic activity with noted to drop to 85% on RA with ability to carryover deep breathing strategies to bring back up to 92% on RA within 30 seconds. Patient was able to verbalize education on needing to take it slow and deep breath when feeling SOB. Patient recommendations below. OT signing off at this time.   ? ?Recommendations for follow up therapy are one component of a multi-disciplinary discharge planning process, led by the attending physician.  Recommendations may be updated based on patient status, additional functional criteria and insurance authorization. ?   ?Follow Up Recommendations ? No OT follow up  ?  ?Assistance Recommended at Discharge Set up Supervision/Assistance  ?Patient can return home with the following ? Assistance with cooking/housework;Direct supervision/assist for financial management;Assist for transportation;Help with stairs or ramp for entrance;Direct supervision/assist for medications management ?  ?Equipment Recommendations ?    ?  ?Recommendations for Other Services   ? ?  ?Precautions / Restrictions Precautions ?Precautions: Fall ?Precaution Comments: monitor HR and O2 ?Restrictions ?Weight Bearing Restrictions: No  ? ? ?  ? ?Mobility Bed Mobility ?Overal bed mobility: Modified Independent ?  ?  ?  ?  ?  ?  ?General bed mobility comments: no physical assist, HOB up, used rail ?  ? ?Transfers ?  ?  ?  ?  ?  ?  ?  ?  ?  ?  ?  ?  ?Balance Overall balance assessment: No apparent balance deficits (not formally assessed) ?  ?  ?  ?  ?  ?  ?  ?  ?  ?  ?  ?  ?  ?  ?   ?  ?  ?  ?   ? ?ADL either performed or assessed with clinical judgement  ? ?ADL   ?Eating/Feeding: Modified independent ?  ?Grooming: Dance movement psychotherapist;Modified independent ?  ?Upper Body Bathing: Modified independent ?  ?Lower Body Bathing: Supervison/ safety ?  ?Upper Body Dressing : Modified independent ?  ?Lower Body Dressing: Supervision/safety;Sit to/from stand ?  ?Toilet Transfer: Supervision/safety ?  ?Toileting- Clothing Manipulation and Hygiene: Supervision/safety ?  ?  ?  ?Functional mobility during ADLs: Supervision/safety;Rolling walker (2 wheels) ?General ADL Comments: patients HR was noted to reach 127 bpm during SOB episode while movement in hallway with RW per patient request. patient was noted to have O2 drop to ?  ? ?Extremity/Trunk Assessment   ?  ?  ?  ?  ?  ? ?Vision   ?  ?  ?Perception   ?  ?Praxis   ?  ? ?Cognition Arousal/Alertness: Awake/alert ?Behavior During Therapy: [facility] for tasks assessed/performed ?Overall Cognitive Status: Within Functional Limits for tasks assessed ?  ?  ?  ?  ?  ?  ?  ?  ?  ?  ?  ?  ?  ?  ?  ?  ?General Comments: motivated to participate in all movement ?  ?  ?   ?Exercises   ? ?  ?Shoulder Instructions   ? ? ?  ?General Comments    ? ? ?  Pertinent Vitals/ Pain       Pain Assessment ?Pain Assessment: No/denies pain ? ?Home Living   ?  ?  ?  ?  ?  ?  ?  ?  ?  ?  ?  ?  ?  ?  ?  ?  ?  ?  ? ?  ?Prior Functioning/Environment    ?  ?  ?  ?   ? ?Frequency ? Min 2X/week  ? ? ? ? ?  ?Progress Toward Goals ? ?OT Goals(current goals can now be found in the care plan section) ? Progress towards OT goals: Goals met/education completed, patient discharged from OT ? ?Acute Rehab OT Goals ?OT Goal Formulation: All assessment and education complete, DC therapy  ?Plan Discharge plan remains appropriate   ? ?Co-evaluation ? ? ?   ?  ?  ?  ?  ? ?  ?AM-PAC OT "6 Clicks" Daily Activity     ?Outcome Measure ? ? Help from another person eating meals?: None ?Help from another person taking care  of personal grooming?: None ?Help from another person toileting, which includes using toliet, bedpan, or urinal?: A Little (SUP) ?Help from another person bathing (including washing, rinsing, drying)?: None ?Help from another person to put on and taking off regular upper body clothing?: None ?Help from another person to put on and taking off regular lower body clothing?: None ?6 Click Score: 23 ? ?  ?End of Session Equipment Utilized During Treatment: Gait belt;Rolling walker (2 wheels) ? ?OT Visit Diagnosis: Unsteadiness on feet (R26.81);Muscle weakness (generalized) (M62.81) ?  ?Activity Tolerance Patient tolerated treatment well ?  ?Patient Left with call bell/phone within reach;in bed ?  ?Nurse Communication Mobility status ?  ? ?   ? ?Time: 3744-5146 ?OT Time Calculation (min): 18 min ? ?Charges: OT General Charges ?$OT Visit: 1 Visit ?OT Treatments ?$Therapeutic Activity: 8-22 mins ? ?Koby Hartfield OTR/L, MS ?Acute Rehabilitation Department ?Office# [phone] ?Pager# [phone] ? ? ?Feliz Beam Nataleigh Griffin ?09/19/2021, 4:19 PM ?

## 2021-09-20 ENCOUNTER — Inpatient Hospital Stay (HOSPITAL_COMMUNITY): Payer: Medicare PPO

## 2021-09-20 ENCOUNTER — Ambulatory Visit
Admit: 2021-09-20 | Discharge: 2021-09-20 | Disposition: A | Discharge status: Home / Self Care | Payer: Medicare PPO | Attending: Radiation Oncology | Admitting: Radiation Oncology

## 2021-09-20 DIAGNOSIS — I951 Orthostatic hypotension: Secondary | ICD-10-CM | POA: Diagnosis not present

## 2021-09-20 LAB — CBC WITH DIFFERENTIAL/PLATELET
Abs Immature Granulocytes: 1.98 10*3/uL — ABNORMAL HIGH (ref 0.00–0.07)
Basophils Absolute: 0.1 10*3/uL (ref 0.0–0.1)
Basophils Relative: 1 %
Eosinophils Absolute: 0.1 10*3/uL (ref 0.0–0.5)
Eosinophils Relative: 1 %
HCT: 29.2 % — ABNORMAL LOW (ref 36.0–46.0)
Hemoglobin: 9.3 g/dL — ABNORMAL LOW (ref 12.0–15.0)
Immature Granulocytes: 17 %
Lymphocytes Relative: 6 %
Lymphs Abs: 0.8 10*3/uL (ref 0.7–4.0)
MCH: 31.2 pg (ref 26.0–34.0)
MCHC: 31.8 g/dL (ref 30.0–36.0)
MCV: 98 fL (ref 80.0–100.0)
Monocytes Absolute: 1.9 10*3/uL — ABNORMAL HIGH (ref 0.1–1.0)
Monocytes Relative: 16 %
Neutro Abs: 6.9 10*3/uL (ref 1.7–7.7)
Neutrophils Relative %: 59 %
Platelets: 472 10*3/uL — ABNORMAL HIGH (ref 150–400)
RBC: 2.98 MIL/uL — ABNORMAL LOW (ref 3.87–5.11)
RDW: 14.2 % (ref 11.5–15.5)
WBC: 11.7 10*3/uL — ABNORMAL HIGH (ref 4.0–10.5)
nRBC: 0.2 % (ref 0.0–0.2)

## 2021-09-20 LAB — CULTURE, BLOOD (ROUTINE X 2)
Culture: NO GROWTH
Culture: NO GROWTH
Special Requests: ADEQUATE

## 2021-09-20 LAB — URINE CULTURE

## 2021-09-20 LAB — COMPREHENSIVE METABOLIC PANEL
ALT: 15 U/L (ref 0–44)
AST: 21 U/L (ref 15–41)
Albumin: 2.4 g/dL — ABNORMAL LOW (ref 3.5–5.0)
Alkaline Phosphatase: 191 U/L — ABNORMAL HIGH (ref 38–126)
Anion gap: 7 (ref 5–15)
BUN: 6 mg/dL — ABNORMAL LOW (ref 8–23)
CO2: 27 mmol/L (ref 22–32)
Calcium: 7.2 mg/dL — ABNORMAL LOW (ref 8.9–10.3)
Chloride: 100 mmol/L (ref 98–111)
Creatinine, Ser: 0.55 mg/dL (ref 0.44–1.00)
GFR, Estimated: >60 mL/min (ref >60)
Glucose, Bld: 101 mg/dL — ABNORMAL HIGH (ref 70–99)
Potassium: 3.5 mmol/L (ref 3.5–5.1)
Sodium: 134 mmol/L — ABNORMAL LOW (ref 135–145)
Total Bilirubin: 0.2 mg/dL — ABNORMAL LOW (ref 0.3–1.2)
Total Protein: 5.8 g/dL — ABNORMAL LOW (ref 6.5–8.1)

## 2021-09-20 LAB — MAGNESIUM: Magnesium: 1.5 mg/dL — ABNORMAL LOW (ref 1.7–2.4)

## 2021-09-20 MED ORDER — MAGNESIUM SULFATE 4 GM/100ML IV SOLN
4.0000 g | Freq: Once | INTRAVENOUS | Status: AC
  Administered 2021-09-20: 4 g via INTRAVENOUS
  Filled 2021-09-20: qty 100

## 2021-09-20 MED ORDER — SODIUM CHLORIDE 0.9 % IV SOLN
4.0000 g | Freq: Once | INTRAVENOUS | Status: AC
  Administered 2021-09-20: 4 g via INTRAVENOUS
  Filled 2021-09-20: qty 40

## 2021-09-20 NOTE — Progress Notes (Signed)
?PROGRESS NOTE ? ? ? ?Cheryl Singh  QAS:[id number] DOB: 02/24/1951 DOA: 09/14/2021 ?PCP: Glenis Smoker, MD  ? ?Brief Narrative: 71 year old F with PMH of stage IV bladder cancer s/p TURBT last month, anemia, HTN, HLD and GERD presenting with generalized weakness, chills, shaking, lightheadedness, shortness of breath and abdominal bloating/pain, and admitted for near syncope, orthostatic hypotension and possible UTI.  Patient had pain medication increased recently.  UA concerning for UTI but she denies UTI symptoms.  Urology consulted and recommended empiric antibiotics.  She was started on IV ceftriaxone and IV fluid and admitted. ?Patient ambulated with desaturation down to 84% on room air.  She was started on inhalers and nebulizers.  She has no history of asthma COPD or CHF but she is smoked her lifelong.  She received 1 unit of blood transfusion for a hemoglobin of 7.4 with no evidence of active bleeding hematuria melena or hematochezia.  Patient was initially treated with Rocephin for possible UTI in the setting of bladder cancer.  However she had fever and leukocytosis which was worsening.  At that point meropenem was started.  Urine culture from the 18th and 19th showed mixed growth.  A catheterized sample was sent on 09/18/2021 which is in process. ?Blood cultures no growth, COVID and influenza negative ? ?Assessment & Plan: ?  ?Principal Problem: ?  Orthostatic hypotension ?Active Problems: ?  UTI (urinary tract infection) ?  Symptomatic anemia ?  Acute respiratory failure with hypoxia ([place]) ?  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  Cancer related pain ?  Premature atrial contraction ?  Hypokalemia and hypomagnesemia ?  Hypocalcemia ?  HTN (hypertension) ?  HLD (hyperlipidemia) ?  GERD (gastroesophageal reflux disease) ?  Generalized weakness ?  Closed fracture of rib on left side ? ?Orthostatic hypotension on admission has been resolved.  She received IV fluids and 1 unit of packed RBC.   Morphine was stopped. ? ?Urinary tract infection UA consistent with UTI.  She is status post TURBT about 2 months ago.  Urology recommended to continue antibiotics. ?Was on Rocephin changed to meropenem since patient continued to have fever and leukocytosis which was worsening. ?Catheterized urine sample sent on 09/18/2021 still showing multiple species ? ?Symptomatic anemia with hemoglobin down to 7.4.  No evidence of active bleeding.  Microscopic hematuria noted.  She received 1 unit of packed RBC with improvement in hemoglobin and her symptoms. ?Hemoglobin stable at 9.3. ? ?Acute hypoxic respiratory failure CT a chest no evidence of PE or pneumonia.  Saturation 84% on room air placed on 2 L.  Continue to encourage her to use incentive spirometer which she has been using faithfully.  Continue nebulizer and inhalers. ?Check ambulatory O2 saturation. ? ?Hypokalemia/hypomagnesemia/hypocalcemia-calcium 3.6 magnesium 1.5 calcium 7.1.  Corrected calcium is 8.5.  Replete magnesium. ? ?Metastatic bladder cancer followed by radiation oncology and oncology.  Started on OxyContin with Percocet for pain control.  Started XRT 09/18/2021. ? ?Hypertension-on losartan 100 mg daily. ?changed Norvasc to Cardizem due to persistent tachycardia. ? ?Left rib closed fracture possibly pathologic from metastatic lesion ? ? ?Estimated body mass index is 28.23 kg/m? as calculated from the following: ?  Height as of this encounter: 5' 4.49" (1.638 m). ?  Weight as of this encounter: 75.8 kg. ? ?DVT prophylaxis: Lovenox ?Code Status: Full code  ?family Communication: None at bedside  ?disposition Plan:  Status is: Inpatient ?Remains inpatient appropriate because: On IV antibiotics ?  ?Consultants:  ?Oncology and radiation oncology ? ?Procedures:  None ?Antimicrobials: Meropenem ? ?Subjective: ?Patient is resting in bed she denies any dysuria diarrhea abdominal pain nausea vomiting she feels a little better than yesterday. ?Objective: ?Vitals:  ?  09/20/21 0421 09/20/21 0421 09/20/21 7322 09/20/21 1028  ?BP: (!) 153/82 (!) 153/82 (!) 156/80 (!) 143/80  ?Pulse: 87 69 (!) 115 (!) 107  ?Resp: '18 18  18  '$ ?Temp: 98.4 ?F (36.9 ?C) 98.4 ?F (36.9 ?C)  98.2 ?F (36.8 ?C)  ?TempSrc: Oral Oral  Oral  ?SpO2: 90% (!) 88%  99%  ?Weight:      ?Height:      ? ? ?Intake/Output Summary (Last 24 hours) at 09/20/2021 1357 ?Last data filed at 09/20/2021 1212 ?Gross per 24 hour  ?Intake 360 ml  ?Output 2900 ml  ?Net -2540 ml  ? ? ?Filed Weights  ? 09/17/21 1548  ?Weight: 75.8 kg  ? ? ?Examination: ? ?General exam: Appears in no acute distress ?Respiratory system: Clear to auscultation. Respiratory effort normal. ?Cardiovascular system: S1 & S2 heard, RRR. No JVD, murmurs, rubs, gallops or clicks. No pedal edema.  Tachycardic ?Gastrointestinal system: Abdomen is nondistended, soft and nontender. No organomegaly or masses felt. Normal bowel sounds heard. ?Central nervous system: Alert and oriented. No focal neurological deficits.  Moves all extremities ?Extremities: No edema ?Skin: No rashes, lesions or ulcers ?Psychiatry: Judgement and insight appear normal. Mood & affect appropriate.  ? ? ? ?Data Reviewed: I have personally reviewed following labs and imaging studies ? ?CBC: ?Recent Labs  ?Lab 09/15/21 ?0020 09/15/21 ?0115 09/16/21 ?0243 09/16/21 ?1911 09/18/21 ?0959 09/19/21 ?0604 09/20/21 ?0430  ?WBC 6.8  --  9.6  --  14.9* 14.7* 11.7*  ?NEUTROABS 3.1  --   --   --  10.7* 11.9* 6.9  ?HGB 8.5*   < > 7.4*  7.5* 9.4* 9.3* 8.8* 9.3*  ?HCT 27.2*   < > 23.9*  24.2* 30.1* 29.2* 27.4* 29.2*  ?MCV 101.5*  --  101.7*  --  98.0 97.9 98.0  ?PLT 528*  --  605*  --  591* 524* 472*  ? < > = values in this interval not displayed.  ? ? ?Basic Metabolic Panel: ?Recent Labs  ?Lab 09/15/21 ?0020 09/15/21 ?0115 09/16/21 ?0254 09/18/21 ?2706 09/19/21 ?2376 09/20/21 ?0430  ?NA 134* 134* 142 136 133* 134*  ?K 3.9 4.1 3.7 3.5 3.6 3.5  ?CL 101 100 109 100 97* 100  ?CO2 20*  --  '24 29 28 27  '$ ?GLUCOSE 123*  116* 125* 128* 114* 101*  ?BUN '17 15 15 '$ 6* 6* 6*  ?CREATININE 0.85 0.90 0.61 0.52 0.53 0.55  ?CALCIUM 7.2*  --  8.2* 7.4* 7.1* 7.2*  ?MG  --   --   --  1.6* 1.6* 1.5*  ?PHOS  --   --   --  2.8  --   --   ? ? ?GFR: ?Estimated Creatinine Clearance: 66.8 mL/min (by C-G formula based on SCr of 0.55 mg/dL). ?Liver Function Tests: ?Recent Labs  ?Lab 09/15/21 ?0020 09/16/21 ?0243 09/18/21 ?2831 09/19/21 ?0604 09/20/21 ?0430  ?AST '23 20 21 18 21  '$ ?ALT '23 19 17 14 15  '$ ?ALKPHOS 174* 161* 179* 173* 191*  ?BILITOT 0.3 <0.1* 0.4 0.2* 0.2*  ?PROT 5.6* 5.6* 5.7* 5.5* 5.8*  ?ALBUMIN 2.3* 2.0* 2.3* 2.2* 2.4*  ? ? ?No results for input(s): LIPASE, AMYLASE in the last 168 hours. ?No results for input(s): AMMONIA in the last 168 hours. ?Coagulation Profile: ?No results for input(s): INR, PROTIME in the  last 168 hours. ?Cardiac Enzymes: ?No results for input(s): CKTOTAL, CKMB, CKMBINDEX, TROPONINI in the last 168 hours. ?BNP (last 3 results) ?No results for input(s): PROBNP in the last 8760 hours. ?HbA1C: ?No results for input(s): HGBA1C in the last 72 hours. ?CBG: ?No results for input(s): GLUCAP in the last 168 hours. ?Lipid Profile: ?No results for input(s): CHOL, HDL, LDLCALC, TRIG, CHOLHDL, LDLDIRECT in the last 72 hours. ?Thyroid Function Tests: ?No results for input(s): TSH, T4TOTAL, FREET4, T3FREE, THYROIDAB in the last 72 hours. ?Anemia Panel: ?No results for input(s): VITAMINB12, FOLATE, FERRITIN, TIBC, IRON, RETICCTPCT in the last 72 hours. ?Sepsis Labs: ?Recent Labs  ?Lab 09/15/21 ?0100  ?LATICACIDVEN 1.7  ? ? ? ?Recent Results (from the past 240 hour(s))  ?Resp Panel by RT-PCR (Flu A&B, Covid) Nasopharyngeal Swab     Status: None  ? Collection Time: 09/15/21 12:11 AM  ? Specimen: Nasopharyngeal Swab; Nasopharyngeal(NP) swabs in vial transport medium  ?Result Value Ref Range Status  ? SARS Coronavirus 2 by RT PCR NEGATIVE NEGATIVE Final  ?  Comment: (NOTE) ?SARS-CoV-2 target nucleic acids are NOT DETECTED. ? ?The SARS-CoV-2  RNA is generally detectable in upper respiratory ?specimens during the acute phase of infection. The lowest ?concentration of SARS-CoV-2 viral copies this assay can detect is ?138 copies/mL. A negative r

## 2021-09-20 NOTE — Progress Notes (Signed)
Physical Therapy Treatment ?Patient Details ?Name: Cheryl Singh ?MRN: [id number] ?DOB: 1951/03/10 ?Today's Date: 09/20/2021 ? ? ?History of Present Illness Pt admitted from home with SOB, tremors, near syncope and macrocytic anemia (7.8 hgb on admit - now 7.4).  Pt with hx of metastatic bladder CA ? ?  ?PT Comments  ? ? Pt AxO x 3 very motivated and pleasant.  Eager to get home.  Assisted OOB to amb to bathroom then in hallway.  General transfer comment: toileted self including self peri care and don/doff lounge pants.  Tolerated a functional distance in hallway.    ?Recommendations for follow up therapy are one component of a multi-disciplinary discharge planning process, led by the attending physician.  Recommendations may be updated based on patient status, additional functional criteria and insurance authorization. ? ?Follow Up Recommendations ? No PT follow up ?  ?  ?Assistance Recommended at Discharge Intermittent Supervision/Assistance  ?Patient can return home with the following Assistance with cooking/housework;Assist for transportation;Help with stairs or ramp for entrance;A little help with bathing/dressing/bathroom ?  ?Equipment Recommendations ?    ?  ?Recommendations for Other Services   ? ? ?  ?Precautions / Restrictions Precautions ?Precautions: Fall ?Precaution Comments: monitor HR and O2 ?Restrictions ?Weight Bearing Restrictions: No  ?  ? ?Mobility ? Bed Mobility ?Overal bed mobility: Modified Independent ?  ?  ?  ?  ?  ?  ?General bed mobility comments: no physical assist, HOB up, used rail ?  ? ?Transfers ?Overall transfer level: Needs assistance ?Equipment used: Rolling walker (2 wheels) ?Transfers: Sit to/from Stand ?Sit to Stand: Supervision ?  ?  ?  ?  ?  ?General transfer comment: toileted self including self peri care and don/doff lounge pants ?  ? ?Ambulation/Gait ?Ambulation/Gait assistance: Supervision ?Gait Distance (Feet): 225 Feet ?Assistive device: Rolling walker (2 wheels) ?Gait  Pattern/deviations: Step-through pattern ?Gait velocity: WFL ?  ?  ?General transfer comment: toileted self including self peri care and don/doff lounge pants ? ? ?Stairs ?  ?  ?  ?  ?  ? ? ?Wheelchair Mobility ?  ? ?Modified Rankin (Stroke Patients Only) ?  ? ? ?  ?Balance   ?  ?  ?  ?  ?  ?  ?  ?  ?  ?  ?  ?  ?  ?  ?  ?  ?  ?  ?  ? ?  ?Cognition Arousal/Alertness: Awake/alert ?Behavior During Therapy: [facility] for tasks assessed/performed ?Overall Cognitive Status: Within Functional Limits for tasks assessed ?  ?  ?  ?  ?  ?  ?  ?  ?  ?  ?  ?  ?  ?  ?  ?  ?General Comments: AxO x 3 very motivated ?  ?  ? ?  ?Exercises   ? ?  ?General Comments   ?  ?  ? ?Pertinent Vitals/Pain Pain Assessment ?Pain Assessment: Faces ?Faces Pain Scale: Hurts a little bit ?Pain Location: chronic back ?Pain Descriptors / Indicators: Aching ?Pain Intervention(s): Monitored during session  ? ? ?Home Living   ?  ?  ?  ?  ?  ?  ?  ?  ?  ?   ?  ?Prior Function    ?  ?  ?   ? ?PT Goals (current goals can now be found in the care plan section) Progress towards PT goals: Progressing toward goals ? ?  ?Frequency ? ? ? Min 3X/week ? ? ? ?  ?  PT Plan Current plan remains appropriate  ? ? ?Co-evaluation   ?  ?  ?  ?  ? ?  ?AM-PAC PT "6 Clicks" Mobility   ?Outcome Measure ? Help needed turning from your back to your side while in a flat bed without using bedrails?: None ?Help needed moving from lying on your back to sitting on the side of a flat bed without using bedrails?: None ?Help needed moving to and from a bed to a chair (including a wheelchair)?: A Little ?Help needed standing up from a chair using your arms (e.g., wheelchair or bedside chair)?: A Little ?Help needed to walk in hospital room?: A Little ?Help needed climbing 3-5 steps with a railing? : A Little ?6 Click Score: 20 ? ?  ?End of Session Equipment Utilized During Treatment: Gait belt ?Activity Tolerance: Patient tolerated treatment well ?Patient left: with call bell/phone within  reach;in bed ?Nurse Communication: Mobility status ?PT Visit Diagnosis: Difficulty in walking, not elsewhere classified (R26.2) ?  ? ? ?Time: 1202-1228 ?PT Time Calculation (min) (ACUTE ONLY): 26 min ? ?Charges:  $Gait Training: 8-22 mins ?$Therapeutic Activity: 8-22 mins          ?          ? ?{Hakan Nudelman  PTA ?Acute  Rehabilitation Services ?Pager      [phone] ?Office      [phone] ? ?

## 2021-09-20 NOTE — Progress Notes (Signed)
Ms. Call is doing okay.  She had little bit of abdominal pain last night.  There is no nausea or vomiting.  She had no diarrhea. ? ?Her back pain seems to be doing fairly well right now.  She does seem to be more functional.  I think she is getting out of bed more. ? ?She is not to happy with the food in the hospital.  I told her that she can always have people bring food in for her to make her eat a little bit better. ? ?She has had no cough or shortness of breath.  She has had no problems with her urine.  She has had no bleeding. ? ?Her labs show white cell count 11.7.  Hemoglobin 9.3.  Platelet count 452,000.  Her sodium was 134.  Potassium 3.5.  BUN 6 creatinine 0.55.  Calcium 7.2 with an albumin of 2.4.  I think we gave her some calcium yesterday.  She may benefit from another dose today. ? ?Her vital signs are temperature 98.4.  Pulse 115.  Blood pressure 156/88.  Oxygen saturation was 88%.  Her head neck exam showed no oral lesions.  There is no adenopathy in the neck.  Lungs were clear bilaterally.  She had decent breath sounds bilaterally.  Cardiac exam tachycardic but regular.  She has some occasional extra beats.  Abdomen was soft.  Bowel sounds are decreased.  She had no guarding or rebound tenderness.  There is no palpable hepatomegaly.  Extremities shows no clubbing, cyanosis or edema.  Neurological exam is nonfocal. ? ?She will continue her radiation therapy.  Again I am not sure as to why her oxygen saturation is on the lower side.  She does not appear to have any pulmonary symptoms.  She does not appear to be dyspneic. ? ?I will give her another dose of IV calcium.  I think this will be somewhat helpful. ? ?Hopefully, she might be able to continue radiation therapy as an outpatient at some point.  I am not sure how many more treatments she has left. ? ?I do appreciate the incredible care that she is getting from all the staff up on [address]. ? ?Lattie Haw, MD ?Lurena Joiner 18:27 ?

## 2021-09-21 ENCOUNTER — Ambulatory Visit
Admit: 2021-09-21 | Discharge: 2021-09-21 | Disposition: A | Discharge status: Home / Self Care | Payer: Medicare PPO | Attending: Radiation Oncology | Admitting: Radiation Oncology

## 2021-09-21 DIAGNOSIS — I951 Orthostatic hypotension: Secondary | ICD-10-CM | POA: Diagnosis not present

## 2021-09-21 LAB — CBC WITH DIFFERENTIAL/PLATELET
Abs Immature Granulocytes: 1.04 10*3/uL — ABNORMAL HIGH (ref 0.00–0.07)
Basophils Absolute: 0.1 10*3/uL (ref 0.0–0.1)
Basophils Relative: 1 %
Eosinophils Absolute: 0.1 10*3/uL (ref 0.0–0.5)
Eosinophils Relative: 1 %
HCT: 29.4 % — ABNORMAL LOW (ref 36.0–46.0)
Hemoglobin: 9.4 g/dL — ABNORMAL LOW (ref 12.0–15.0)
Immature Granulocytes: 11 %
Lymphocytes Relative: 8 %
Lymphs Abs: 0.7 10*3/uL (ref 0.7–4.0)
MCH: 31.5 pg (ref 26.0–34.0)
MCHC: 32 g/dL (ref 30.0–36.0)
MCV: 98.7 fL (ref 80.0–100.0)
Monocytes Absolute: 1.6 10*3/uL — ABNORMAL HIGH (ref 0.1–1.0)
Monocytes Relative: 16 %
Neutro Abs: 6.3 10*3/uL (ref 1.7–7.7)
Neutrophils Relative %: 63 %
Platelets: 419 10*3/uL — ABNORMAL HIGH (ref 150–400)
RBC: 2.98 MIL/uL — ABNORMAL LOW (ref 3.87–5.11)
RDW: 14.2 % (ref 11.5–15.5)
WBC: 9.9 10*3/uL (ref 4.0–10.5)
nRBC: 0 % (ref 0.0–0.2)

## 2021-09-21 LAB — COMPREHENSIVE METABOLIC PANEL
ALT: 17 U/L (ref 0–44)
AST: 23 U/L (ref 15–41)
Albumin: 2.4 g/dL — ABNORMAL LOW (ref 3.5–5.0)
Alkaline Phosphatase: 191 U/L — ABNORMAL HIGH (ref 38–126)
Anion gap: 5 (ref 5–15)
BUN: 6 mg/dL — ABNORMAL LOW (ref 8–23)
CO2: 29 mmol/L (ref 22–32)
Calcium: 7.3 mg/dL — ABNORMAL LOW (ref 8.9–10.3)
Chloride: 99 mmol/L (ref 98–111)
Creatinine, Ser: 0.46 mg/dL (ref 0.44–1.00)
GFR, Estimated: >60 mL/min (ref >60)
Glucose, Bld: 97 mg/dL (ref 70–99)
Potassium: 3.9 mmol/L (ref 3.5–5.1)
Sodium: 133 mmol/L — ABNORMAL LOW (ref 135–145)
Total Bilirubin: 0.2 mg/dL — ABNORMAL LOW (ref 0.3–1.2)
Total Protein: 6 g/dL — ABNORMAL LOW (ref 6.5–8.1)

## 2021-09-21 LAB — MAGNESIUM: Magnesium: 2.2 mg/dL (ref 1.7–2.4)

## 2021-09-21 MED ORDER — SODIUM CHLORIDE 0.9 % IV SOLN
4.0000 g | Freq: Once | INTRAVENOUS | Status: AC
  Administered 2021-09-21: 4 g via INTRAVENOUS
  Filled 2021-09-21: qty 40

## 2021-09-21 NOTE — Progress Notes (Signed)
Mobility Specialist - Progress Note ? ? 09/21/21 1116  ?Mobility  ?Activity Ambulated with assistance in hallway;Ambulated with assistance to bathroom  ?Level of Assistance Contact guard assist, steadying assist  ?Assistive Device Front wheel walker  ?Distance Ambulated (ft) 310 ft  ?Activity Response Tolerated well  ?$Mobility charge 1 Mobility  ? ? ?Pre-mobility: 74 HR ?During mobility: 117 HR ?Post-mobility: 85 HR ? ?Pt eager to mobilize this morning. Required RW to ambulate to bathroom prior to entering hallway. Pt ambulated 310 ft in hallway, reported 3/10 pain in back secondary to radiation. 4 standing rest breaks required during session as pt experienced SOB and back pain. Pt returned to room after session and was left sitting up in bed with all necessities in reach. RN informed of session. ? ?Ananiah Maciolek S. ?Mobility Specialist ?Acute Rehabilitation Services ?Phone: [phone] ?09/21/21, 11:19 AM ? ? ? ?

## 2021-09-21 NOTE — Progress Notes (Signed)
?PROGRESS NOTE ? ? ? ?Cheryl Singh  JOI:[id number] DOB: 04/15/1951 DOA: 09/14/2021 ?PCP: Glenis Smoker, MD  ? ?Brief Narrative: 71 year old F with PMH of stage IV bladder cancer s/p TURBT last month, anemia, HTN, HLD and GERD presenting with generalized weakness, chills, shaking, lightheadedness, shortness of breath and abdominal bloating/pain, and admitted for near syncope, orthostatic hypotension and possible UTI.  Patient had pain medication increased recently.  UA concerning for UTI but she denies UTI symptoms.  Urology consulted and recommended empiric antibiotics.  She was started on IV ceftriaxone and IV fluid and admitted. ?Patient ambulated with desaturation down to 84% on room air.  She was started on inhalers and nebulizers.  She has no history of asthma COPD or CHF but she is smoked her lifelong.  She received 1 unit of blood transfusion for a hemoglobin of 7.4 with no evidence of active bleeding hematuria melena or hematochezia.  Patient was initially treated with Rocephin for possible UTI in the setting of bladder cancer.  However she had fever and leukocytosis which was worsening.  At that point meropenem was started.  Urine culture from the 18th and 19th showed mixed growth.  A catheterized sample was sent on 09/18/2021 which is in process. ?Blood cultures no growth, COVID and influenza negative ? ?Assessment & Plan: ?  ?Principal Problem: ?  Orthostatic hypotension ?Active Problems: ?  UTI (urinary tract infection) ?  Symptomatic anemia ?  Acute respiratory failure with hypoxia ([place]) ?  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  Cancer related pain ?  Premature atrial contraction ?  Hypokalemia and hypomagnesemia ?  Hypocalcemia ?  HTN (hypertension) ?  HLD (hyperlipidemia) ?  GERD (gastroesophageal reflux disease) ?  Generalized weakness ?  Closed fracture of rib on left side ? ?Orthostatic hypotension on admission has been resolved.  She received IV fluids and 1 unit of packed RBC.   Morphine was stopped. ? ?Urinary tract infection UA consistent with UTI.  She is status post TURBT about 2 months ago.  Urology recommended to continue antibiotics. ?Was on Rocephin changed to meropenem since patient continued to have fever and leukocytosis which was worsening. ?Catheterized urine sample sent on 09/18/2021 still showing multiple species ? ?Symptomatic anemia with hemoglobin down to 7.4.  No evidence of active bleeding.  Microscopic hematuria noted.  She received 1 unit of packed RBC with improvement in hemoglobin and her symptoms. ?Hemoglobin stable at 9.3. ? ?Acute hypoxic respiratory failure CT a chest no evidence of PE or pneumonia.  Saturation 84% on room air placed on 2 L.  Continue to encourage her to use incentive spirometer which she has been using faithfully.  Continue nebulizer and inhalers. ?Check ambulatory O2 saturation. ?Chest x-ray 09/20/2021 no active lung disease. ? ?Hypokalemia/hypomagnesemia/hypocalcemia-being repleted.  Potassium 3.9 calcium 7.3 magnesium 2.2.  ? ?Metastatic bladder cancer followed by radiation oncology and oncology.  Started on OxyContin with Percocet for pain control.  Started XRT 09/18/2021. ? ?Hypertension-on losartan 100 mg daily. ?changed Norvasc to Cardizem due to persistent tachycardia. ? ?Left rib closed fracture possibly pathologic from metastatic lesion ? ? ?Estimated body mass index is 28.23 kg/m? as calculated from the following: ?  Height as of this encounter: 5' 4.49" (1.638 m). ?  Weight as of this encounter: 75.8 kg. ? ?DVT prophylaxis: Lovenox ?Code Status: Full code  ?family Communication: None at bedside  ?disposition Plan:  Status is: Inpatient ?Remains inpatient appropriate because: On IV antibiotics ?  ?Consultants:  ?Oncology and radiation  oncology ? ?Procedures: None ?Antimicrobials: Meropenem ? ?Subjective: ? ?She is sitting up by the side of the bed ?Objective: ?Vitals:  ? 09/20/21 1028 09/20/21 1604 09/20/21 2136 09/21/21 0448  ?BP: (!)  143/80 130/68 (!) 144/69 (!) 142/77  ?Pulse: (!) 107 90 93 (!) 103  ?Resp: '18 18 16 18  '$ ?Temp: 98.2 ?F (36.8 ?C) 98.4 ?F (36.9 ?C) 98.7 ?F (37.1 ?C) 98.3 ?F (36.8 ?C)  ?TempSrc: Oral Oral Oral Oral  ?SpO2: 99% 98% 100% 90%  ?Weight:      ?Height:      ? ? ?Intake/Output Summary (Last 24 hours) at 09/21/2021 0945 ?Last data filed at 09/21/2021 5361 ?Gross per 24 hour  ?Intake 240 ml  ?Output 1400 ml  ?Net -1160 ml  ? ? ?Filed Weights  ? 09/17/21 1548  ?Weight: 75.8 kg  ? ? ?Examination: ? ?General exam: Appears in no acute distress ?Respiratory system: Clear to auscultation. Respiratory effort normal. ?Cardiovascular system: S1 & S2 heard, RRR. No JVD, murmurs, rubs, gallops or clicks. No pedal edema.  Tachycardic ?Gastrointestinal system: Abdomen is nondistended, soft and nontender. No organomegaly or masses felt. Normal bowel sounds heard. ?Central nervous system: Alert and oriented. No focal neurological deficits.  Moves all extremities ?Extremities: No edema ?Skin: No rashes, lesions or ulcers ?Psychiatry: Judgement and insight appear normal. Mood & affect appropriate.  ? ? ? ?Data Reviewed: I have personally reviewed following labs and imaging studies ? ?CBC: ?Recent Labs  ?Lab 09/15/21 ?0020 09/15/21 ?0115 09/16/21 ?0243 09/16/21 ?1911 09/18/21 ?0959 09/19/21 ?0604 09/20/21 ?0430 09/21/21 ?0500  ?WBC 6.8  --  9.6  --  14.9* 14.7* 11.7* 9.9  ?NEUTROABS 3.1  --   --   --  10.7* 11.9* 6.9 6.3  ?HGB 8.5*   < > 7.4*  7.5* 9.4* 9.3* 8.8* 9.3* 9.4*  ?HCT 27.2*   < > 23.9*  24.2* 30.1* 29.2* 27.4* 29.2* 29.4*  ?MCV 101.5*  --  101.7*  --  98.0 97.9 98.0 98.7  ?PLT 528*  --  605*  --  591* 524* 472* 419*  ? < > = values in this interval not displayed.  ? ? ?Basic Metabolic Panel: ?Recent Labs  ?Lab 09/16/21 ?0243 09/18/21 ?0959 09/19/21 ?0604 09/20/21 ?0430 09/21/21 ?0500  ?NA 142 136 133* 134* 133*  ?K 3.7 3.5 3.6 3.5 3.9  ?CL 109 100 97* 100 99  ?CO2 '24 29 28 27 29  '$ ?GLUCOSE 125* 128* 114* 101* 97  ?BUN 15 6* 6* 6* 6*   ?CREATININE 0.61 0.52 0.53 0.55 0.46  ?CALCIUM 8.2* 7.4* 7.1* 7.2* 7.3*  ?MG  --  1.6* 1.6* 1.5* 2.2  ?PHOS  --  2.8  --   --   --   ? ? ?GFR: ?Estimated Creatinine Clearance: 66.8 mL/min (by C-G formula based on SCr of 0.46 mg/dL). ?Liver Function Tests: ?Recent Labs  ?Lab 09/16/21 ?0243 09/18/21 ?0959 09/19/21 ?0604 09/20/21 ?0430 09/21/21 ?0500  ?AST '20 21 18 21 23  '$ ?ALT '19 17 14 15 17  '$ ?ALKPHOS 161* 179* 173* 191* 191*  ?BILITOT <0.1* 0.4 0.2* 0.2* 0.2*  ?PROT 5.6* 5.7* 5.5* 5.8* 6.0*  ?ALBUMIN 2.0* 2.3* 2.2* 2.4* 2.4*  ? ? ?No results for input(s): LIPASE, AMYLASE in the last 168 hours. ?No results for input(s): AMMONIA in the last 168 hours. ?Coagulation Profile: ?No results for input(s): INR, PROTIME in the last 168 hours. ?Cardiac Enzymes: ?No results for input(s): CKTOTAL, CKMB, CKMBINDEX, TROPONINI in the last 168 hours. ?BNP (last  3 results) ?No results for input(s): PROBNP in the last 8760 hours. ?HbA1C: ?No results for input(s): HGBA1C in the last 72 hours. ?CBG: ?No results for input(s): GLUCAP in the last 168 hours. ?Lipid Profile: ?No results for input(s): CHOL, HDL, LDLCALC, TRIG, CHOLHDL, LDLDIRECT in the last 72 hours. ?Thyroid Function Tests: ?No results for input(s): TSH, T4TOTAL, FREET4, T3FREE, THYROIDAB in the last 72 hours. ?Anemia Panel: ?No results for input(s): VITAMINB12, FOLATE, FERRITIN, TIBC, IRON, RETICCTPCT in the last 72 hours. ?Sepsis Labs: ?Recent Labs  ?Lab 09/15/21 ?0100  ?LATICACIDVEN 1.7  ? ? ? ?Recent Results (from the past 240 hour(s))  ?Resp Panel by RT-PCR (Flu A&B, Covid) Nasopharyngeal Swab     Status: None  ? Collection Time: 09/15/21 12:11 AM  ? Specimen: Nasopharyngeal Swab; Nasopharyngeal(NP) swabs in vial transport medium  ?Result Value Ref Range Status  ? SARS Coronavirus 2 by RT PCR NEGATIVE NEGATIVE Final  ?  Comment: (NOTE) ?SARS-CoV-2 target nucleic acids are NOT DETECTED. ? ?The SARS-CoV-2 RNA is generally detectable in upper respiratory ?specimens during  the acute phase of infection. The lowest ?concentration of SARS-CoV-2 viral copies this assay can detect is ?138 copies/mL. A negative result does not preclude SARS-Cov-2 ?infection and should not be used a

## 2021-09-21 NOTE — Progress Notes (Signed)
Cheryl Singh really is doing pretty well.  She was able to have some food that was brought in for her.  This made her happier.  She is able to eat well. ? ?She is doing some physical therapy.  She walked pretty well yesterday. ? ?She is doing radiation therapy.  She is tolerated this pretty well.  She is a little bit stiff. ? ?Pain wise, she seems to be managing pretty well.  She is on OxyContin and oxycodone.  The combination seems to be working for her. ? ?All cultures have been negative. ? ?She has had no problems with bleeding.  There is been no nausea or vomiting.  She has had no obvious change in bowel or bladder habits. ? ?Her labs show sodium 133.  Potassium 3.9.  BUN 6 creatinine 0.46.  Calcium 7.3 with an albumin of 2.4.  Her white cell count is 9.9.  Hemoglobin 9.4.  Platelet count 419,000.  She did get IV iron.  I think this is helping her as her platelet count is starting to come down a little bit. ? ?Her vital signs show temperature 98.3.  Pulse 103.  Blood pressure 142/77.  Her lungs are clear bilaterally.  Cardiac exam slightly tachycardic.  She has occasional extra beat.  She has no murmurs.  Abdomen is soft.  Bowel sounds are present.  There is no guarding or rebound tenderness.  Extremity shows no clubbing, cyanosis or edema.  Neurological exam is nonfocal. ? ?She will continue her radiation therapy today.  She should be able to go home in the morning. ? ?I probably would consider giving her some more calcium.  We given her the bisphosphonate and I want to make sure that her calcium is on the higher side.  I do not see an downside I given her some more IV calcium. ? ?We will have to do chemotherapy next week.  I will have to get this all set up.  She is worried about doing chemotherapy and radiation therapy.  I do not think this should be an issue for [place]. ? ?I know she is gotten fantastic care from all the staff on [facility].  She is incredibly impressed with the care that she is gotten and the professional  approach that the staff has taken.  I am not surprised by this. ? ?Cheryl Haw, MD ? ?1 Chronicles 16:11 ?

## 2021-09-22 DIAGNOSIS — I951 Orthostatic hypotension: Secondary | ICD-10-CM | POA: Diagnosis not present

## 2021-09-22 MED ORDER — DILTIAZEM HCL 30 MG PO TABS: 60.0000 mg | ORAL_TABLET | Freq: Three times a day (TID) | ORAL | 2 refills | Status: DC

## 2021-09-22 MED ORDER — OXYCODONE-ACETAMINOPHEN 5-325 MG PO TABS: 1.0000 | ORAL_TABLET | ORAL | 0 refills | Status: AC | PRN

## 2021-09-22 MED ORDER — OXYCODONE HCL ER 10 MG PO T12A: 10.0000 mg | EXTENDED_RELEASE_TABLET | Freq: Two times a day (BID) | ORAL | 0 refills | Status: DC

## 2021-09-22 NOTE — Discharge Summary (Signed)
Physician Discharge Summary  ?Cheryl Singh WIO:[id number] DOB: [birth year] DOA: 09/14/2021 ? ?PCP: Glenis Smoker, MD ? ?Admit date: 09/14/2021 ?Discharge date: 09/22/2021 ? ?Admitted From: Home ?Disposition: Home ? ?Recommendations for Outpatient Follow-up:  ?Follow up with PCP in 1-2 weeks ?Please obtain BMP/CBC in one week ?Please follow up with Dr. Marin Olp ? ?Home Health: None ?Equipment/Devices: None ?Discharge Condition: Stable ?CODE STATUS: Full code ?Diet recommendation: Cardiac ?Brief/Interim Summary: ? 71 year old F with PMH of stage IV bladder cancer s/p TURBT last month, anemia, HTN, HLD and GERD presenting with generalized weakness, chills, shaking, lightheadedness, shortness of breath and abdominal bloating/pain, and admitted for near syncope, orthostatic hypotension and possible UTI.  Patient had pain medication increased recently.  UA concerning for UTI but she denies UTI symptoms.  Urology consulted and recommended empiric antibiotics.  She was started on IV ceftriaxone and IV fluid and admitted. ?Patient ambulated with desaturation down to 84% on room air.  She was started on inhalers and nebulizers.  She has no history of asthma COPD or CHF but she is smoked her lifelong.  She received 1 unit of blood transfusion for a hemoglobin of 7.4 with no evidence of active bleeding hematuria melena or hematochezia.  Patient was initially treated with Rocephin for possible UTI in the setting of bladder cancer.  However she had fever and leukocytosis which was worsening.  At that point meropenem was started.  Urine culture from the 18th and 19th showed mixed growth.  A catheterized sample was sent on 09/18/2021 which also showed mixed growth. ?Blood cultures no growth, COVID and influenza negative ? ?Discharge Diagnoses:  ?Principal Problem: ?  Orthostatic hypotension ?Active Problems: ?  UTI (urinary tract infection) ?  Symptomatic anemia ?  Acute respiratory failure with hypoxia ([place]) ?  Bladder cancer  metastasized to bone [facility]) ?  Cancer related pain ?  Premature atrial contraction ?  Hypokalemia and hypomagnesemia ?  Hypocalcemia ?  HTN (hypertension) ?  HLD (hyperlipidemia) ?  GERD (gastroesophageal reflux disease) ?  Generalized weakness ?  Closed fracture of rib on left side ? ? ? Orthostatic hypotension on admission has been resolved.  She received IV fluids and 1 unit of packed RBC. ?  ?Urinary tract infection UA consistent with UTI.  She is status post TURBT about 2 months ago.  Urology recommended to continue antibiotics. ?Was on Rocephin changed to meropenem since patient continued to have fever and leukocytosis which was worsening. ?Catheterized urine sample sent on 09/18/2021 still showing multiple species ?She got 4 doses of meropenem. ?  ?Symptomatic anemia with hemoglobin down to 7.4.  No evidence of active bleeding.  Microscopic hematuria noted.  She received 1 unit of packed RBC with improvement in hemoglobin and her symptoms. ?Hemoglobin stable at 9.3. ?  ?Acute hypoxic respiratory failure CT a chest no evidence of PE or pneumonia.  Saturation 84% on room air placed on 2 L.  Continue to encourage her to use incentive spirometer which she has been using faithfully.  Chest x-ray 09/20/2021 no active lung disease. ?  ?Hypokalemia/hypomagnesemia/hypocalcemia-resolved prior to discharge ? potassium 3.9 calcium 7.3 magnesium 2.2.  ?  ?Metastatic bladder cancer followed by radiation oncology and oncology.  Started on OxyContin with Percocet for pain control.  Started XRT 09/18/2021. ?She was discharged on OxyContin 10 mg twice a day. ?  ?Hypertension-Norvasc was stopped Cardizem was started losartan was continued on discharge.   ?  ?Left rib closed fracture possibly pathologic from metastasis ? ?Estimated body mass  index is 28.23 kg/m? as calculated from the following: ?  Height as of this encounter: 5' 4.49" (1.638 m). ?  Weight as of this encounter: 75.8 kg. ? ?Discharge Instructions ? ?Discharge  Instructions   ? ? Diet - low sodium heart healthy   Complete by: As directed ?  ? Increase activity slowly   Complete by: As directed ?  ? ?  ? ?Allergies as of 09/22/2021   ?No Known Allergies ?  ? ?  ?Medication List  ?  ? ?STOP taking these medications   ? ?amLODipine 5 MG tablet ?Commonly known as: NORVASC ?  ?CULTURELLE DIGESTIVE DAILY PO ?  ?morphine 15 MG 12 hr tablet ?Commonly known as: MS CONTIN ?  ? ?  ? ?TAKE these medications   ? ?atorvastatin 10 MG tablet ?Commonly known as: LIPITOR ?Take 10 mg by mouth every evening. ?  ?celecoxib 200 MG capsule ?Commonly known as: CeleBREX ?Take 1 capsule (200 mg total) by mouth 2 (two) times daily. ?  ?dexamethasone 4 MG tablet ?Commonly known as: DECADRON ?Take 2 tablets (8 mg total) by mouth daily. Take daily x 3 days starting the day after cisplatin chemotherapy. Take with food. ?Notes to patient: AS SCHEDULED ?  ?diltiazem 30 MG tablet ?Commonly known as: CARDIZEM ?Take 2 tablets (60 mg total) by mouth 3 (three) times daily. ?  ?famotidine 40 MG tablet ?Commonly known as: PEPCID ?Take 1 tablet (40 mg total) by mouth 2 (two) times daily. ?  ?Fish Oil 1000 MG Caps ?Take 1,000 mg by mouth daily. ?  ?FOLIC ACID PO ?Take 1 tablet by mouth daily. ?  ?lidocaine-prilocaine cream ?Commonly known as: EMLA ?Apply to affected area once ?  ?losartan 100 MG tablet ?Commonly known as: COZAAR ?Take 100 mg by mouth daily at 6 (six) AM. ?  ?ondansetron 8 MG tablet ?Commonly known as: Zofran ?Take 1 tablet (8 mg total) by mouth 2 (two) times daily as needed. Start on the third day after cisplatin chemotherapy. ?  ?oxyCODONE 10 mg 12 hr tablet ?Commonly known as: OXYCONTIN ?Take 1 tablet (10 mg total) by mouth every 12 (twelve) hours. ?  ?oxyCODONE-acetaminophen 5-325 MG tablet ?Commonly known as: PERCOCET/ROXICET ?Take 1-2 tablets by mouth every 4 (four) hours as needed for up to 7 days for severe pain or moderate pain. ?  ?polyethylene glycol 17 g packet ?Commonly known as:  MIRALAX / GLYCOLAX ?Take 17 g by mouth daily as needed for mild constipation. ?  ?prochlorperazine 10 MG tablet ?Commonly known as: COMPAZINE ?Take 1 tablet (10 mg total) by mouth every 6 (six) hours as needed (Nausea or vomiting). ?  ?Vitamin D3 50 MCG (2000 UT) capsule ?Take 2,000 Units by mouth daily. ?  ? ?  ? ? Follow-up Information   ? ? Glenis Smoker, MD Follow up.   ?Specialty: Family Medicine ?Contact information: ?[address] ?[place] [address] ?[phone] ? ? ?  ?  ? ? Volanda Napoleon, MD Follow up.   ?Specialty: Oncology ?Contact information: ?[address] ?[address] ?[place] [address] ?[phone] ? ? ?  ?  ? ?  ?  ? ?  ? ?No Known Allergies ? ?Consultations: ?Dr Marin Olp ?Radiation onc ? ? ?Procedures/Studies: ?DG Chest 1 View ? ?Result Date: 09/20/2021 ?CLINICAL DATA:  Weakness EXAM: CHEST  1 VIEW COMPARISON:  09/15/2021, CT 09/15/2021 FINDINGS: Right-sided central venous port tip over the SVC. No focal opacity, pleural effusion, or pneumothorax. Stable cardiomediastinal silhouette  with aortic atherosclerosis. IMPRESSION: No radiographic evidence for acute cardiopulmonary abnormality. Electronically Signed   By: Donavan Foil M.D.   On: 09/20/2021 15:56  ? ?CT Angio Chest PE W and/or Wo Contrast ? ?Result Date: 09/15/2021 ?CLINICAL DATA:  Shortness of breath, tremors, metastatic bladder cancer, evaluate for PE EXAM: CT ANGIOGRAPHY CHEST WITH CONTRAST TECHNIQUE: Multidetector CT imaging of the chest was performed using the standard protocol during bolus administration of intravenous contrast. Multiplanar CT image reconstructions and MIPs were obtained to evaluate the vascular anatomy. RADIATION DOSE REDUCTION: This exam was performed according to the departmental dose-optimization program which includes automated exposure control, adjustment of the mA and/or kV according to patient size and/or use of iterative reconstruction technique. CONTRAST:  66m OMNIPAQUE IOHEXOL  350 MG/ML SOLN COMPARISON:  PET-CT dated 08/31/2021 FINDINGS: Cardiovascular: Satisfactory opacification of the bilateral pulmonary arteries to the segmental level. No evidence of pulmonary embolism. Although no

## 2021-09-24 ENCOUNTER — Other Ambulatory Visit: Payer: Self-pay | Admitting: Hematology & Oncology

## 2021-09-24 ENCOUNTER — Ambulatory Visit
Admission: RE | Admit: 2021-09-24 | Discharge: 2021-09-24 | Disposition: A | Discharge status: Home / Self Care | Payer: Medicare PPO | Source: Ambulatory Visit | Attending: Radiation Oncology | Admitting: Radiation Oncology

## 2021-09-24 ENCOUNTER — Other Ambulatory Visit: Payer: Self-pay

## 2021-09-24 ENCOUNTER — Encounter: Payer: Self-pay | Admitting: *Deleted

## 2021-09-24 DIAGNOSIS — C679 Malignant neoplasm of bladder, unspecified: Secondary | ICD-10-CM | POA: Diagnosis not present

## 2021-09-24 DIAGNOSIS — C7951 Secondary malignant neoplasm of bone: Secondary | ICD-10-CM | POA: Diagnosis not present

## 2021-09-24 DIAGNOSIS — C775 Secondary and unspecified malignant neoplasm of intrapelvic lymph nodes: Secondary | ICD-10-CM | POA: Diagnosis not present

## 2021-09-24 DIAGNOSIS — C787 Secondary malignant neoplasm of liver and intrahepatic bile duct: Secondary | ICD-10-CM | POA: Diagnosis not present

## 2021-09-24 DIAGNOSIS — Z5111 Encounter for antineoplastic chemotherapy: Secondary | ICD-10-CM | POA: Diagnosis not present

## 2021-09-24 DIAGNOSIS — Z510 Encounter for antineoplastic radiation therapy: Secondary | ICD-10-CM | POA: Diagnosis not present

## 2021-09-24 NOTE — Progress Notes (Signed)
Patient discharged over the weekend. She will need to come in this week for her next cycle of chemo. Message sent to Dr Marin Olp to confirm plan.  ? ?Message received to schedule this week. She is scheduled for radiation daily at 12 noon. [name], patient's caregiver called to follow up on scheduling. Answered logistic questions for her. She states that Friday is the best day for them to get patient here.  ? ?Spoke with Deisy. She is going to get the patient scheduled for Friday, but also coordinate with Radiation Oncology to reschedule that appointment for early am or late pm.  ? ?Oncology Nurse Navigator Documentation ? ? ?  09/24/2021  ?  8:30 AM  ?Oncology Nurse Navigator Flowsheets  ?Navigator Follow Up Date: 09/28/2021  ?Navigator Follow Up Reason: Follow-up Appointment;Chemotherapy  ?Navigator Location [facility]  ?Navigator Encounter Type Appt/Treatment Plan Review;Telephone  ?Telephone Appt Confirmation/Clarification;Incoming Call  ?Patient Visit Type MedOnc  ?Treatment Phase Active Tx  ?Barriers/Navigation Needs Coordination of Care;Education  ?Education Other  ?Interventions Coordination of Care;Education;Psycho-Social Support  ?Acuity Level 2-Minimal Needs (1-2 Barriers Identified)  ?Coordination of Care Appts  ?Education Method Verbal  ?Support Groups/Services Friends and Family  ?Time Spent with Patient 30  ?  ?

## 2021-09-25 ENCOUNTER — Ambulatory Visit
Admission: RE | Admit: 2021-09-25 | Discharge: 2021-09-25 | Disposition: A | Discharge status: Home / Self Care | Payer: Medicare PPO | Source: Ambulatory Visit | Attending: Radiation Oncology | Admitting: Radiation Oncology

## 2021-09-25 ENCOUNTER — Other Ambulatory Visit: Payer: Self-pay | Admitting: Hematology & Oncology

## 2021-09-25 DIAGNOSIS — C679 Malignant neoplasm of bladder, unspecified: Secondary | ICD-10-CM | POA: Diagnosis not present

## 2021-09-25 DIAGNOSIS — G893 Neoplasm related pain (acute) (chronic): Secondary | ICD-10-CM

## 2021-09-25 DIAGNOSIS — C775 Secondary and unspecified malignant neoplasm of intrapelvic lymph nodes: Secondary | ICD-10-CM | POA: Diagnosis not present

## 2021-09-25 DIAGNOSIS — Z5111 Encounter for antineoplastic chemotherapy: Secondary | ICD-10-CM | POA: Diagnosis not present

## 2021-09-25 DIAGNOSIS — Z510 Encounter for antineoplastic radiation therapy: Secondary | ICD-10-CM | POA: Diagnosis not present

## 2021-09-25 DIAGNOSIS — C787 Secondary malignant neoplasm of liver and intrahepatic bile duct: Secondary | ICD-10-CM | POA: Diagnosis not present

## 2021-09-25 DIAGNOSIS — C7951 Secondary malignant neoplasm of bone: Secondary | ICD-10-CM | POA: Diagnosis not present

## 2021-09-25 MED ORDER — OXYCODONE HCL ER 10 MG PO T12A: 10.0000 mg | EXTENDED_RELEASE_TABLET | Freq: Two times a day (BID) | ORAL | 0 refills | Status: DC

## 2021-09-26 ENCOUNTER — Inpatient Hospital Stay: Payer: Medicare PPO

## 2021-09-26 ENCOUNTER — Ambulatory Visit: Payer: Medicare PPO | Admitting: Radiation Oncology

## 2021-09-26 ENCOUNTER — Ambulatory Visit: Payer: Medicare PPO | Admitting: Nurse Practitioner

## 2021-09-26 ENCOUNTER — Other Ambulatory Visit: Payer: Self-pay

## 2021-09-26 ENCOUNTER — Ambulatory Visit
Admission: RE | Admit: 2021-09-26 | Discharge: 2021-09-26 | Disposition: A | Discharge status: Home / Self Care | Payer: Medicare PPO | Source: Ambulatory Visit | Attending: Radiation Oncology | Admitting: Radiation Oncology

## 2021-09-26 ENCOUNTER — Ambulatory Visit: Payer: Medicare PPO

## 2021-09-26 DIAGNOSIS — C7951 Secondary malignant neoplasm of bone: Secondary | ICD-10-CM | POA: Diagnosis not present

## 2021-09-26 DIAGNOSIS — Z5111 Encounter for antineoplastic chemotherapy: Secondary | ICD-10-CM | POA: Diagnosis not present

## 2021-09-26 DIAGNOSIS — C787 Secondary malignant neoplasm of liver and intrahepatic bile duct: Secondary | ICD-10-CM | POA: Diagnosis not present

## 2021-09-26 DIAGNOSIS — C679 Malignant neoplasm of bladder, unspecified: Secondary | ICD-10-CM | POA: Diagnosis not present

## 2021-09-26 DIAGNOSIS — Z510 Encounter for antineoplastic radiation therapy: Secondary | ICD-10-CM | POA: Diagnosis not present

## 2021-09-26 DIAGNOSIS — C775 Secondary and unspecified malignant neoplasm of intrapelvic lymph nodes: Secondary | ICD-10-CM | POA: Diagnosis not present

## 2021-09-27 ENCOUNTER — Ambulatory Visit
Admission: RE | Admit: 2021-09-27 | Discharge: 2021-09-27 | Disposition: A | Discharge status: Home / Self Care | Payer: Medicare PPO | Source: Ambulatory Visit | Attending: Radiation Oncology | Admitting: Radiation Oncology

## 2021-09-27 ENCOUNTER — Inpatient Hospital Stay: Payer: Medicare PPO

## 2021-09-27 ENCOUNTER — Ambulatory Visit: Payer: Medicare PPO

## 2021-09-27 ENCOUNTER — Telehealth: Payer: Self-pay | Admitting: Nurse Practitioner

## 2021-09-27 ENCOUNTER — Encounter: Payer: Medicare PPO | Admitting: Nurse Practitioner

## 2021-09-27 ENCOUNTER — Encounter: Payer: Self-pay | Admitting: *Deleted

## 2021-09-27 VITALS — BP 137/83 | HR 96 | Temp 97.3°F | Resp 19

## 2021-09-27 DIAGNOSIS — C679 Malignant neoplasm of bladder, unspecified: Secondary | ICD-10-CM | POA: Diagnosis not present

## 2021-09-27 DIAGNOSIS — Z5111 Encounter for antineoplastic chemotherapy: Secondary | ICD-10-CM | POA: Diagnosis not present

## 2021-09-27 DIAGNOSIS — Z510 Encounter for antineoplastic radiation therapy: Secondary | ICD-10-CM | POA: Diagnosis not present

## 2021-09-27 DIAGNOSIS — C787 Secondary malignant neoplasm of liver and intrahepatic bile duct: Secondary | ICD-10-CM | POA: Diagnosis not present

## 2021-09-27 DIAGNOSIS — C775 Secondary and unspecified malignant neoplasm of intrapelvic lymph nodes: Secondary | ICD-10-CM | POA: Diagnosis not present

## 2021-09-27 DIAGNOSIS — C7951 Secondary malignant neoplasm of bone: Secondary | ICD-10-CM | POA: Diagnosis not present

## 2021-09-27 LAB — CMP (CANCER CENTER ONLY)
ALT: 9 U/L (ref 0–44)
AST: 13 U/L — ABNORMAL LOW (ref 15–41)
Albumin: 3.2 g/dL — ABNORMAL LOW (ref 3.5–5.0)
Alkaline Phosphatase: 235 U/L — ABNORMAL HIGH (ref 38–126)
Anion gap: 9 (ref 5–15)
BUN: 8 mg/dL (ref 8–23)
CO2: 25 mmol/L (ref 22–32)
Calcium: 8.4 mg/dL — ABNORMAL LOW (ref 8.9–10.3)
Chloride: 101 mmol/L (ref 98–111)
Creatinine: 0.56 mg/dL (ref 0.44–1.00)
GFR, Estimated: >60 mL/min (ref >60)
Glucose, Bld: 105 mg/dL — ABNORMAL HIGH (ref 70–99)
Potassium: 4.1 mmol/L (ref 3.5–5.1)
Sodium: 135 mmol/L (ref 135–145)
Total Bilirubin: 0.5 mg/dL (ref 0.3–1.2)
Total Protein: 6.4 g/dL — ABNORMAL LOW (ref 6.5–8.1)

## 2021-09-27 LAB — CBC WITH DIFFERENTIAL (CANCER CENTER ONLY)
Abs Immature Granulocytes: 0.07 10*3/uL (ref 0.00–0.07)
Basophils Absolute: 0 10*3/uL (ref 0.0–0.1)
Basophils Relative: 0 %
Eosinophils Absolute: 0.1 10*3/uL (ref 0.0–0.5)
Eosinophils Relative: 1 %
HCT: 31.3 % — ABNORMAL LOW (ref 36.0–46.0)
Hemoglobin: 9.9 g/dL — ABNORMAL LOW (ref 12.0–15.0)
Immature Granulocytes: 1 %
Lymphocytes Relative: 9 %
Lymphs Abs: 0.7 10*3/uL (ref 0.7–4.0)
MCH: 30.9 pg (ref 26.0–34.0)
MCHC: 31.6 g/dL (ref 30.0–36.0)
MCV: 97.8 fL (ref 80.0–100.0)
Monocytes Absolute: 1 10*3/uL (ref 0.1–1.0)
Monocytes Relative: 12 %
Neutro Abs: 6.2 10*3/uL (ref 1.7–7.7)
Neutrophils Relative %: 77 %
Platelet Count: 329 10*3/uL (ref 150–400)
RBC: 3.2 MIL/uL — ABNORMAL LOW (ref 3.87–5.11)
RDW: 14.5 % (ref 11.5–15.5)
WBC Count: 8 10*3/uL (ref 4.0–10.5)
nRBC: 0 % (ref 0.0–0.2)

## 2021-09-27 MED ORDER — SODIUM CHLORIDE 0.9 % IV SOLN
800.0000 mg/m2 | Freq: Once | INTRAVENOUS | Status: AC
  Administered 2021-09-27: 1482 mg via INTRAVENOUS
  Filled 2021-09-27: qty 26.3

## 2021-09-27 MED ORDER — SODIUM CHLORIDE 0.9% FLUSH
10.0000 mL | INTRAVENOUS | Status: DC | PRN
  Administered 2021-09-27: 10 mL

## 2021-09-27 MED ORDER — MAGNESIUM SULFATE 2 GM/50ML IV SOLN
2.0000 g | Freq: Once | INTRAVENOUS | Status: AC
  Administered 2021-09-27: 2 g via INTRAVENOUS
  Filled 2021-09-27: qty 50

## 2021-09-27 MED ORDER — HEPARIN SOD (PORK) LOCK FLUSH 100 UNIT/ML IV SOLN
500.0000 [IU] | Freq: Once | INTRAVENOUS | Status: AC | PRN
  Administered 2021-09-27: 500 [IU]

## 2021-09-27 MED ORDER — SODIUM CHLORIDE 0.9 % IV SOLN
10.0000 mg | Freq: Once | INTRAVENOUS | Status: AC
  Administered 2021-09-27: 10 mg via INTRAVENOUS
  Filled 2021-09-27: qty 10

## 2021-09-27 MED ORDER — SODIUM CHLORIDE 0.9 % IV SOLN
150.0000 mg | Freq: Once | INTRAVENOUS | Status: AC
  Administered 2021-09-27: 150 mg via INTRAVENOUS
  Filled 2021-09-27: qty 150

## 2021-09-27 MED ORDER — PALONOSETRON HCL INJECTION 0.25 MG/5ML: 0.2500 mg | Freq: Once | INTRAVENOUS | Status: DC

## 2021-09-27 MED ORDER — SODIUM CHLORIDE 0.9 % IV SOLN
70.0000 mg/m2 | Freq: Once | INTRAVENOUS | Status: AC
  Administered 2021-09-27: 130 mg via INTRAVENOUS
  Filled 2021-09-27: qty 130

## 2021-09-27 MED ORDER — SODIUM CHLORIDE 0.9 % IV SOLN: Freq: Once | INTRAVENOUS | Status: AC

## 2021-09-27 MED ORDER — POTASSIUM CHLORIDE IN NACL 20-0.9 MEQ/L-% IV SOLN
Freq: Once | INTRAVENOUS | Status: AC
  Filled 2021-09-27: qty 1000

## 2021-09-27 MED ORDER — PALONOSETRON HCL INJECTION 0.25 MG/5ML
INTRAVENOUS | Status: AC
  Administered 2021-09-27: 0.25 mg
  Filled 2021-09-27: qty 5

## 2021-09-27 NOTE — Progress Notes (Signed)
Patient here for cycle 2. She was recently discharged from the hospital and is receiving radiation. She has a few more treatments but feels like the radiation is starting to help with her pain.  ? ?She currently has no follow up appointments. Reviewed her plan with Dr Marin Olp. She will need her day 8 scheduled as well as follow up. Scheduling message sent.  ? ? ?

## 2021-09-27 NOTE — Patient Instructions (Signed)
[place] [facility] [place]  Discharge Instructions: ?Thank you for choosing [facility] to provide your oncology and hematology care.  ? ?If you have a lab appointment with the [facility], please go directly to the [facility] and check in at the registration area. ? ?Wear comfortable clothing and clothing appropriate for easy access to any Portacath or PICC line.  ? ?We strive to give you quality time with your provider. You may need to reschedule your appointment if you arrive late (15 or more minutes).  Arriving late affects you and other patients whose appointments are after yours.  Also, if you miss three or more appointments without notifying the office, you may be dismissed from the clinic at the provider?s discretion.    ?  ?For prescription refill requests, have your pharmacy contact our office and allow 72 hours for refills to be completed.   ? ?Today you received the following chemotherapy and/or immunotherapy agents Gemzar and Cisplatin    ?  ?To help prevent nausea and vomiting after your treatment, we encourage you to take your nausea medication as directed. ? ?BELOW ARE SYMPTOMS THAT SHOULD BE REPORTED IMMEDIATELY: ?*FEVER GREATER THAN 100.4 F (38 ?C) OR HIGHER ?*CHILLS OR SWEATING ?*NAUSEA AND VOMITING THAT IS NOT CONTROLLED WITH YOUR NAUSEA MEDICATION ?*UNUSUAL SHORTNESS OF BREATH ?*UNUSUAL BRUISING OR BLEEDING ?*URINARY PROBLEMS (pain or burning when urinating, or frequent urination) ?*BOWEL PROBLEMS (unusual diarrhea, constipation, pain near the anus) ?TENDERNESS IN MOUTH AND THROAT WITH OR WITHOUT PRESENCE OF ULCERS (sore throat, sores in mouth, or a toothache) ?UNUSUAL RASH, SWELLING OR PAIN  ?UNUSUAL VAGINAL DISCHARGE OR ITCHING  ? ?Items with * indicate a potential emergency and should be followed up as soon as possible or go to the Emergency Department if any problems should occur. ? ?Please show the CHEMOTHERAPY ALERT CARD or IMMUNOTHERAPY ALERT CARD at  check-in to the Emergency Department and triage nurse. ?Should you have questions after your visit or need to cancel or reschedule your appointment, please contact [facility]  [phone] and follow the prompts.  Office hours are 8:00 a.m. to 4:30 p.m. Monday - Friday. Please note that voicemails left after 4:00 p.m. may not be returned until the following business day.  We are closed weekends and major holidays. You have access to a nurse at all times for urgent questions. Please call the main number to the clinic [phone] and follow the prompts. ? ?For any non-urgent questions, you may also contact your provider using MyChart. We now offer e-Visits for anyone 70 and older to request care online for non-urgent symptoms. For details visit [email or web address]. ?  ?Also download the MyChart app! Go to the app store, search "MyChart", open the app, select [facility], and log in with your MyChart username and password. ? ?Due to Covid, a mask is required upon entering the hospital/clinic. If you do not have a mask, one will be given to you upon arrival. For doctor visits, patients may have 1 support person aged 70 or older with them. For treatment visits, patients cannot have anyone with them due to current Covid guidelines and our immunocompromised population.  ?

## 2021-09-27 NOTE — Telephone Encounter (Signed)
Scheduled per 3/30 secure chat, pt has been called and confirmed  ?

## 2021-09-27 NOTE — Progress Notes (Signed)
Per Dr. Marin Olp ok to use CMP results from 09/21/2021 for today's treatment. ? ?Per Dr. Marin Olp ok to run cisplatin with post hydration fluids ? ?

## 2021-09-27 NOTE — Patient Instructions (Signed)

## 2021-09-28 ENCOUNTER — Other Ambulatory Visit: Payer: Self-pay | Admitting: *Deleted

## 2021-09-28 ENCOUNTER — Other Ambulatory Visit (HOSPITAL_BASED_OUTPATIENT_CLINIC_OR_DEPARTMENT_OTHER): Payer: Self-pay

## 2021-09-28 ENCOUNTER — Ambulatory Visit: Payer: Medicare PPO

## 2021-09-28 ENCOUNTER — Encounter: Payer: Self-pay | Admitting: Hematology & Oncology

## 2021-09-28 ENCOUNTER — Telehealth: Payer: Self-pay | Admitting: *Deleted

## 2021-09-28 DIAGNOSIS — G893 Neoplasm related pain (acute) (chronic): Secondary | ICD-10-CM

## 2021-09-28 MED ORDER — XTAMPZA ER 9 MG PO C12A: 9.0000 mg | EXTENDED_RELEASE_CAPSULE | Freq: Two times a day (BID) | ORAL | 0 refills | Status: DC

## 2021-09-28 MED ORDER — OXYCODONE HCL ER 10 MG PO T12A
10.0000 mg | EXTENDED_RELEASE_TABLET | Freq: Two times a day (BID) | ORAL | 0 refills | Status: DC
  Filled 2021-09-28: qty 60, 30d supply, fill #0

## 2021-09-28 NOTE — Telephone Encounter (Signed)
Call received from patient stating that [facility] informed her today that they will not have the Oxycontin that was ordered by Dr Marin Olp on 09/25/21 available for her until Monday, 10/01/21.  [facility] placed [facility] and they state that they are awaiting PA for Oxycontin.  Requested that PA be faxed to [phone].  PA then completed by this RN and Oxycontin denied.  Prescription for Xtampza 9 mg every 12 hrs [facility] per Dr. Marin [facility]p which [facility] states [facility] do [facility] on hand.  Pt notified of above and appreciative of assistance.   ?

## 2021-10-01 ENCOUNTER — Ambulatory Visit
Admission: RE | Admit: 2021-10-01 | Discharge: 2021-10-01 | Disposition: A | Discharge status: Home / Self Care | Payer: Medicare PPO | Source: Ambulatory Visit | Attending: Radiation Oncology | Admitting: Radiation Oncology

## 2021-10-01 ENCOUNTER — Other Ambulatory Visit: Payer: Self-pay

## 2021-10-01 DIAGNOSIS — C679 Malignant neoplasm of bladder, unspecified: Secondary | ICD-10-CM | POA: Insufficient documentation

## 2021-10-01 DIAGNOSIS — Z510 Encounter for antineoplastic radiation therapy: Secondary | ICD-10-CM | POA: Diagnosis not present

## 2021-10-01 DIAGNOSIS — C775 Secondary and unspecified malignant neoplasm of intrapelvic lymph nodes: Secondary | ICD-10-CM | POA: Diagnosis not present

## 2021-10-01 DIAGNOSIS — C787 Secondary malignant neoplasm of liver and intrahepatic bile duct: Secondary | ICD-10-CM | POA: Insufficient documentation

## 2021-10-01 DIAGNOSIS — Z5111 Encounter for antineoplastic chemotherapy: Secondary | ICD-10-CM | POA: Diagnosis not present

## 2021-10-01 DIAGNOSIS — C7951 Secondary malignant neoplasm of bone: Secondary | ICD-10-CM | POA: Insufficient documentation

## 2021-10-02 ENCOUNTER — Ambulatory Visit: Payer: Medicare PPO

## 2021-10-02 ENCOUNTER — Other Ambulatory Visit (HOSPITAL_BASED_OUTPATIENT_CLINIC_OR_DEPARTMENT_OTHER): Payer: Self-pay

## 2021-10-02 ENCOUNTER — Encounter: Payer: Self-pay | Admitting: Radiation Oncology

## 2021-10-02 ENCOUNTER — Inpatient Hospital Stay: Payer: Medicare PPO | Attending: Family Medicine | Admitting: Nurse Practitioner

## 2021-10-02 ENCOUNTER — Ambulatory Visit
Admission: RE | Admit: 2021-10-02 | Discharge: 2021-10-02 | Disposition: A | Discharge status: Home / Self Care | Payer: Medicare PPO | Source: Ambulatory Visit | Attending: Radiation Oncology | Admitting: Radiation Oncology

## 2021-10-02 VITALS — BP 119/72 | HR 111 | Temp 98.1°F | Resp 18 | Ht 64.49 in | Wt 157.0 lb

## 2021-10-02 DIAGNOSIS — C679 Malignant neoplasm of bladder, unspecified: Secondary | ICD-10-CM | POA: Diagnosis not present

## 2021-10-02 DIAGNOSIS — C775 Secondary and unspecified malignant neoplasm of intrapelvic lymph nodes: Secondary | ICD-10-CM

## 2021-10-02 DIAGNOSIS — C7951 Secondary malignant neoplasm of bone: Secondary | ICD-10-CM | POA: Insufficient documentation

## 2021-10-02 DIAGNOSIS — Z515 Encounter for palliative care: Secondary | ICD-10-CM | POA: Diagnosis not present

## 2021-10-02 DIAGNOSIS — G893 Neoplasm related pain (acute) (chronic): Secondary | ICD-10-CM

## 2021-10-02 DIAGNOSIS — K5903 Drug induced constipation: Secondary | ICD-10-CM | POA: Diagnosis not present

## 2021-10-02 DIAGNOSIS — Z7189 Other specified counseling: Secondary | ICD-10-CM | POA: Diagnosis not present

## 2021-10-02 DIAGNOSIS — Z510 Encounter for antineoplastic radiation therapy: Secondary | ICD-10-CM | POA: Diagnosis not present

## 2021-10-02 DIAGNOSIS — C787 Secondary malignant neoplasm of liver and intrahepatic bile duct: Secondary | ICD-10-CM

## 2021-10-02 DIAGNOSIS — Z5111 Encounter for antineoplastic chemotherapy: Secondary | ICD-10-CM | POA: Diagnosis not present

## 2021-10-03 ENCOUNTER — Encounter: Payer: Self-pay | Admitting: Hematology & Oncology

## 2021-10-03 ENCOUNTER — Encounter: Payer: Self-pay | Admitting: Nurse Practitioner

## 2021-10-03 ENCOUNTER — Ambulatory Visit: Payer: Medicare PPO

## 2021-10-03 NOTE — Progress Notes (Signed)
? ?  ?Palliative Medicine ?[employer]  ?[phone] [phone] ? ? ?Name: [name] ?Date: 10/03/2021 ?MRN: [id number]  ?DOB: [birth year] ? ?Patient Care Team: ?Glenis Smoker, MD as PCP - General (Family Medicine) ?Volanda Napoleon, MD as Medical Oncologist (Oncology) ?Cordelia Poche, RN as Oncology Nurse Navigator ?Pickenpack-Cousar, Carlena Sax, NP as Nurse Practitioner (Nurse Practitioner)  ? ?I connected with Darlina Guys on 10/03/21 at 12:45 PM EDT by phone and verified that I am speaking with the correct person using two identifiers.  ? ?I discussed the limitations, risks, security and privacy concerns of performing an evaluation and management service by telemedicine and the availability of in-person appointments. I also discussed with the patient that there may be a patient responsible charge related to this service. The patient expressed understanding and agreed to proceed.  ? ?Other persons participating in the visit and their role in the encounter: [name] Johnson (caregiver/HCPOA) ? ?Patient?s location: Home ?Provider?s location: [place] [facility] ? ?Chief Complaint: Neoplasm related pain ? ?REASON FOR CONSULTATION: ?Cheryl Singh is a 71 y.o. 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  Patient is actively followed by Dr. Marin Olp with treatment plan in place (Gemzar/cisplatin/Zometa).  Palliative ask to see for symptom management. ? ? ?SOCIAL HISTORY:    ? reports that she has been smoking cigarettes. She has a 20.00 pack-year smoking history. She has never used smokeless tobacco. She reports current alcohol use. She reports that she does not use drugs. ? ?ADVANCE DIRECTIVES:  ?Patient reports she does have advanced directives in place.  Confirms that her caregiver [name] is her documented healthcare power of attorney.  Patient states she is open to heroic measures only  under the circumstances of a meaningful outcome or some improvement.  If this is not the case she would not want her life prolonged. ? ?CODE STATUS: Full code ? ?PAST MEDICAL HISTORY: ?Past Medical History:  ?Diagnosis Date  ? Arthritis   ? 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 ([place]) 08/16/2021  ? Goals of care, counseling/discussion 08/16/2021  ? Hypertension   ? ? ?PAST SURGICAL HISTORY:  ?Past Surgical History:  ?Procedure Laterality Date  ? HEART CHAMBER REVISION    ? IR IMAGING GUIDED PORT INSERTION  08/20/2021  ? TRANSURETHRAL RESECTION OF BLADDER TUMOR Bilateral 07/17/2021  ? Procedure: CYSTOSCOPY BILATERAL RETROGRADES TRANSURETHRAL RESECTION OF BLADDER TUMOR (TURBT);  Surgeon: Remi Haggard, MD;  Location: [facility];  Service: Urology;  Laterality: Bilateral;  ? ? ?HEMATOLOGY/ONCOLOGY HISTORY:  ?Oncology History  ?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 [facility])  ?08/16/2021 Initial Diagnosis  ? 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 [facility]) ?  ?08/16/2021 Cancer Staging  ? Staging form: Urinary Bladder, AJCC 8th Edition ?- Clinical stage from 08/16/2021: cT2, cN3, cM1 - Signed by Volanda Napoleon, MD on 08/16/2021 ?WHO/ISUP grade (low/high): High Grade ?Histologic grading system: 2 grade system ?  ?08/28/2021 -  Chemotherapy  ? Patient is on Treatment Plan : BLADDER Cisplatin D1 + Gemcitabine D1,8 q21d x 6 Cycles  ?   ?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 [facility])  ?08/16/2021 Initial Diagnosis  ? 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 [facility]) ?  ?08/28/2021 -  Chemotherapy  ? Patient is on Treatment Plan : BLADDER Cisplatin D1 + Gemcitabine D1,8 q21d x 6 Cycles  ?   ? ? ?  ALLERGIES:  has No Known Allergies. ? ?MEDICATIONS:  ?Current Outpatient Medications  ?Medication Sig Dispense Refill  ? atorvastatin (LIPITOR) 10 MG tablet Take 10 mg by mouth every evening.    ? celecoxib (CELEBREX) 200 MG capsule Take 1  capsule (200 mg total) by mouth 2 (two) times daily. 60 capsule 4  ? Cholecalciferol (VITAMIN D3) 50 MCG (2000 UT) capsule Take 2,000 Units by mouth daily.    ? dexamethasone (DECADRON) 4 MG tablet Take 2 tablets (8 mg total) by mouth daily. Take daily x 3 days starting the day after cisplatin chemotherapy. Take with food. 30 tablet 1  ? diltiazem (CARDIZEM) 30 MG tablet Take 2 tablets (60 mg total) by mouth 3 (three) times daily. 60 tablet 2  ? famotidine (PEPCID) 40 MG tablet Take 1 tablet (40 mg total) by mouth 2 (two) times daily. 60 tablet 4  ? FOLIC ACID PO Take 1 tablet by mouth daily. (Patient not taking: Reported on 09/15/2021)    ? lidocaine-prilocaine (EMLA) cream Apply to affected area once 30 g 3  ? losartan (COZAAR) 100 MG tablet Take 100 mg by mouth daily at 6 (six) AM.    ? Omega-3 Fatty Acids (FISH OIL) 1000 MG CAPS Take 1,000 mg by mouth daily.    ? ondansetron (ZOFRAN) 8 MG tablet Take 1 tablet (8 mg total) by mouth 2 (two) times daily as needed. Start on the third day after cisplatin chemotherapy. 30 tablet 1  ? oxyCODONE (OXYCONTIN) 10 mg 12 hr tablet Take 1 tablet (10 mg total) by mouth every 12 (twelve) hours. 60 tablet 0  ? oxyCODONE ER (XTAMPZA ER) 9 MG C12A Take 9 mg by mouth every 12 (twelve) hours. 60 capsule 0  ? polyethylene glycol (MIRALAX / GLYCOLAX) 17 g packet Take 17 g by mouth daily as needed for mild constipation.    ? prochlorperazine (COMPAZINE) 10 MG tablet Take 1 tablet (10 mg total) by mouth every 6 (six) hours as needed (Nausea or vomiting). 30 tablet 1  ? ?No current facility-administered medications for this visit.  ? ? ?VITAL SIGNS: ?BP 119/72 (BP Location: Left Arm, Patient Position: Sitting)   Pulse (!) 111   Temp 98.1 ?F (36.7 ?C) (Oral)   Resp 18   Ht 5' 4.49" (1.638 m)   Wt 157 lb (71.2 kg)   SpO2 100%   BMI 26.54 kg/m?  ?Filed Weights  ? 10/02/21 1250  ?Weight: 157 lb (71.2 kg)  ?  ?Estimated body mass index is 26.54 kg/m? as calculated from the following: ?   Height as of this encounter: 5' 4.49" (1.638 m). ?  Weight as of this encounter: 157 lb (71.2 kg). ? ? ?PERFORMANCE STATUS (ECOG) : 1 - Symptomatic but completely ambulatory ? ?Assessment ?Gen: NAD, well developed, in a wheelchair  ?Cardio: Tachycardic ?Resp: normal breathing pattern  ?Neuro: AAO x4, mood appropriate  ? ? ?IMPRESSION: ? ?Cheryl Singh presents to the clinic for symptom management follow-up .  Her friend/caregiver [name] is also with her. No acute distress noted. She is in a wheelchair. She is glad to be out of the hospital and back home.  ? ?Neoplasm related pain ?Ms. Rikard reports ongoing left side, sternal, back pain.  When hospitalized pain was better controlled. Dr. Marin Olp was able to switch her to Oxycontin which she tolerated however after discharging this was unable to be approved by insurance. She was switched to [facility] however she reports after taking for 2 days she discontinued because  it made her feel horrible, heart racing, and gait unsteady. We discussed having further conversations about alternatives or maybe even taking Xtampza once daily at bedtime to allow for some support. She felt like she did ok with the MS Contin however does not like switching back and forth or to different medications. She declines and shares she would rather continue with  Celebrex. She does report taking Percocet that she had left when she had increased pain for a few days.  ? ?She has a follow-up with Dr. Marin Olp on 10/04/21 . Will allow her to attend this visit and discuss further with him before changing regimen.  ? ?We will plan to closely monitor and have follow-up phone call for further evaluation in 1-2 weeks.  ? ?Ms. Gunnoe verbalized understanding and appreciation of her medical team following together to hopefully gain her better pain control, increase in quality of life, and prevent hospitalizations. ? ?Constipation ?Patient advised to take MiraLAX daily to prevent constipation in the  setting of opioid use.  Reports she has irregularities in her bowel movement schedules.  Advised if no bowel movement within 24 to 48 hours to increase MiraLAX to twice daily.  We will continue to closely monitor.

## 2021-10-04 ENCOUNTER — Ambulatory Visit: Payer: Medicare PPO

## 2021-10-04 ENCOUNTER — Other Ambulatory Visit: Payer: Self-pay

## 2021-10-04 ENCOUNTER — Inpatient Hospital Stay: Payer: Medicare PPO

## 2021-10-04 ENCOUNTER — Encounter: Payer: Self-pay | Admitting: Hematology & Oncology

## 2021-10-04 ENCOUNTER — Inpatient Hospital Stay: Payer: Medicare PPO | Admitting: Hematology & Oncology

## 2021-10-04 ENCOUNTER — Encounter: Payer: Self-pay | Admitting: *Deleted

## 2021-10-04 VITALS — BP 106/64 | HR 104 | Temp 97.7°F | Resp 18 | Ht 64.0 in | Wt 157.0 lb

## 2021-10-04 DIAGNOSIS — Z5111 Encounter for antineoplastic chemotherapy: Secondary | ICD-10-CM | POA: Insufficient documentation

## 2021-10-04 DIAGNOSIS — C7951 Secondary malignant neoplasm of bone: Secondary | ICD-10-CM | POA: Insufficient documentation

## 2021-10-04 DIAGNOSIS — C679 Malignant neoplasm of bladder, unspecified: Secondary | ICD-10-CM

## 2021-10-04 LAB — CMP (CANCER CENTER ONLY)
ALT: 31 U/L (ref 0–44)
AST: 18 U/L (ref 15–41)
Albumin: 3.2 g/dL — ABNORMAL LOW (ref 3.5–5.0)
Alkaline Phosphatase: 144 U/L — ABNORMAL HIGH (ref 38–126)
Anion gap: 10 (ref 5–15)
BUN: 22 mg/dL (ref 8–23)
CO2: 25 mmol/L (ref 22–32)
Calcium: 8.1 mg/dL — ABNORMAL LOW (ref 8.9–10.3)
Chloride: 97 mmol/L — ABNORMAL LOW (ref 98–111)
Creatinine: 0.72 mg/dL (ref 0.44–1.00)
GFR, Estimated: >60 mL/min (ref >60)
Glucose, Bld: 144 mg/dL — ABNORMAL HIGH (ref 70–99)
Potassium: 3.7 mmol/L (ref 3.5–5.1)
Sodium: 132 mmol/L — ABNORMAL LOW (ref 135–145)
Total Bilirubin: 0.4 mg/dL (ref 0.3–1.2)
Total Protein: 5.8 g/dL — ABNORMAL LOW (ref 6.5–8.1)

## 2021-10-04 LAB — CBC WITH DIFFERENTIAL (CANCER CENTER ONLY)
Abs Immature Granulocytes: 0.02 10*3/uL (ref 0.00–0.07)
Basophils Absolute: 0 10*3/uL (ref 0.0–0.1)
Basophils Relative: 0 %
Eosinophils Absolute: 0.1 10*3/uL (ref 0.0–0.5)
Eosinophils Relative: 3 %
HCT: 28.8 % — ABNORMAL LOW (ref 36.0–46.0)
Hemoglobin: 9.5 g/dL — ABNORMAL LOW (ref 12.0–15.0)
Immature Granulocytes: 1 %
Lymphocytes Relative: 4 %
Lymphs Abs: 0.1 10*3/uL — ABNORMAL LOW (ref 0.7–4.0)
MCH: 30.9 pg (ref 26.0–34.0)
MCHC: 33 g/dL (ref 30.0–36.0)
MCV: 93.8 fL (ref 80.0–100.0)
Monocytes Absolute: 0.1 10*3/uL (ref 0.1–1.0)
Monocytes Relative: 3 %
Neutro Abs: 3.4 10*3/uL (ref 1.7–7.7)
Neutrophils Relative %: 89 %
Platelet Count: 132 10*3/uL — ABNORMAL LOW (ref 150–400)
RBC: 3.07 MIL/uL — ABNORMAL LOW (ref 3.87–5.11)
RDW: 14.6 % (ref 11.5–15.5)
WBC Count: 3.8 10*3/uL — ABNORMAL LOW (ref 4.0–10.5)
nRBC: 0 % (ref 0.0–0.2)

## 2021-10-04 LAB — PREPARE RBC (CROSSMATCH)

## 2021-10-04 LAB — IRON AND IRON BINDING CAPACITY (CC-WL,HP ONLY)
Iron: 91 ug/dL (ref 28–170)
Saturation Ratios: 43 % — ABNORMAL HIGH (ref 10.4–31.8)
TIBC: 213 ug/dL — ABNORMAL LOW (ref 250–450)
UIBC: 122 ug/dL — ABNORMAL LOW (ref 148–442)

## 2021-10-04 MED ORDER — SODIUM CHLORIDE 0.9% FLUSH
10.0000 mL | INTRAVENOUS | Status: DC | PRN
  Administered 2021-10-04: 10 mL

## 2021-10-04 MED ORDER — HEPARIN SOD (PORK) LOCK FLUSH 100 UNIT/ML IV SOLN
500.0000 [IU] | Freq: Once | INTRAVENOUS | Status: AC | PRN
  Administered 2021-10-04: 500 [IU]

## 2021-10-04 MED ORDER — SODIUM CHLORIDE 0.9 % IV SOLN
800.0000 mg/m2 | Freq: Once | INTRAVENOUS | Status: AC
  Administered 2021-10-04: 1482 mg via INTRAVENOUS
  Filled 2021-10-04: qty 26.3

## 2021-10-04 MED ORDER — SODIUM CHLORIDE 0.9 % IV SOLN: Freq: Once | INTRAVENOUS | Status: AC

## 2021-10-04 MED ORDER — PROCHLORPERAZINE MALEATE 10 MG PO TABS
10.0000 mg | ORAL_TABLET | Freq: Once | ORAL | Status: AC
  Administered 2021-10-04: 10 mg via ORAL
  Filled 2021-10-04: qty 1

## 2021-10-04 NOTE — Patient Instructions (Signed)

## 2021-10-04 NOTE — Patient Instructions (Signed)
Gemcitabine injection ?What is this medication? ?GEMCITABINE (jem SYE ta been) is a chemotherapy drug. This medicine is used to treat many types of cancer like breast cancer, lung cancer, pancreatic cancer, and ovarian cancer. ?This medicine may be used for other purposes; ask your health care provider or pharmacist if you have questions. ?COMMON BRAND NAME(S): Gemzar, Infugem ?What should I tell my care team before I take this medication? ?They need to know if you have any of these conditions: ?blood disorders ?infection ?kidney disease ?liver disease ?lung or breathing disease, like asthma ?recent or ongoing radiation therapy ?an unusual or allergic reaction to gemcitabine, other chemotherapy, other medicines, foods, dyes, or preservatives ?pregnant or trying to get pregnant ?breast-feeding ?How should I use this medication? ?This drug is given as an infusion into a vein. It is administered in a hospital or clinic by a specially trained health care professional. ?Talk to your pediatrician regarding the use of this medicine in children. Special care may be needed. ?Overdosage: If you think you have taken too much of this medicine contact a poison control center or emergency room at once. ?NOTE: This medicine is only for you. Do not share this medicine with others. ?What if I miss a dose? ?It is important not to miss your dose. Call your doctor or health care professional if you are unable to keep an appointment. ?What may interact with this medication? ?medicines to increase blood counts like filgrastim, pegfilgrastim, sargramostim ?some other chemotherapy drugs like cisplatin ?vaccines ?Talk to your doctor or health care professional before taking any of these medicines: ?acetaminophen ?aspirin ?ibuprofen ?ketoprofen ?naproxen ?This list may not describe all possible interactions. Give your health care provider a list of all the medicines, herbs, non-prescription drugs, or dietary supplements you use. Also tell  them if you smoke, drink alcohol, or use illegal drugs. Some items may interact with your medicine. ?What should I watch for while using this medication? ?Visit your doctor for checks on your progress. This drug may make you feel generally unwell. This is not uncommon, as chemotherapy can affect healthy cells as well as cancer cells. Report any side effects. Continue your course of treatment even though you feel ill unless your doctor tells you to stop. ?In some cases, you may be given additional medicines to help with side effects. Follow all directions for their use. ?Call your doctor or health care professional for advice if you get a fever, chills or sore throat, or other symptoms of a cold or flu. Do not treat yourself. This drug decreases your body's ability to fight infections. Try to avoid being around people who are sick. ?This medicine may increase your risk to bruise or bleed. Call your doctor or health care professional if you notice any unusual bleeding. ?Be careful brushing and flossing your teeth or using a toothpick because you may get an infection or bleed more easily. If you have any dental work done, tell your dentist you are receiving this medicine. ?Avoid taking products that contain aspirin, acetaminophen, ibuprofen, naproxen, or ketoprofen unless instructed by your doctor. These medicines may hide a fever. ?Do not become pregnant while taking this medicine or for 6 months after stopping it. Women should inform their doctor if they wish to become pregnant or think they might be pregnant. Men should not father a child while taking this medicine and for 3 months after stopping it. There is a potential for serious side effects to an unborn child. Talk to your health care professional   or pharmacist for more information. Do not breast-feed an infant while taking this medicine or for at least 1 week after stopping it. ?Men should inform their doctors if they wish to father a child. This medicine may  lower sperm counts. Talk with your doctor or health care professional if you are concerned about your fertility. ?What side effects may I notice from receiving this medication? ?Side effects that you should report to your doctor or health care professional as soon as possible: ?allergic reactions like skin rash, itching or hives, swelling of the face, lips, or tongue ?breathing problems ?pain, redness, or irritation at site where injected ?signs and symptoms of a dangerous change in heartbeat or heart rhythm like chest pain; dizziness; fast or irregular heartbeat; palpitations; feeling faint or lightheaded, falls; breathing problems ?signs of decreased platelets or bleeding - bruising, pinpoint red spots on the skin, black, tarry stools, blood in the urine ?signs of decreased red blood cells - unusually weak or tired, feeling faint or lightheaded, falls ?signs of infection - fever or chills, cough, sore throat, pain or difficulty passing urine ?signs and symptoms of kidney injury like trouble passing urine or change in the amount of urine ?signs and symptoms of liver injury like dark yellow or brown urine; general ill feeling or flu-like symptoms; light-colored stools; loss of appetite; nausea; right upper belly pain; unusually weak or tired; yellowing of the eyes or skin ?swelling of ankles, feet, hands ?Side effects that usually do not require medical attention (report to your doctor or health care professional if they continue or are bothersome): ?constipation ?diarrhea ?hair loss ?loss of appetite ?nausea ?rash ?vomiting ?This list may not describe all possible side effects. Call your doctor for medical advice about side effects. You may report side effects to FDA at 1-800-FDA-1088. ?Where should I keep my medication? ?This drug is given in a hospital or clinic and will not be stored at home. ?NOTE: This sheet is a summary. It may not cover all possible information. If you have questions about this medicine,  talk to your doctor, pharmacist, or health care provider. ?? 2022 Elsevier/Gold Standard (2017-09-10 00:00:00) ? ?

## 2021-10-04 NOTE — Progress Notes (Signed)
Patient needs transfusion tomorrow. She will proceed with treatment today. Radiation has completed.  ? ?Oncology Nurse Navigator Documentation ? ? ?  10/04/2021  ? 12:45 PM  ?Oncology Nurse Navigator Flowsheets  ?Phase of Treatment Radiation  ?Radiation Actual End Date: 10/05/2021  ?Navigator Follow Up Date: 10/23/2021  ?Navigator Follow Up Reason: Follow-up Appointment;Chemotherapy  ?Navigator Location [facility]  ?Navigator Encounter Type Appt/Treatment Plan Review  ?Patient Visit Type MedOnc  ?Treatment Phase Active Tx  ?Barriers/Navigation Needs Coordination of Care;Education  ?Interventions None Required  ?Acuity Level 2-Minimal Needs (1-2 Barriers Identified)  ?Support Groups/Services Friends and Family  ?Time Spent with Patient 15  ?  ?

## 2021-10-05 ENCOUNTER — Ambulatory Visit: Payer: Medicare PPO

## 2021-10-05 ENCOUNTER — Inpatient Hospital Stay: Payer: Medicare PPO

## 2021-10-05 DIAGNOSIS — C7951 Secondary malignant neoplasm of bone: Secondary | ICD-10-CM

## 2021-10-05 DIAGNOSIS — C679 Malignant neoplasm of bladder, unspecified: Secondary | ICD-10-CM | POA: Diagnosis not present

## 2021-10-05 DIAGNOSIS — Z5111 Encounter for antineoplastic chemotherapy: Secondary | ICD-10-CM | POA: Diagnosis not present

## 2021-10-05 LAB — FERRITIN: Ferritin: 2106 ng/mL — ABNORMAL HIGH (ref 11–307)

## 2021-10-05 MED ORDER — SODIUM CHLORIDE 0.9% FLUSH: 10.0000 mL | INTRAVENOUS | Status: DC | PRN

## 2021-10-05 MED ORDER — DIPHENHYDRAMINE HCL 25 MG PO CAPS
25.0000 mg | ORAL_CAPSULE | Freq: Once | ORAL | Status: AC
  Administered 2021-10-05: 25 mg via ORAL
  Filled 2021-10-05: qty 1

## 2021-10-05 MED ORDER — ACETAMINOPHEN 325 MG PO TABS
650.0000 mg | ORAL_TABLET | Freq: Once | ORAL | Status: AC
  Administered 2021-10-05: 650 mg via ORAL
  Filled 2021-10-05: qty 2

## 2021-10-05 MED ORDER — HEPARIN SOD (PORK) LOCK FLUSH 100 UNIT/ML IV SOLN: 250.0000 [IU] | INTRAVENOUS | Status: DC | PRN

## 2021-10-05 MED ORDER — SODIUM CHLORIDE 0.9% IV SOLUTION: 250.0000 mL | Freq: Once | INTRAVENOUS | Status: DC

## 2021-10-05 NOTE — Patient Instructions (Signed)
Blood Transfusion, Adult A blood transfusion is a procedure in which you receive blood or a type of blood cell (blood component) through an IV. You may need a blood transfusion when your blood level is low. This may result from a bleeding disorder, illness, injury, or surgery. The blood may come from a donor. You may also be able to donate blood for yourself (autologous blood donation) before a planned surgery. The blood given in a transfusion is made up of different blood components. You may receive: Red blood cells. These carry oxygen to the cells in the body. Platelets. These help your blood to clot. Plasma. This is the liquid part of your blood. It carries proteins and other substances throughout the body. White blood cells. These help you fight infections. If you have hemophilia or another clotting disorder, you may also receive other types of blood products. Tell a health care provider about: Any blood disorders you have. Any previous reactions you have had during a blood transfusion. Any allergies you have. All medicines you are taking, including vitamins, herbs, eye drops, creams, and over-the-counter medicines. Any surgeries you have had. Any medical conditions you have, including any recent fever or cold symptoms. Whether you are pregnant or may be pregnant. What are the risks? Generally, this is a safe procedure. However, problems may occur. The most common problems include: A mild allergic reaction, such as red, swollen areas of skin (hives) and itching. Fever or chills. This may be the body's response to new blood cells received. This may occur during or up to 4 hours after the transfusion. More serious problems may include: Transfusion-associated circulatory overload (TACO), or too much fluid in the lungs. This may cause breathing problems. A serious allergic reaction, such as difficulty breathing or swelling around the face and lips. Transfusion-related acute lung injury  (TRALI), which causes breathing difficulty and low oxygen in the blood. This can occur within hours of the transfusion or several days later. Iron overload. This can happen after receiving many blood transfusions over a period of time. Infection or virus being transmitted. This is rare because donated blood is carefully tested before it is given. Hemolytic transfusion reaction. This is rare. It happens when your body's defense system (immune system)tries to attack the new blood cells. Symptoms may include fever, chills, nausea, low blood pressure, and low back or chest pain. Transfusion-associated graft-versus-host disease (TAGVHD). This is rare. It happens when donated cells attack your body's healthy tissues. What happens before the procedure? Medicines Ask your health care provider about: Changing or stopping your regular medicines. This is especially important if you are taking diabetes medicines or blood thinners. Taking medicines such as aspirin and ibuprofen. These medicines can thin your blood. Do not take these medicines unless your health care provider tells you to take them. Taking over-the-counter medicines, vitamins, herbs, and supplements. General instructions Follow instructions from your health care provider about eating and drinking restrictions. You will have a blood test to determine your blood type. This is necessary to know what kind of blood your body will accept and to match it to the donor blood. If you are going to have a planned surgery, you may be able to do an autologous blood donation. This may be done in case you need to have a transfusion. You will have your temperature, blood pressure, and pulse monitored before the transfusion. If you have had an allergic reaction to a transfusion in the past, you may be given medicine to help prevent   a reaction. This medicine may be given to you by mouth (orally) or through an IV. Set aside time for the blood transfusion. This  procedure generally takes 1-4 hours to complete. What happens during the procedure?  An IV will be inserted into one of your veins. The bag of donated blood will be attached to your IV. The blood will then enter through your vein. Your temperature, blood pressure, and pulse will be monitored regularly during the transfusion. This monitoring is done to detect early signs of a transfusion reaction. Tell your nurse right away if you have any of these symptoms during the transfusion: Shortness of breath or trouble breathing. Chest or back pain. Fever or chills. Hives or itching. If you have any signs or symptoms of a reaction, your transfusion will be stopped and you may be given medicine. When the transfusion is complete, your IV will be removed. Pressure may be applied to the IV site for a few minutes. A bandage (dressing)will be applied. The procedure may vary among health care providers and hospitals. What happens after the procedure? Your temperature, blood pressure, pulse, breathing rate, and blood oxygen level will be monitored until you leave the hospital or clinic. Your blood may be tested to see how you are responding to the transfusion. You may be warmed with fluids or blankets to maintain a normal body temperature. If you receive your blood transfusion in an outpatient setting, you will be told whom to contact to report any reactions. Where to find more information For more information on blood transfusions, visit the American [employer]: redcross.org Summary A blood transfusion is a procedure in which you receive blood or a type of blood cell (blood component) through an IV. The blood you receive may come from a donor or be donated by yourself (autologous blood donation) before a planned surgery. The blood given in a transfusion is made up of different blood components. You may receive red blood cells, platelets, plasma, or white blood cells depending on the condition treated. Your  temperature, blood pressure, and pulse will be monitored before, during, and after the transfusion. After the transfusion, your blood may be tested to see how your body has responded. This information is not intended to replace advice given to you by your health care provider. Make sure you discuss any questions you have with your health care provider. Document Revised: 04/22/2019 Document Reviewed: 12/10/2018 Elsevier Patient Education  2022 Elsevier Inc.  

## 2021-10-06 ENCOUNTER — Encounter: Payer: Self-pay | Admitting: Hematology & Oncology

## 2021-10-06 NOTE — Progress Notes (Signed)
?Hematology and Oncology Follow Up Visit ? ?Cheryl Singh ?[id number] ?[birth year] 71 y.o. ?10/06/2021 ? ? ?Principle Diagnosis:  ?Metastatic bladder cancer-liver and bone metastasis ?Iron deficiency anemia ? ?Current Therapy:   ?Status post cycle #1 of chemotherapy with cisplatin/gemcitabine ?Status post radiation therapy to the spine ?Zometa 4 mg IV q. 3 months-next dose on 12/18/2021 ?IV iron-Feraheme given on 09/17/2021 ?    ?Interim History:  Cheryl Singh is back for follow-up.  We first saw her on back in February.  Since then, she has had 1 cycle of chemotherapy with cisplatin/gemcitabine.  She unfortunately was hospitalized because of severe pain.  We finally started radiation therapy on her.  This is helped.  She is still has radiation therapy.  I think she has a couple more days left. ? ?She feels better.  There is not as much pain.  She is a little bit pale today.  Her hemoglobin is down quite a bit.  She does appear to be somewhat dehydrated.  I think that she would probably benefit from 2 units of blood. ? ?She has had no problems with diarrhea.  She has had no vomiting.  There has been no cough or shortness of breath.  She has had no rashes.  There is been no leg swelling. ? ?Overall, I would say performance status is probably ECOG 2. ? ?Medications:  ?Current Outpatient Medications:  ?  atorvastatin (LIPITOR) 10 MG tablet, Take 10 mg by mouth every evening., Disp: , Rfl:  ?  celecoxib (CELEBREX) 200 MG capsule, Take 1 capsule (200 mg total) by mouth 2 (two) times daily., Disp: 60 capsule, Rfl: 4 ?  Cholecalciferol (VITAMIN D3) 50 MCG (2000 UT) capsule, Take 2,000 Units by mouth daily., Disp: , Rfl:  ?  dexamethasone (DECADRON) 4 MG tablet, Take 2 tablets (8 mg total) by mouth daily. Take daily x 3 days starting the day after cisplatin chemotherapy. Take with food., Disp: 30 tablet, Rfl: 1 ?  diltiazem (CARDIZEM) 30 MG tablet, Take 2 tablets (60 mg total) by mouth 3 (three) times daily., Disp: 60  tablet, Rfl: 2 ?  famotidine (PEPCID) 40 MG tablet, Take 1 tablet (40 mg total) by mouth 2 (two) times daily., Disp: 60 tablet, Rfl: 4 ?  FOLIC ACID PO, Take 1 tablet by mouth daily., Disp: , Rfl:  ?  lidocaine-prilocaine (EMLA) cream, Apply to affected area once, Disp: 30 g, Rfl: 3 ?  losartan (COZAAR) 100 MG tablet, Take 100 mg by mouth daily at 6 (six) AM., Disp: , Rfl:  ?  Omega-3 Fatty Acids (FISH OIL) 1000 MG CAPS, Take 1,000 mg by mouth daily., Disp: , Rfl:  ?  ondansetron (ZOFRAN) 8 MG tablet, Take 1 tablet (8 mg total) by mouth 2 (two) times daily as needed. Start on the third day after cisplatin chemotherapy., Disp: 30 tablet, Rfl: 1 ?  polyethylene glycol (MIRALAX / GLYCOLAX) 17 g packet, Take 17 g by mouth daily as needed for mild constipation., Disp: , Rfl:  ?  prochlorperazine (COMPAZINE) 10 MG tablet, Take 1 tablet (10 mg total) by mouth every 6 (six) hours as needed (Nausea or vomiting)., Disp: 30 tablet, Rfl: 1 ? ?Allergies: No Known Allergies ? ?Past Medical History, Surgical history, Social history, and Family History were reviewed and updated. ? ?Review of Systems: ?Review of Systems  ?Constitutional:  Positive for fatigue.  ?HENT:  Negative.    ?Eyes: Negative.   ?Respiratory: Negative.    ?Cardiovascular:  Positive for palpitations.  ?Gastrointestinal:  Positive for nausea.  ?Endocrine: Negative.   ?Genitourinary: Negative.    ?Musculoskeletal:  Positive for back pain.  ?Neurological: Negative.   ?Hematological: Negative.   ?Psychiatric/Behavioral: Negative.    ? ?Physical Exam: ? height is '5\' 4"'$  (1.626 m) and weight is 157 lb (71.2 kg). Her oral temperature is 97.7 ?F (36.5 ?C). Her blood pressure is 106/64 and her pulse is 104 (abnormal). Her respiration is 18 and oxygen saturation is 100%.  ? ?Wt Readings from Last 3 Encounters:  ?10/04/21 157 lb (71.2 kg)  ?10/02/21 157 lb (71.2 kg)  ?09/17/21 167 lb 0.1 oz (75.8 kg)  ? ? ?Physical Exam ?Vitals reviewed.  ?HENT:  ?   Head: Normocephalic and  atraumatic.  ?Eyes:  ?   Pupils: Pupils are equal, round, and reactive to light.  ?Cardiovascular:  ?   Rate and Rhythm: Normal rate and regular rhythm.  ?   Heart sounds: Normal heart sounds.  ?Pulmonary:  ?   Effort: Pulmonary effort is normal.  ?   Breath sounds: Normal breath sounds.  ?Abdominal:  ?   General: Bowel sounds are normal.  ?   Palpations: Abdomen is soft.  ?Musculoskeletal:     ?   General: No tenderness or deformity. Normal range of motion.  ?   Cervical back: Normal range of motion.  ?Lymphadenopathy:  ?   Cervical: No cervical adenopathy.  ?Skin: ?   General: Skin is warm and dry.  ?   Findings: No erythema or rash.  ?Neurological:  ?   Mental Status: She is alert and oriented to person, place, and time.  ?Psychiatric:     ?   Behavior: Behavior normal.     ?   Thought Content: Thought content normal.     ?   Judgment: Judgment normal.  ? ? ? ?Lab Results  ?Component Value Date  ? WBC 3.8 (L) 10/04/2021  ? HGB 9.5 (L) 10/04/2021  ? HCT 28.8 (L) 10/04/2021  ? MCV 93.8 10/04/2021  ? PLT 132 (L) 10/04/2021  ? ?  Chemistry   ?   ?Component Value Date/Time  ? NA 132 (L) 10/04/2021 1133  ? K 3.7 10/04/2021 1133  ? CL 97 (L) 10/04/2021 1133  ? CO2 25 10/04/2021 1133  ? BUN 22 10/04/2021 1133  ? CREATININE 0.72 10/04/2021 1133  ?    ?Component Value Date/Time  ? CALCIUM 8.1 (L) 10/04/2021 1133  ? ALKPHOS 144 (H) 10/04/2021 1133  ? AST 18 10/04/2021 1133  ? ALT 31 10/04/2021 1133  ? BILITOT 0.4 10/04/2021 1133  ?  ? ? ?Impression and Plan: ?Cheryl Singh is a very charming 71 year old white female.  She has metastatic bladder cancer.  Her problem is been the bony pain.  She is getting radiation therapy.  She has couple more days left.  This is helped her. ? ?We will go ahead with her second round of chemotherapy.  Hopefully, this will continue to cause some regression. ? ?I probably would plan for 3 cycles of treatment and then follow-up with a scan and see how everything looks.  I think a PET scan  probably works best for her. ? ?Again we will give her 2 units of blood.  We will set her up for blood on 10/05/2021.  This way, she will be able to have better energy over the Easter holiday.  I think to be able to eat little bit better and have a improved quality of life.  She is in  agreement with transfusion. ? ?I would like to see her back in about 3 weeks.  Again we will have her third cycle of treatment at that point and then we will scan. ? ?Hopefully, she will feel better with the transfusion and with radiation so that she can be more active. ? ? ?Volanda Napoleon, MD ?4/8/20237:36 AM  ?

## 2021-10-07 LAB — BPAM RBC
Blood Product Expiration Date: 202304252359
Blood Product Expiration Date: 202304252359
ISSUE DATE / TIME: 202304070810
ISSUE DATE / TIME: 202304070810
Unit Type and Rh: 6200
Unit Type and Rh: 6200

## 2021-10-07 LAB — TYPE AND SCREEN
ABO/RH(D): AB POS
Antibody Screen: NEGATIVE
Unit division: 0
Unit division: 0

## 2021-10-08 ENCOUNTER — Telehealth: Payer: Self-pay | Admitting: *Deleted

## 2021-10-08 ENCOUNTER — Encounter: Payer: Self-pay | Admitting: Hematology & Oncology

## 2021-10-08 NOTE — Telephone Encounter (Signed)
Received call from patient stating,"I've had terrible gas pains for the last 2-3 days. What can I take to make it go away." Per Gillian Shields, you can take Gas-X up to four times a day. I instructed her to take it at least four times a day for 1-2 days and then call us back to see how she is doing. She verbalized understanding. ?

## 2021-10-10 ENCOUNTER — Encounter: Payer: Self-pay | Admitting: *Deleted

## 2021-10-10 DIAGNOSIS — G893 Neoplasm related pain (acute) (chronic): Secondary | ICD-10-CM

## 2021-10-10 DIAGNOSIS — C7951 Secondary malignant neoplasm of bone: Secondary | ICD-10-CM

## 2021-10-10 NOTE — Progress Notes (Signed)
Patient is calling about moderate to severe abdominal pain. She reports that she developed cramping, sometimes sharp abdominal pain after her blood transfusion which has not improved with gas-ex. She states the pain is focused in her lower abdomen below her umbilicus. She is having loose stool and denies any constipation. The pain comes and goes, but is present pretty consistently. She is mostly staying in bed due to some weakness. She believes she is eating ok, but her appetite is decreased do to pain.  ? ?Spoke to Dr Marin Olp about patient complaints. He would like patient to come in for abdominal films. Order placed. Patient is aware of need for xrays. She doesn't believe she will be able to have the procedure done today, but will go tomorrow.  ? ?Oncology Nurse Navigator Documentation ? ? ?  10/10/2021  ?  9:15 AM  ?Oncology Nurse Navigator Flowsheets  ?Navigator Follow Up Date: 10/23/2021  ?Navigator Follow Up Reason: Follow-up Appointment;Chemotherapy  ?Navigator Location [facility]  ?Navigator Encounter Type Telephone  ?Telephone Symptom Mgt;Incoming Call  ?Patient Visit Type MedOnc  ?Treatment Phase Active Tx  ?Barriers/Navigation Needs Coordination of Care;Education  ?Education Pain/ Symptom Management  ?Interventions Education;Psycho-Social Support  ?Acuity Level 2-Minimal Needs (1-2 Barriers Identified)  ?Education Method Verbal  ?Support Groups/Services Friends and Family  ?Time Spent with Patient 30  ?  ?

## 2021-10-15 ENCOUNTER — Telehealth: Payer: Self-pay | Admitting: *Deleted

## 2021-10-15 ENCOUNTER — Other Ambulatory Visit: Payer: Self-pay | Admitting: *Deleted

## 2021-10-15 MED ORDER — DILTIAZEM HCL 30 MG PO TABS: 60.0000 mg | ORAL_TABLET | Freq: Three times a day (TID) | ORAL | 2 refills | Status: DC

## 2021-10-15 NOTE — Telephone Encounter (Signed)
Patient requesting Cardizem refill.  Called patient to let her know that the last RX wasn't written by Dr Marin Olp.  Patient said was prescribed by hospitalist in hospital.  Dr Marin Olp fine to refill.  Refill sent ?

## 2021-10-17 ENCOUNTER — Ambulatory Visit (HOSPITAL_BASED_OUTPATIENT_CLINIC_OR_DEPARTMENT_OTHER)
Admission: RE | Admit: 2021-10-17 | Discharge: 2021-10-17 | Disposition: A | Discharge status: Home / Self Care | Payer: Medicare PPO | Source: Ambulatory Visit | Attending: Hematology & Oncology | Admitting: Hematology & Oncology

## 2021-10-17 DIAGNOSIS — G893 Neoplasm related pain (acute) (chronic): Secondary | ICD-10-CM | POA: Diagnosis not present

## 2021-10-17 DIAGNOSIS — C7951 Secondary malignant neoplasm of bone: Secondary | ICD-10-CM | POA: Insufficient documentation

## 2021-10-17 DIAGNOSIS — C679 Malignant neoplasm of bladder, unspecified: Secondary | ICD-10-CM | POA: Diagnosis not present

## 2021-10-17 DIAGNOSIS — R109 Unspecified abdominal pain: Secondary | ICD-10-CM | POA: Diagnosis not present

## 2021-10-18 ENCOUNTER — Other Ambulatory Visit: Payer: Self-pay | Admitting: *Deleted

## 2021-10-18 MED ORDER — OXYCODONE-ACETAMINOPHEN 5-325 MG PO TABS: 1.0000 | ORAL_TABLET | ORAL | 0 refills | Status: DC | PRN

## 2021-10-19 ENCOUNTER — Telehealth: Payer: Self-pay | Admitting: *Deleted

## 2021-10-19 ENCOUNTER — Other Ambulatory Visit: Payer: Self-pay | Admitting: *Deleted

## 2021-10-19 DIAGNOSIS — Z515 Encounter for palliative care: Secondary | ICD-10-CM

## 2021-10-19 DIAGNOSIS — C679 Malignant neoplasm of bladder, unspecified: Secondary | ICD-10-CM

## 2021-10-19 DIAGNOSIS — G893 Neoplasm related pain (acute) (chronic): Secondary | ICD-10-CM

## 2021-10-19 MED ORDER — GABAPENTIN 300 MG PO CAPS: 300.0000 mg | ORAL_CAPSULE | Freq: Three times a day (TID) | ORAL | 1 refills | Status: DC

## 2021-10-19 NOTE — Telephone Encounter (Addendum)
Message received from [facility] stating that pt.'s pain to her abdomen remains a 10/10 and would like to know what to do.  Call placed back to [facility] and pt states that she is taking one Percocet every four hours as needed and not taking the [place].  Pt states that she is eating and drinking small amounts with no nausea. She states that she is taking daily Miralax and having small BM's with no difficulties.  Dr. Marin Olp notified.  Call placed back to patient and patient notified per order of Dr. Marin Olp to take two Percocet every four hours as needed for pain, to take Xtampza daily as prescribed, that a new prescription for Neurontin 300 mg three times a day would be sent in to her pharmacy and that the abdominal x-ray shows no blockage.  Teach back done.  Pt is appreciative of call and has no further questions at this time.  ? ?

## 2021-10-23 ENCOUNTER — Inpatient Hospital Stay: Payer: Medicare PPO

## 2021-10-23 ENCOUNTER — Encounter: Payer: Self-pay | Admitting: *Deleted

## 2021-10-23 ENCOUNTER — Inpatient Hospital Stay: Payer: Medicare PPO | Admitting: Hematology & Oncology

## 2021-10-23 ENCOUNTER — Encounter: Payer: Self-pay | Admitting: Hematology & Oncology

## 2021-10-23 ENCOUNTER — Other Ambulatory Visit: Payer: Self-pay

## 2021-10-23 VITALS — BP 149/62 | HR 99 | Temp 98.0°F | Resp 18 | Wt 156.5 lb

## 2021-10-23 DIAGNOSIS — I491 Atrial premature depolarization: Secondary | ICD-10-CM

## 2021-10-23 DIAGNOSIS — I951 Orthostatic hypotension: Secondary | ICD-10-CM

## 2021-10-23 DIAGNOSIS — C7951 Secondary malignant neoplasm of bone: Secondary | ICD-10-CM

## 2021-10-23 DIAGNOSIS — C679 Malignant neoplasm of bladder, unspecified: Secondary | ICD-10-CM

## 2021-10-23 DIAGNOSIS — G893 Neoplasm related pain (acute) (chronic): Secondary | ICD-10-CM

## 2021-10-23 DIAGNOSIS — E878 Other disorders of electrolyte and fluid balance, not elsewhere classified: Secondary | ICD-10-CM

## 2021-10-23 DIAGNOSIS — N3001 Acute cystitis with hematuria: Secondary | ICD-10-CM

## 2021-10-23 DIAGNOSIS — D649 Anemia, unspecified: Secondary | ICD-10-CM

## 2021-10-23 DIAGNOSIS — J9601 Acute respiratory failure with hypoxia: Secondary | ICD-10-CM

## 2021-10-23 DIAGNOSIS — R531 Weakness: Secondary | ICD-10-CM

## 2021-10-23 DIAGNOSIS — Z7189 Other specified counseling: Secondary | ICD-10-CM

## 2021-10-23 DIAGNOSIS — S2232XA Fracture of one rib, left side, initial encounter for closed fracture: Secondary | ICD-10-CM

## 2021-10-23 DIAGNOSIS — Z5111 Encounter for antineoplastic chemotherapy: Secondary | ICD-10-CM | POA: Diagnosis not present

## 2021-10-23 LAB — CBC WITH DIFFERENTIAL (CANCER CENTER ONLY)
Abs Immature Granulocytes: 0 10*3/uL (ref 0.00–0.07)
Basophils Absolute: 0 10*3/uL (ref 0.0–0.1)
Basophils Relative: 0 %
Eosinophils Absolute: 0 10*3/uL (ref 0.0–0.5)
Eosinophils Relative: 0 %
HCT: 23 % — ABNORMAL LOW (ref 36.0–46.0)
Hemoglobin: 7.2 g/dL — ABNORMAL LOW (ref 12.0–15.0)
Lymphocytes Relative: 2 %
Lymphs Abs: 0.2 10*3/uL — ABNORMAL LOW (ref 0.7–4.0)
MCH: 30.5 pg (ref 26.0–34.0)
MCHC: 31.3 g/dL (ref 30.0–36.0)
MCV: 97.5 fL (ref 80.0–100.0)
Monocytes Absolute: 1.7 10*3/uL — ABNORMAL HIGH (ref 0.1–1.0)
Monocytes Relative: 14 %
Neutro Abs: 10.2 10*3/uL — ABNORMAL HIGH (ref 1.7–7.7)
Neutrophils Relative %: 84 %
Platelet Count: 363 10*3/uL (ref 150–400)
RBC: 2.36 MIL/uL — ABNORMAL LOW (ref 3.87–5.11)
RDW: 15.8 % — ABNORMAL HIGH (ref 11.5–15.5)
Smear Review: NORMAL
WBC Count: 12.1 10*3/uL — ABNORMAL HIGH (ref 4.0–10.5)
nRBC: 0.2 % (ref 0.0–0.2)

## 2021-10-23 LAB — IRON AND IRON BINDING CAPACITY (CC-WL,HP ONLY)
Iron: 50 ug/dL (ref 28–170)
Saturation Ratios: 38 % — ABNORMAL HIGH (ref 10.4–31.8)
TIBC: 133 ug/dL — ABNORMAL LOW (ref 250–450)
UIBC: 83 ug/dL — ABNORMAL LOW (ref 148–442)

## 2021-10-23 LAB — RETICULOCYTES
Immature Retic Fract: 37 % — ABNORMAL HIGH (ref 2.3–15.9)
RBC.: 2.35 MIL/uL — ABNORMAL LOW (ref 3.87–5.11)
Retic Count, Absolute: 86.5 10*3/uL (ref 19.0–186.0)
Retic Ct Pct: 3.7 % — ABNORMAL HIGH (ref 0.4–3.1)

## 2021-10-23 LAB — CMP (CANCER CENTER ONLY)
ALT: 14 U/L (ref 0–44)
AST: 10 U/L — ABNORMAL LOW (ref 15–41)
Albumin: 2.6 g/dL — ABNORMAL LOW (ref 3.5–5.0)
Alkaline Phosphatase: 166 U/L — ABNORMAL HIGH (ref 38–126)
Anion gap: 8 (ref 5–15)
BUN: 16 mg/dL (ref 8–23)
CO2: 30 mmol/L (ref 22–32)
Calcium: 8.3 mg/dL — ABNORMAL LOW (ref 8.9–10.3)
Chloride: 95 mmol/L — ABNORMAL LOW (ref 98–111)
Creatinine: 0.7 mg/dL (ref 0.44–1.00)
GFR, Estimated: >60 mL/min (ref >60)
Glucose, Bld: 101 mg/dL — ABNORMAL HIGH (ref 70–99)
Potassium: 4.1 mmol/L (ref 3.5–5.1)
Sodium: 133 mmol/L — ABNORMAL LOW (ref 135–145)
Total Bilirubin: 0.4 mg/dL (ref 0.3–1.2)
Total Protein: 5.6 g/dL — ABNORMAL LOW (ref 6.5–8.1)

## 2021-10-23 LAB — PREPARE RBC (CROSSMATCH)

## 2021-10-23 LAB — LACTATE DEHYDROGENASE: LDH: 173 U/L (ref 98–192)

## 2021-10-23 LAB — SAMPLE TO BLOOD BANK

## 2021-10-23 LAB — FERRITIN: Ferritin: 1295 ng/mL — ABNORMAL HIGH (ref 11–307)

## 2021-10-23 MED ORDER — SODIUM CHLORIDE 0.9 % IV SOLN: Freq: Once | INTRAVENOUS | Status: DC

## 2021-10-23 MED ORDER — MAGNESIUM SULFATE 2 GM/50ML IV SOLN
2.0000 g | Freq: Once | INTRAVENOUS | Status: AC
  Administered 2021-10-23: 2 g via INTRAVENOUS
  Filled 2021-10-23: qty 50

## 2021-10-23 MED ORDER — SODIUM CHLORIDE 0.9 % IV SOLN
100.0000 mg | Freq: Once | INTRAVENOUS | Status: AC
  Administered 2021-10-23: 100 mg via INTRAVENOUS
  Filled 2021-10-23: qty 100

## 2021-10-23 MED ORDER — SODIUM CHLORIDE 0.9 % IV SOLN
150.0000 mg | Freq: Once | INTRAVENOUS | Status: AC
  Administered 2021-10-23: 150 mg via INTRAVENOUS
  Filled 2021-10-23: qty 150

## 2021-10-23 MED ORDER — SODIUM CHLORIDE 0.9 % IV SOLN: INTRAVENOUS | Status: DC

## 2021-10-23 MED ORDER — HEPARIN SOD (PORK) LOCK FLUSH 100 UNIT/ML IV SOLN
500.0000 [IU] | Freq: Once | INTRAVENOUS | Status: AC | PRN
  Administered 2021-10-23: 500 [IU]

## 2021-10-23 MED ORDER — POTASSIUM CHLORIDE IN NACL 20-0.9 MEQ/L-% IV SOLN
Freq: Once | INTRAVENOUS | Status: AC
  Filled 2021-10-23: qty 1000

## 2021-10-23 MED ORDER — SODIUM CHLORIDE 0.9 % IV SOLN
10.0000 mg | Freq: Once | INTRAVENOUS | Status: AC
  Administered 2021-10-23: 10 mg via INTRAVENOUS
  Filled 2021-10-23: qty 10

## 2021-10-23 MED ORDER — SODIUM CHLORIDE 0.9% FLUSH
10.0000 mL | INTRAVENOUS | Status: DC | PRN
  Administered 2021-10-23: 10 mL

## 2021-10-23 MED ORDER — PALONOSETRON HCL INJECTION 0.25 MG/5ML
0.2500 mg | Freq: Once | INTRAVENOUS | Status: AC
  Administered 2021-10-23: 0.25 mg via INTRAVENOUS
  Filled 2021-10-23: qty 5

## 2021-10-23 MED ORDER — SODIUM CHLORIDE 0.9 % IV SOLN
800.0000 mg/m2 | Freq: Once | INTRAVENOUS | Status: AC
  Administered 2021-10-23: 1482 mg via INTRAVENOUS
  Filled 2021-10-23: qty 26.3

## 2021-10-23 NOTE — Progress Notes (Signed)
Patient presenting for her next cycle. She will receive treatment today, however she is anemic and will return Thursday for transfusion.  ? ?She feels like while she still struggles with abdominal pain, she believes it's starting to get better. She asks multiple questions about her medications and possible side effects.  She wonders if her abdominal pain - the feeling of intense gas buildup - can be cause by something she's taking. Reviewed that a few of her medications can cause constipation but she doesn't believe her gas pain is related to constipation. She also states that she isn't taking her anti-emetics at this time.  ? ?Patient asks for prescription for wheelchair. Provided to the patient.  ? ?Patient will need a PET scan prior to her next appointment. Reviewed appointment date, time and location with patient. Also provided her with radiology information sheet.  ? ?Oncology Nurse Navigator Documentation ? ? ?  10/23/2021  ?  8:30 AM  ?Oncology Nurse Navigator Flowsheets  ?Navigator Follow Up Date: 11/13/2021  ?Navigator Follow Up Reason: Follow-up Appointment;Chemotherapy  ?Navigator Location [facility]  ?Navigator Encounter Type Treatment;Appt/Treatment Plan Review  ?Patient Visit Type MedOnc  ?Treatment Phase Active Tx  ?Barriers/Navigation Needs Coordination of Care;Education  ?Education Pain/ Symptom Management  ?Interventions Education;Psycho-Social Support  ?Acuity Level 2-Minimal Needs (1-2 Barriers Identified)  ?Education Method Verbal  ?Support Groups/Services Friends and Family  ?Time Spent with Patient 45  ?  ?

## 2021-10-23 NOTE — Progress Notes (Signed)
?Hematology and Oncology Follow Up Visit ? ?Cheryl Singh ?[id number] ?1950/11/02 71 y.o. ?10/23/2021 ? ? ?Principle Diagnosis:  ?Metastatic bladder cancer-liver and bone metastasis ?Iron deficiency anemia ? ?Current Therapy:   ?Status post cycle #2 of chemotherapy with cisplatin/gemcitabine ?Status post radiation therapy to the spine ?Zometa 4 mg IV q. 3 months-next dose on 12/18/2021 ?IV iron-Feraheme given on 09/17/2021 ?    ?Interim History:  Ms. Cheryl Singh is back for follow-up.  She still having some problems with pain.  This has been some abdominal pain.  We will try to make some adjustments with her medications to try to help.  She says that this is helped a little bit. ? ?She is quite anemic again.  She is not bleeding from what she says.  There is no melena or bright red blood per rectum.  I think we are going to have to transfuse her. ? ?She has completed radiation therapy.  She thinks this may have been somewhat effective for her pain. ? ?She has had no cough.  Her appetite is okay.  Her weight is holding steady. ? ?She has had no problems with diarrhea.  She has had little bit of constipation.  She feels like she has a lot of "gas."  She is on antigas agents.  I told her to try some Pepto-Bismol to see if this may help a little bit. ? ?She has had no bruises.  She has had no fever.  She has had no headache. ? ?Overall, I would have said that her performance status is probably ECOG 1.   ? ? ?Medications:  ?Current Outpatient Medications:  ?  atorvastatin (LIPITOR) 10 MG tablet, Take 10 mg by mouth every evening., Disp: , Rfl:  ?  celecoxib (CELEBREX) 200 MG capsule, Take 1 capsule (200 mg total) by mouth 2 (two) times daily., Disp: 60 capsule, Rfl: 4 ?  Cholecalciferol (VITAMIN D3) 50 MCG (2000 UT) capsule, Take 2,000 Units by mouth daily., Disp: , Rfl:  ?  dexamethasone (DECADRON) 4 MG tablet, Take 2 tablets (8 mg total) by mouth daily. Take daily x 3 days starting the day after cisplatin chemotherapy.  Take with food., Disp: 30 tablet, Rfl: 1 ?  diltiazem (CARDIZEM) 30 MG tablet, Take 2 tablets (60 mg total) by mouth 3 (three) times daily., Disp: 60 tablet, Rfl: 2 ?  famotidine (PEPCID) 40 MG tablet, Take 1 tablet (40 mg total) by mouth 2 (two) times daily., Disp: 60 tablet, Rfl: 4 ?  FOLIC ACID PO, Take 1 tablet by mouth daily., Disp: , Rfl:  ?  lidocaine-prilocaine (EMLA) cream, Apply to affected area once, Disp: 30 g, Rfl: 3 ?  Omega-3 Fatty Acids (FISH OIL) 1000 MG CAPS, Take 1,000 mg by mouth daily., Disp: , Rfl:  ?  ondansetron (ZOFRAN) 8 MG tablet, Take 1 tablet (8 mg total) by mouth 2 (two) times daily as needed. Start on the third day after cisplatin chemotherapy., Disp: 30 tablet, Rfl: 1 ?  oxyCODONE-acetaminophen (PERCOCET/ROXICET) 5-325 MG tablet, Take 1-2 tablets by mouth every 4 (four) hours as needed for severe pain. (Patient not taking: Reported on 10/23/2021), Disp: 30 tablet, Rfl: 0 ?  amLODipine (NORVASC) 5 MG tablet, Take by mouth., Disp: , Rfl:  ?  gabapentin (NEURONTIN) 300 MG capsule, Take 1 capsule (300 mg total) by mouth 3 (three) times daily., Disp: 90 capsule, Rfl: 1 ?  losartan (COZAAR) 100 MG tablet, Take 100 mg by mouth daily at 6 (six) AM., Disp: , Rfl:  ?  polyethylene glycol (MIRALAX / GLYCOLAX) 17 g packet, Take 17 g by mouth daily as needed for mild constipation. (Patient not taking: Reported on 10/23/2021), Disp: , Rfl:  ?  prochlorperazine (COMPAZINE) 10 MG tablet, Take 1 tablet (10 mg total) by mouth every 6 (six) hours as needed (Nausea or vomiting). (Patient not taking: Reported on 10/23/2021), Disp: 30 tablet, Rfl: 1 ?No current facility-administered medications for this visit. ? ?Facility-Administered Medications Ordered in Other Visits:  ?  0.9 %  sodium chloride infusion, , Intravenous, Continuous, Obrien Huskins, Rudell Cobb, MD ?  0.9 % NaCl with KCl 20 mEq/ L  infusion, , Intravenous, Once, Devontae Casasola, Rudell Cobb, MD ?  magnesium sulfate IVPB 2 g 50 mL, 2 g, Intravenous, Once, Alexarae Oliva,  Rudell Cobb, MD ? ?Allergies: No Known Allergies ? ?Past Medical History, Surgical history, Social history, and Family History were reviewed and updated. ? ?Review of Systems: ?Review of Systems  ?Constitutional:  Positive for fatigue.  ?HENT:  Negative.    ?Eyes: Negative.   ?Respiratory: Negative.    ?Cardiovascular:  Positive for palpitations.  ?Gastrointestinal:  Positive for nausea.  ?Endocrine: Negative.   ?Genitourinary: Negative.    ?Musculoskeletal:  Positive for back pain.  ?Neurological: Negative.   ?Hematological: Negative.   ?Psychiatric/Behavioral: Negative.    ? ?Physical Exam: ? weight is 156 lb 8 oz (71 kg). Her oral temperature is 98 ?F (36.7 ?C). Her blood pressure is 149/62 (abnormal) and her pulse is 99. Her respiration is 18 and oxygen saturation is 99%.  ? ?Wt Readings from Last 3 Encounters:  ?10/23/21 156 lb 8 oz (71 kg)  ?10/04/21 157 lb (71.2 kg)  ?10/02/21 157 lb (71.2 kg)  ? ? ?Physical Exam ?Vitals reviewed.  ?HENT:  ?   Head: Normocephalic and atraumatic.  ?Eyes:  ?   Pupils: Pupils are equal, round, and reactive to light.  ?Cardiovascular:  ?   Rate and Rhythm: Normal rate and regular rhythm.  ?   Heart sounds: Normal heart sounds.  ?Pulmonary:  ?   Effort: Pulmonary effort is normal.  ?   Breath sounds: Normal breath sounds.  ?Abdominal:  ?   General: Bowel sounds are normal.  ?   Palpations: Abdomen is soft.  ?Musculoskeletal:     ?   General: No tenderness or deformity. Normal range of motion.  ?   Cervical back: Normal range of motion.  ?Lymphadenopathy:  ?   Cervical: No cervical adenopathy.  ?Skin: ?   General: Skin is warm and dry.  ?   Findings: No erythema or rash.  ?Neurological:  ?   Mental Status: She is alert and oriented to person, place, and time.  ?Psychiatric:     ?   Behavior: Behavior normal.     ?   Thought Content: Thought content normal.     ?   Judgment: Judgment normal.  ? ? ? ?Lab Results  ?Component Value Date  ? WBC 3.8 (L) 10/04/2021  ? HGB 9.5 (L) 10/04/2021   ? HCT 28.8 (L) 10/04/2021  ? MCV 93.8 10/04/2021  ? PLT 132 (L) 10/04/2021  ? ?  Chemistry   ?   ?Component Value Date/Time  ? NA 132 (L) 10/04/2021 1133  ? K 3.7 10/04/2021 1133  ? CL 97 (L) 10/04/2021 1133  ? CO2 25 10/04/2021 1133  ? BUN 22 10/04/2021 1133  ? CREATININE 0.72 10/04/2021 1133  ?    ?Component Value Date/Time  ? CALCIUM 8.1 (L) 10/04/2021 1133  ?  ALKPHOS 144 (H) 10/04/2021 1133  ? AST 18 10/04/2021 1133  ? ALT 31 10/04/2021 1133  ? BILITOT 0.4 10/04/2021 1133  ?  ? ? ?Impression and Plan: ?Ms. Sardinha is a very charming 70 year old white female.  She has metastatic bladder cancer. ? ?We will go ahead with her third cycle of treatment.  After this cycle, we will then plan for another follow-up with a PET scan.  Hopefully, we will see that she is responding. ? ?I have to make some dosage adjustments with the chemotherapy a little bit.  Hopefully this will make it less toxic to her bone marrow and more tolerable. ? ?I would like to believe that the blood transfusion will help her out. ? ?I will plan to get her back to see me in another 3 weeks.  We will get the PET scan in 2 weeks.   ? ? ?Volanda Napoleon, MD ?4/25/20238:43 AM  ?

## 2021-10-23 NOTE — Patient Instructions (Signed)

## 2021-10-25 ENCOUNTER — Inpatient Hospital Stay: Payer: Medicare PPO

## 2021-10-25 DIAGNOSIS — Z5111 Encounter for antineoplastic chemotherapy: Secondary | ICD-10-CM | POA: Diagnosis not present

## 2021-10-25 DIAGNOSIS — C679 Malignant neoplasm of bladder, unspecified: Secondary | ICD-10-CM | POA: Diagnosis not present

## 2021-10-25 DIAGNOSIS — C7951 Secondary malignant neoplasm of bone: Secondary | ICD-10-CM

## 2021-10-25 MED ORDER — SODIUM CHLORIDE 0.9% IV SOLUTION
250.0000 mL | Freq: Once | INTRAVENOUS | Status: AC
  Administered 2021-10-25: 250 mL via INTRAVENOUS

## 2021-10-25 MED ORDER — DIPHENHYDRAMINE HCL 25 MG PO CAPS
25.0000 mg | ORAL_CAPSULE | Freq: Once | ORAL | Status: AC
  Administered 2021-10-25: 25 mg via ORAL
  Filled 2021-10-25: qty 1

## 2021-10-25 MED ORDER — OXYCODONE HCL 5 MG PO TABS
5.0000 mg | ORAL_TABLET | Freq: Once | ORAL | Status: AC
  Administered 2021-10-25: 5 mg via ORAL
  Filled 2021-10-25: qty 1

## 2021-10-25 MED ORDER — SODIUM CHLORIDE 0.9% FLUSH
10.0000 mL | INTRAVENOUS | Status: AC | PRN
  Administered 2021-10-25: 10 mL

## 2021-10-25 MED ORDER — ACETAMINOPHEN 325 MG PO TABS
650.0000 mg | ORAL_TABLET | Freq: Once | ORAL | Status: AC
  Administered 2021-10-25: 650 mg via ORAL
  Filled 2021-10-25: qty 2

## 2021-10-25 MED ORDER — HEPARIN SOD (PORK) LOCK FLUSH 100 UNIT/ML IV SOLN
250.0000 [IU] | INTRAVENOUS | Status: AC | PRN
  Administered 2021-10-25: 500 [IU]

## 2021-10-25 NOTE — Patient Instructions (Signed)
Anemia ? ?Anemia is a condition in which there is not enough red blood cells or hemoglobin in the blood. Hemoglobin is a substance in red blood cells that carries oxygen. ?When you do not have enough red blood cells or hemoglobin (are anemic), your body cannot get enough oxygen and your organs may not work properly. As a result, you may feel very tired or have other problems. ?What are the causes? ?Common causes of anemia include: ?Excessive bleeding. Anemia can be caused by excessive bleeding inside or outside the body, including bleeding from the intestines or from heavy menstrual periods in females. ?Poor nutrition. ?Long-lasting (chronic) kidney, thyroid, and liver disease. ?Bone marrow disorders, spleen problems, and blood disorders. ?Cancer and treatments for cancer. ?HIV (human immunodeficiency virus) and AIDS (acquired immunodeficiency syndrome). ?Infections, medicines, and autoimmune disorders that destroy red blood cells. ?What are the signs or symptoms? ?Symptoms of this condition include: ?Minor weakness. ?Dizziness. ?Headache, or difficulties concentrating and sleeping. ?Heartbeats that feel irregular or faster than normal (palpitations). ?Shortness of breath, especially with exercise. ?Pale skin, lips, and nails, or cold hands and feet. ?Indigestion and nausea. ?Symptoms may occur suddenly or develop slowly. If your anemia is mild, you may not have symptoms. ?How is this diagnosed? ?This condition is diagnosed based on blood tests, your medical history, and a physical exam. In some cases, a test may be needed in which cells are removed from the soft tissue inside of a bone and looked at under a microscope (bone marrow biopsy). Your health care provider may also check your stool (feces) for blood and may do additional testing to look for the cause of your bleeding. ?Other tests may include: ?Imaging tests, such as a CT scan or MRI. ?A procedure to see inside your esophagus and stomach (endoscopy). ?A  procedure to see inside your colon and rectum (colonoscopy). ?How is this treated? ?Treatment for this condition depends on the cause. If you continue to lose a lot of blood, you may need to be treated at a hospital. Treatment may include: ?Taking supplements of iron, vitamin Z30, or folic acid. ?Taking a hormone medicine (erythropoietin) that can help to stimulate red blood cell growth. ?Having a blood transfusion. This may be needed if you lose a lot of blood. ?Making changes to your diet. ?Having surgery to remove your spleen. ?Follow these instructions at home: ?Take over-the-counter and prescription medicines only as told by your health care provider. ?Take supplements only as told by your health care provider. ?Follow any diet instructions that you were given by your health care provider. ?Keep all follow-up visits as told by your health care provider. This is important. ?Contact a health care provider if: ?You develop new bleeding anywhere in the body. ?Get help right away if: ?You are very weak. ?You are short of breath. ?You have pain in your abdomen or chest. ?You are dizzy or feel faint. ?You have trouble concentrating. ?You have bloody stools, black stools, or tarry stools. ?You vomit repeatedly or you vomit up blood. ?These symptoms may represent a serious problem that is an emergency. Do not wait to see if the symptoms will go away. Get medical help right away. Call your local emergency services (911 in the [place] Do not drive yourself to the hospital. ?Summary ?Anemia is a condition in which you do not have enough red blood cells or enough of a substance in your red blood cells that carries oxygen (hemoglobin). ?Symptoms may occur suddenly or develop slowly. ?If your anemia  is mild, you may not have symptoms. ?This condition is diagnosed with blood tests, a medical history, and a physical exam. Other tests may be needed. ?Treatment for this condition depends on the cause of the anemia. ?This  information is not intended to replace advice given to you by your health care provider. Make sure you discuss any questions you have with your health care provider. ?Document Revised: 05/01/2021 Document Reviewed: 05/25/2019 ?Elsevier Patient Education ? [employer]. ? ?

## 2021-10-26 ENCOUNTER — Other Ambulatory Visit: Payer: Self-pay | Admitting: *Deleted

## 2021-10-26 LAB — BPAM RBC
Blood Product Expiration Date: 202305192359
Blood Product Expiration Date: 202305192359
ISSUE DATE / TIME: 202304270814
ISSUE DATE / TIME: 202304270814
Unit Type and Rh: 6200
Unit Type and Rh: 6200

## 2021-10-26 LAB — TYPE AND SCREEN
ABO/RH(D): AB POS
Antibody Screen: NEGATIVE
Unit division: 0
Unit division: 0

## 2021-10-26 MED ORDER — OXYCODONE-ACETAMINOPHEN 5-325 MG PO TABS
1.0000 | ORAL_TABLET | ORAL | 0 refills | Status: DC | PRN
Start: 2021-10-26 — End: 2021-11-23

## 2021-10-30 ENCOUNTER — Inpatient Hospital Stay: Payer: Medicare PPO

## 2021-10-30 ENCOUNTER — Telehealth: Payer: Self-pay

## 2021-10-30 ENCOUNTER — Inpatient Hospital Stay: Payer: Medicare PPO | Attending: Family Medicine

## 2021-10-30 DIAGNOSIS — C679 Malignant neoplasm of bladder, unspecified: Secondary | ICD-10-CM | POA: Insufficient documentation

## 2021-10-30 DIAGNOSIS — D509 Iron deficiency anemia, unspecified: Secondary | ICD-10-CM | POA: Insufficient documentation

## 2021-10-30 DIAGNOSIS — C787 Secondary malignant neoplasm of liver and intrahepatic bile duct: Secondary | ICD-10-CM

## 2021-10-30 DIAGNOSIS — C7951 Secondary malignant neoplasm of bone: Secondary | ICD-10-CM | POA: Insufficient documentation

## 2021-10-30 DIAGNOSIS — E876 Hypokalemia: Secondary | ICD-10-CM

## 2021-10-30 DIAGNOSIS — R1030 Lower abdominal pain, unspecified: Secondary | ICD-10-CM

## 2021-10-30 DIAGNOSIS — Z5111 Encounter for antineoplastic chemotherapy: Secondary | ICD-10-CM | POA: Insufficient documentation

## 2021-10-30 DIAGNOSIS — E878 Other disorders of electrolyte and fluid balance, not elsewhere classified: Secondary | ICD-10-CM

## 2021-10-30 LAB — CBC WITH DIFFERENTIAL (CANCER CENTER ONLY)
Abs Immature Granulocytes: 0.02 10*3/uL (ref 0.00–0.07)
Basophils Absolute: 0 10*3/uL (ref 0.0–0.1)
Basophils Relative: 1 %
Eosinophils Absolute: 0 10*3/uL (ref 0.0–0.5)
Eosinophils Relative: 0 %
HCT: 33.3 % — ABNORMAL LOW (ref 36.0–46.0)
Hemoglobin: 11.5 g/dL — ABNORMAL LOW (ref 12.0–15.0)
Immature Granulocytes: 1 %
Lymphocytes Relative: 6 %
Lymphs Abs: 0.2 10*3/uL — ABNORMAL LOW (ref 0.7–4.0)
MCH: 30.4 pg (ref 26.0–34.0)
MCHC: 34.5 g/dL (ref 30.0–36.0)
MCV: 88.1 fL (ref 80.0–100.0)
Monocytes Absolute: 0.1 10*3/uL (ref 0.1–1.0)
Monocytes Relative: 3 %
Neutro Abs: 3.6 10*3/uL (ref 1.7–7.7)
Neutrophils Relative %: 89 %
Platelet Count: 195 10*3/uL (ref 150–400)
RBC: 3.78 MIL/uL — ABNORMAL LOW (ref 3.87–5.11)
RDW: 14.7 % (ref 11.5–15.5)
WBC Count: 4 10*3/uL (ref 4.0–10.5)
nRBC: 0 % (ref 0.0–0.2)

## 2021-10-30 LAB — CMP (CANCER CENTER ONLY)
ALT: 20 U/L (ref 0–44)
AST: 14 U/L — ABNORMAL LOW (ref 15–41)
Albumin: 3 g/dL — ABNORMAL LOW (ref 3.5–5.0)
Alkaline Phosphatase: 141 U/L — ABNORMAL HIGH (ref 38–126)
Anion gap: 13 (ref 5–15)
BUN: 23 mg/dL (ref 8–23)
CO2: 31 mmol/L (ref 22–32)
Calcium: 7.9 mg/dL — ABNORMAL LOW (ref 8.9–10.3)
Chloride: 87 mmol/L — ABNORMAL LOW (ref 98–111)
Creatinine: 1.01 mg/dL — ABNORMAL HIGH (ref 0.44–1.00)
GFR, Estimated: >60 mL/min (ref >60)
Glucose, Bld: 102 mg/dL — ABNORMAL HIGH (ref 70–99)
Potassium: 3 mmol/L — ABNORMAL LOW (ref 3.5–5.1)
Sodium: 131 mmol/L — ABNORMAL LOW (ref 135–145)
Total Bilirubin: 0.8 mg/dL (ref 0.3–1.2)
Total Protein: 5.9 g/dL — ABNORMAL LOW (ref 6.5–8.1)

## 2021-10-30 MED ORDER — SODIUM CHLORIDE 0.9 % IV SOLN: Freq: Once | INTRAVENOUS | Status: DC

## 2021-10-30 MED ORDER — PROCHLORPERAZINE MALEATE 10 MG PO TABS
10.0000 mg | ORAL_TABLET | Freq: Once | ORAL | Status: AC
  Administered 2021-10-30: 10 mg via ORAL
  Filled 2021-10-30: qty 1

## 2021-10-30 MED ORDER — SODIUM CHLORIDE 0.9 % IV SOLN: Freq: Once | INTRAVENOUS | Status: AC

## 2021-10-30 MED ORDER — POTASSIUM CHLORIDE CRYS ER 20 MEQ PO TBCR: 20.0000 meq | EXTENDED_RELEASE_TABLET | Freq: Every day | ORAL | 1 refills | Status: DC

## 2021-10-30 MED ORDER — POTASSIUM CHLORIDE CRYS ER 20 MEQ PO TBCR
40.0000 meq | EXTENDED_RELEASE_TABLET | Freq: Once | ORAL | Status: AC
  Administered 2021-10-30: 40 meq via ORAL
  Filled 2021-10-30: qty 2

## 2021-10-30 MED ORDER — HEPARIN SOD (PORK) LOCK FLUSH 100 UNIT/ML IV SOLN
500.0000 [IU] | Freq: Once | INTRAVENOUS | Status: AC | PRN
  Administered 2021-10-30: 500 [IU]

## 2021-10-30 MED ORDER — SODIUM CHLORIDE 0.9% FLUSH
10.0000 mL | INTRAVENOUS | Status: DC | PRN
  Administered 2021-10-30: 10 mL

## 2021-10-30 MED ORDER — OXYCODONE HCL 5 MG PO TABS
5.0000 mg | ORAL_TABLET | Freq: Once | ORAL | Status: AC
  Administered 2021-10-30: 5 mg via ORAL
  Filled 2021-10-30: qty 1

## 2021-10-30 MED ORDER — COLD PACK MISC ONCOLOGY: 1.0000 | Freq: Once | Status: DC | PRN

## 2021-10-30 MED ORDER — SODIUM CHLORIDE 0.9 % IV SOLN
800.0000 mg/m2 | Freq: Once | INTRAVENOUS | Status: AC
  Administered 2021-10-30: 1482 mg via INTRAVENOUS
  Filled 2021-10-30: qty 26.3

## 2021-10-30 NOTE — Telephone Encounter (Signed)
Pt advised while in office and potassium and IV fluids given. Rx sent to requested pharmacy. ?

## 2021-10-30 NOTE — Patient Instructions (Signed)
[facility]  Discharge Instructions: Thank you for choosing [facility] to provide your oncology and hematology care.   If you have a lab appointment with the Cancer Center, please go directly to the Cancer Center and check in at the registration area.  Wear comfortable clothing and clothing appropriate for easy access to any Portacath or PICC line.   We strive to give you quality time with your provider. You may need to reschedule your appointment if you arrive late (15 or more minutes).  Arriving late affects you and other patients whose appointments are after yours.  Also, if you miss three or more appointments without notifying the office, you may be dismissed from the clinic at the provider's discretion.      For prescription refill requests, have your pharmacy contact our office and allow 72 hours for refills to be completed.    Today you received the following chemotherapy and/or immunotherapy agents Gemzar       To help prevent nausea and vomiting after your treatment, we encourage you to take your nausea medication as directed.  BELOW ARE SYMPTOMS THAT SHOULD BE REPORTED IMMEDIATELY: *FEVER GREATER THAN 100.4 F (38 C) OR HIGHER *CHILLS OR SWEATING *NAUSEA AND VOMITING THAT IS NOT CONTROLLED WITH YOUR NAUSEA MEDICATION *UNUSUAL SHORTNESS OF BREATH *UNUSUAL BRUISING OR BLEEDING *URINARY PROBLEMS (pain or burning when urinating, or frequent urination) *BOWEL PROBLEMS (unusual diarrhea, constipation, pain near the anus) TENDERNESS IN MOUTH AND THROAT WITH OR WITHOUT PRESENCE OF ULCERS (sore throat, sores in mouth, or a toothache) UNUSUAL RASH, SWELLING OR PAIN  UNUSUAL VAGINAL DISCHARGE OR ITCHING   Items with * indicate a potential emergency and should be followed up as soon as possible or go to the Emergency Department if any problems should occur.  Please show the CHEMOTHERAPY ALERT CARD or IMMUNOTHERAPY ALERT CARD at check-in to the  Emergency Department and triage nurse. Should you have questions after your visit or need to cancel or reschedule your appointment, please contact [facility]  [phone] and follow the prompts.  Office hours are 8:00 a.m. to 4:30 p.m. Monday - Friday. Please note that voicemails left after 4:00 p.m. may not be returned until the following business day.  We are closed weekends and major holidays. You have access to a nurse at all times for urgent questions. Please call the main number to the clinic [phone] and follow the prompts.  For any non-urgent questions, you may also contact your provider using MyChart. We now offer e-Visits for anyone 18 and older to request care online for non-urgent symptoms. For details visit [email or web address].   Also download the MyChart app! Go to the app store, search "MyChart", open the app, select , and log in with your MyChart username and password.  Due to Covid, a mask is required upon entering the hospital/clinic. If you do not have a mask, one will be given to you upon arrival. For doctor visits, patients may have 1 support person aged 18 or older with them. For treatment visits, patients cannot have anyone with them due to current Covid guidelines and our immunocompromised population.  

## 2021-10-30 NOTE — Progress Notes (Signed)
Patient c/o abdominal pain that she rates 5 on a 1-10 scale.  She asked for oxycodone. OK to give her one tablet per Dr. Marin Olp. ?

## 2021-10-30 NOTE — Telephone Encounter (Signed)
-----   Message from Volanda Napoleon, MD sent at 10/30/2021  1:01 PM EDT ----- ?She needs some oral potassium.  Please give her 40 mEq now.  Then, call and 20 mEq a day.  She also needs IV fluids with normal saline. ?

## 2021-10-30 NOTE — Patient Instructions (Signed)

## 2021-10-31 ENCOUNTER — Other Ambulatory Visit: Payer: Self-pay | Admitting: *Deleted

## 2021-10-31 MED ORDER — DILTIAZEM HCL 30 MG PO TABS: 60.0000 mg | ORAL_TABLET | Freq: Three times a day (TID) | ORAL | 2 refills | Status: DC

## 2021-10-31 NOTE — Progress Notes (Incomplete)
?  Radiation Oncology         [phone] ?________________________________ ? ?Patient Name: Cheryl Singh ?MRN: [id number] ?DOB: 1950/09/22 ?Referring Physician: Burney Gauze (Profile Not Attached) ?Date of Service: 10/02/2021 ?[facility] [place] ? ?                                                      End Of Treatment Note ? 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 ?  ?Metastatic stage IV bladder cancer  ? ?Intent: Palliative ? ?Radiation Treatment Dates: 09/18/2021 through 10/02/2021 ?Site Technique Total Dose (Gy) Dose per Fx (Gy) Completed Fx Beam Energies  ?Lumbar Spine: Spine 3D 30/30 3 10/10 10X, 15X  ?Ribs, Left: Chest_L 3D 28/28 3.5 8/8 6X, 10X  ? ?Narrative: The patient tolerated radiation therapy relatively well. During her final weekly treatment check on 09/25/21, the patient reported left hip and lower back pain, mild fatigue, and mild itching. Overall, her pain improved significantly with treatment and she reported an easier time with ambulation. She continues to use her walker for safety issues.  ? ?Plan: The patient will follow-up with radiation oncology in one month . ? ?________________________________________________ ?----------------------------------- ? ?Blair Promise, PhD, MD ? ?This document serves as a record of services personally performed by Gery Pray, MD. It was created on his behalf by Roney Mans, a trained medical scribe. The creation of this record is based on the scribe's personal observations and the provider's statements to them. This document has been checked and approved by the attending provider. ? ?

## 2021-10-31 NOTE — Progress Notes (Signed)
?Radiation Oncology         [phone] ?________________________________ ? ?Name: ARONDA BURFORD MRN: [id number]  ?Date: 11/01/2021  DOB: [birth year] ? ?Follow-Up Visit Note ? ?CC: Glenis Smoker, MD  Volanda Napoleon, MD ? ?  ICD-10-CM   ?1. 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 C79.51   ?  ?2. 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 C77.5   ?  ? ? 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 ?  ?Metastatic stage IV bladder cancer  ? ?Interval Since Last Radiation: 1 month ? ?Intent: Palliative ? ?Radiation Treatment Dates: 09/18/2021 through 10/02/2021 ?Site Technique Total Dose (Gy) Dose per Fx (Gy) Completed Fx Beam Energies  ?Lumbar Spine: Spine 3D 30/30 3 10/10 10X, 15X  ?Ribs, Left: Chest_L 3D 28/28 3.5 8/8 6X, 10X  ? ? ?Narrative:  The patient returns today for routine follow-up. The patient tolerated radiation therapy relatively well. During her final weekly treatment check on 09/25/21, the patient reported left hip and lower back pain, mild fatigue, and mild itching. Overall, her pain improved significantly with treatment and she reported an easier time with ambulation. She continues to use her walker for safety issues.   ? ?On the date of her final treatment, the patient also presented to the pain clinic to discuss symptom management. The patient was noted to not be taking anything for long-acting pain relief, and was only taking percocet every 4-6 hours as needed for breakthrough pain. She previously used Xtampza but stopped due to side effects. She tolerated OxyContin well in the past but her insurance did not cover Rx at discharge. Given so, the pain clinic may call and appeal to try and get her back on OxyContin, though the patient expressed interest in trying Celebrex in the mean time.   ? ?In the interval, the patient has also completed 3 cycles of chemotherapy consisting of cisplatin/gemcitabine from  10/04/21 through 10/23/21 under the care of Dr. Marin Olp. During systemic treatment, the patient struggled with anemia, abdominal pain, and ongoing bony pain. For her anemia, she was given blood transfusions which seemed to help her, though this was a recurrent issue noted prior to each infusion. Dr. Marin Olp also adjusted dosing for her 3rd cycle to reduce toxicity to her bone marrow.      ?-- Abdominal x-ray for evaluation of abdominal pain ordered by Dr. Marin Olp on 10/17/21 showed no pneumoperitoneum or blockages, and a nonobstructive bowel gas pattern.  ? ?Of note: the patient is schedule for a restaging PET on 11/09/21.            ? ?She reports overall improvement in her pain in the left rib cage and lower back area where we treated.  She continues to have problems with a lot of abdominal bloating.  She has been taking Gas-X which I recommend she continue.  She is not eating very well and primarily has a liquid diet so I recommend she try to increase some solids in her diet to see if this will help some with her loose stools.  We discussed the some of this may be possibly related to her lumbar spine radiation therapy and should improve with time.   ? ?She reports her abdominal pain goes completely away when lying down but does get worse with standing.  She reports this is located primarily in the lower abdominal region near the pelvis.  She denies any  nausea.       ? ?Allergies:  has No Known Allergies. ? ?Meds: ?Current Outpatient Medications  ?Medication Sig Dispense Refill  ? atorvastatin (LIPITOR) 10 MG tablet Take 10 mg by mouth every evening.    ? celecoxib (CELEBREX) 200 MG capsule Take 1 capsule (200 mg total) by mouth 2 (two) times daily. 60 capsule 4  ? diltiazem (CARDIZEM) 30 MG tablet Take 2 tablets (60 mg total) by mouth 3 (three) times daily. 180 tablet 2  ? famotidine (PEPCID) 40 MG tablet Take 1 tablet (40 mg total) by mouth 2 (two) times daily. 60 tablet 4  ? Multiple Vitamins-Minerals  (MULTIVITAMIN WITH MINERALS) tablet Take 1 tablet by mouth daily.    ? ondansetron (ZOFRAN) 8 MG tablet Take 1 tablet (8 mg total) by mouth 2 (two) times daily as needed. Start on the third day after cisplatin chemotherapy. 30 tablet 1  ? oxyCODONE-acetaminophen (PERCOCET/ROXICET) 5-325 MG tablet Take 1-2 tablets by mouth every 4 (four) hours as needed for severe pain. 60 tablet 0  ? polyethylene glycol (MIRALAX / GLYCOLAX) 17 g packet Take 17 g by mouth daily as needed for mild constipation.    ? potassium chloride SA (KLOR-CON M) 20 MEQ tablet Take 1 tablet (20 mEq total) by mouth daily. 30 tablet 1  ? Simethicone (GAS RELIEF) 250 MG CAPS Take 1 capsule by mouth.    ? amLODipine (NORVASC) 5 MG tablet Take by mouth. (Patient not taking: Reported on 11/01/2021)    ? Cholecalciferol (VITAMIN D3) 50 MCG (2000 UT) capsule Take 2,000 Units by mouth daily. (Patient not taking: Reported on 11/01/2021)    ? dexamethasone (DECADRON) 4 MG tablet Take 2 tablets (8 mg total) by mouth daily. Take daily x 3 days starting the day after cisplatin chemotherapy. Take with food. (Patient not taking: Reported on 11/01/2021) 30 tablet 1  ? FOLIC ACID PO Take 1 tablet by mouth daily. (Patient not taking: Reported on 11/01/2021)    ? gabapentin (NEURONTIN) 300 MG capsule Take 1 capsule (300 mg total) by mouth 3 (three) times daily. (Patient not taking: Reported on 11/01/2021) 90 capsule 1  ? lidocaine-prilocaine (EMLA) cream Apply to affected area once 30 g 3  ? losartan (COZAAR) 100 MG tablet Take 100 mg by mouth daily at 6 (six) AM. (Patient not taking: Reported on 11/01/2021)    ? Omega-3 Fatty Acids (FISH OIL) 1000 MG CAPS Take 1,000 mg by mouth daily. (Patient not taking: Reported on 11/01/2021)    ? prochlorperazine (COMPAZINE) 10 MG tablet Take 1 tablet (10 mg total) by mouth every 6 (six) hours as needed (Nausea or vomiting). (Patient not taking: Reported on 10/23/2021) 30 tablet 1  ? ?No current facility-administered medications for this  encounter.  ? ? ?Physical Findings: ?The patient is in no acute distress. Patient is alert and oriented. ? height is 5' 4.5" (1.638 m) and weight is 148 lb 3.2 oz (67.2 kg). Her temperature is 97.2 ?F (36.2 ?C) (abnormal). Her blood pressure is 116/66 and her pulse is 83. Her respiration is 20 and oxygen saturation is 99%. .   Lungs are clear to auscultation bilaterally. Heart has regular rate and rhythm. No palpable cervical, supraclavicular, or axillary adenopathy. Abdomen soft, non-tender, normal bowel sounds.  Lower motor strength is 5 out of 5 in the proximal and distal muscle groups.  She is getting up and walking a little bit primarily with her walker.   ? ? ?Lab Findings: ?Lab Results  ?Component  Value Date  ? WBC 4.0 10/30/2021  ? HGB 11.5 (L) 10/30/2021  ? HCT 33.3 (L) 10/30/2021  ? MCV 88.1 10/30/2021  ? PLT 195 10/30/2021  ? ? ?Radiographic Findings: ?DG Abd 2 Views ? ?Result Date: 10/19/2021 ?CLINICAL DATA:  71 year old female with abdominal pain after radiation treatment. EXAM: ABDOMEN - 2 VIEW COMPARISON:  08/02/2021 FINDINGS: The bowel gas pattern is normal. There is no evidence of free air. No radio-opaque calculi or other significant radiographic abnormality is seen. Similar sigmoid curvature of the thoracolumbar spine. IMPRESSION: Nonobstructive bowel gas pattern.  No pneumoperitoneum. Electronically Signed   By: Ruthann Cancer M.D.   On: 10/19/2021 09:14   ? 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 ?  ?Metastatic stage IV bladder cancer  ? ?The patient is recovering from the effects of radiation.  Ongoing abdominal pain which is worsened with standing but completely resolves when lying down.  She denies any nausea at this time or diarrhea.  Her pain in the left rib cage and lumbar spine area has improved significantly since completion of her palliative radiation therapy.  She does report some pain along the right lower SI joint region and I discussed with  her that the PET scan may shed some light on why this is happening. ? ?Plan: As needed follow-up in radiation oncology.  She will proceed with a PET scan next week and see Dr. Marin Olp for review of this study. ? ? ? ?___

## 2021-11-01 ENCOUNTER — Encounter: Payer: Self-pay | Admitting: Radiation Oncology

## 2021-11-01 ENCOUNTER — Other Ambulatory Visit: Payer: Self-pay

## 2021-11-01 ENCOUNTER — Encounter: Payer: Medicare PPO | Admitting: Nurse Practitioner

## 2021-11-01 ENCOUNTER — Ambulatory Visit
Admission: RE | Admit: 2021-11-01 | Discharge: 2021-11-01 | Disposition: A | Discharge status: Home / Self Care | Payer: Medicare PPO | Source: Ambulatory Visit | Attending: Radiation Oncology | Admitting: Radiation Oncology

## 2021-11-01 VITALS — BP 116/66 | HR 83 | Temp 97.2°F | Resp 20 | Ht 64.5 in | Wt 148.2 lb

## 2021-11-01 DIAGNOSIS — C679 Malignant neoplasm of bladder, unspecified: Secondary | ICD-10-CM | POA: Diagnosis not present

## 2021-11-01 DIAGNOSIS — C7951 Secondary malignant neoplasm of bone: Secondary | ICD-10-CM | POA: Diagnosis not present

## 2021-11-01 DIAGNOSIS — C775 Secondary and unspecified malignant neoplasm of intrapelvic lymph nodes: Secondary | ICD-10-CM | POA: Diagnosis not present

## 2021-11-01 NOTE — Progress Notes (Signed)
Cheryl Singh is here today for follow up post radiation to the lung. ? ?Left chest ? ?Does the patient complain of any of the following: ?Pain:Patient reports having some abdominal pain and low back pain. Patient taking oxycodone and Celebrex for pain. Reports increased abdominal bloating . ?Shortness of breath w/wo exertion: No ?Cough: No ?Hemoptysis: No ?Pain with swallowing: No ?Swallowing/choking concerns: No ?Appetite: Fair, Patient drinking 2-3 daily. ?Wt Readings from Last 3 Encounters:  ?11/01/21 148 lb 3.2 oz (67.2 kg)  ?10/23/21 156 lb 8 oz (71 kg)  ?10/04/21 157 lb (71.2 kg)  ?  ?Energy Level: Continues to have a low energy level.  ?Post radiation skin Changes: No ? ?Patient denies gait changes. Reports issues with balance.  ? ? ? ? ? ? ?Additional comments if applicable:  ? ?Vitals:  ? 11/01/21 1141  ?BP: 116/66  ?Pulse: 83  ?Resp: 20  ?Temp: (!) 97.2 ?F (36.2 ?C)  ?SpO2: 99%  ?Weight: 148 lb 3.2 oz (67.2 kg)  ?Height: 5' 4.5" (1.638 m)  ?  ?

## 2021-11-05 ENCOUNTER — Other Ambulatory Visit: Payer: Self-pay | Admitting: Hematology & Oncology

## 2021-11-05 ENCOUNTER — Encounter: Payer: Self-pay | Admitting: *Deleted

## 2021-11-05 DIAGNOSIS — R1011 Right upper quadrant pain: Secondary | ICD-10-CM

## 2021-11-05 NOTE — Progress Notes (Signed)
Received a call from patient's caregiver, [name]. She continues to be concerned with the continuous abdominal pain which leads to limited patient mobility and decreased intake. She hasn't been able to come to an appointment to speak to Dr Marin Olp in several months. She is requesting that Dr Marin Olp call her when he has an opportunity so that she can discuss her concerns with him. She understands that Dr Marin Olp will likely not be able to return this request until later today after he is done seeing patients.  ? ?This message is given to Dr Marin Olp.  ? ?Oncology Nurse Navigator Documentation ? ? ?  11/05/2021  ?  8:45 AM  ?Oncology Nurse Navigator Flowsheets  ?Navigator Follow Up Date: 11/09/2021  ?Navigator Follow Up Reason: Scan Review  ?Navigator Location [facility]  ?Navigator Encounter Type Telephone  ?Telephone Symptom Mgt;Incoming Call  ?Patient Visit Type MedOnc  ?Treatment Phase Active Tx  ?Barriers/Navigation Needs Coordination of Care;Education  ?Education Pain/ Symptom Management  ?Interventions Education;Psycho-Social Support  ?Acuity Level 2-Minimal Needs (1-2 Barriers Identified)  ?Education Method Verbal  ?Support Groups/Services Friends and Family  ?Time Spent with Patient 15  ?  ?

## 2021-11-07 ENCOUNTER — Other Ambulatory Visit: Payer: Self-pay

## 2021-11-07 DIAGNOSIS — C7951 Secondary malignant neoplasm of bone: Secondary | ICD-10-CM

## 2021-11-07 DIAGNOSIS — R1011 Right upper quadrant pain: Secondary | ICD-10-CM

## 2021-11-07 NOTE — Progress Notes (Signed)
Pts caregiver [name] called asking if we could get a pain patch ordered for Cheryl Singh, that she is in constant pain and wanted to know if they thought it would help. Discussed with Dr.Ennever who gave verbal order for fentanyl patches. Order placed and called and left message with [name] with information.  ?

## 2021-11-08 ENCOUNTER — Encounter: Payer: Self-pay | Admitting: Hematology & Oncology

## 2021-11-08 MED ORDER — FENTANYL 25 MCG/HR TD PT72: 1.0000 | MEDICATED_PATCH | TRANSDERMAL | 0 refills | Status: DC

## 2021-11-09 ENCOUNTER — Inpatient Hospital Stay (HOSPITAL_COMMUNITY)
Admission: RE | Admit: 2021-11-09 | Discharge: 2021-11-09 | Disposition: A | Discharge status: Home / Self Care | Payer: Medicare PPO | Source: Ambulatory Visit | Attending: Hematology & Oncology | Admitting: Hematology & Oncology

## 2021-11-09 DIAGNOSIS — E871 Hypo-osmolality and hyponatremia: Secondary | ICD-10-CM | POA: Diagnosis present

## 2021-11-09 DIAGNOSIS — D649 Anemia, unspecified: Secondary | ICD-10-CM | POA: Diagnosis not present

## 2021-11-09 DIAGNOSIS — Z79899 Other long term (current) drug therapy: Secondary | ICD-10-CM | POA: Diagnosis not present

## 2021-11-09 DIAGNOSIS — N390 Urinary tract infection, site not specified: Secondary | ICD-10-CM | POA: Diagnosis present

## 2021-11-09 DIAGNOSIS — R1032 Left lower quadrant pain: Secondary | ICD-10-CM | POA: Diagnosis present

## 2021-11-09 DIAGNOSIS — C775 Secondary and unspecified malignant neoplasm of intrapelvic lymph nodes: Secondary | ICD-10-CM | POA: Diagnosis present

## 2021-11-09 DIAGNOSIS — C787 Secondary malignant neoplasm of liver and intrahepatic bile duct: Secondary | ICD-10-CM | POA: Diagnosis present

## 2021-11-09 DIAGNOSIS — K6389 Other specified diseases of intestine: Secondary | ICD-10-CM | POA: Diagnosis not present

## 2021-11-09 DIAGNOSIS — N3289 Other specified disorders of bladder: Secondary | ICD-10-CM | POA: Diagnosis not present

## 2021-11-09 DIAGNOSIS — J9601 Acute respiratory failure with hypoxia: Secondary | ICD-10-CM | POA: Diagnosis present

## 2021-11-09 DIAGNOSIS — C679 Malignant neoplasm of bladder, unspecified: Secondary | ICD-10-CM | POA: Insufficient documentation

## 2021-11-09 DIAGNOSIS — Z6825 Body mass index (BMI) 25.0-25.9, adult: Secondary | ICD-10-CM | POA: Diagnosis not present

## 2021-11-09 DIAGNOSIS — K5792 Diverticulitis of intestine, part unspecified, without perforation or abscess without bleeding: Secondary | ICD-10-CM | POA: Diagnosis not present

## 2021-11-09 DIAGNOSIS — R109 Unspecified abdominal pain: Secondary | ICD-10-CM | POA: Diagnosis not present

## 2021-11-09 DIAGNOSIS — F17210 Nicotine dependence, cigarettes, uncomplicated: Secondary | ICD-10-CM | POA: Diagnosis present

## 2021-11-09 DIAGNOSIS — K219 Gastro-esophageal reflux disease without esophagitis: Secondary | ICD-10-CM | POA: Diagnosis present

## 2021-11-09 DIAGNOSIS — E440 Moderate protein-calorie malnutrition: Secondary | ICD-10-CM | POA: Diagnosis present

## 2021-11-09 DIAGNOSIS — I10 Essential (primary) hypertension: Secondary | ICD-10-CM | POA: Diagnosis not present

## 2021-11-09 DIAGNOSIS — I700 Atherosclerosis of aorta: Secondary | ICD-10-CM | POA: Diagnosis not present

## 2021-11-09 DIAGNOSIS — D509 Iron deficiency anemia, unspecified: Secondary | ICD-10-CM | POA: Diagnosis present

## 2021-11-09 DIAGNOSIS — E7849 Other hyperlipidemia: Secondary | ICD-10-CM | POA: Diagnosis not present

## 2021-11-09 DIAGNOSIS — E876 Hypokalemia: Secondary | ICD-10-CM | POA: Diagnosis present

## 2021-11-09 DIAGNOSIS — R0902 Hypoxemia: Secondary | ICD-10-CM | POA: Diagnosis not present

## 2021-11-09 DIAGNOSIS — R079 Chest pain, unspecified: Secondary | ICD-10-CM | POA: Diagnosis not present

## 2021-11-09 DIAGNOSIS — B961 Klebsiella pneumoniae [K. pneumoniae] as the cause of diseases classified elsewhere: Secondary | ICD-10-CM | POA: Diagnosis present

## 2021-11-09 DIAGNOSIS — C7951 Secondary malignant neoplasm of bone: Secondary | ICD-10-CM

## 2021-11-09 DIAGNOSIS — Z923 Personal history of irradiation: Secondary | ICD-10-CM | POA: Diagnosis not present

## 2021-11-09 DIAGNOSIS — N3001 Acute cystitis with hematuria: Secondary | ICD-10-CM | POA: Diagnosis not present

## 2021-11-09 DIAGNOSIS — E785 Hyperlipidemia, unspecified: Secondary | ICD-10-CM | POA: Diagnosis present

## 2021-11-09 DIAGNOSIS — K529 Noninfective gastroenteritis and colitis, unspecified: Secondary | ICD-10-CM | POA: Diagnosis not present

## 2021-11-09 DIAGNOSIS — T451X5A Adverse effect of antineoplastic and immunosuppressive drugs, initial encounter: Secondary | ICD-10-CM | POA: Diagnosis present

## 2021-11-09 DIAGNOSIS — D6481 Anemia due to antineoplastic chemotherapy: Secondary | ICD-10-CM | POA: Diagnosis present

## 2021-11-09 DIAGNOSIS — Z0389 Encounter for observation for other suspected diseases and conditions ruled out: Secondary | ICD-10-CM | POA: Diagnosis not present

## 2021-11-09 DIAGNOSIS — N281 Cyst of kidney, acquired: Secondary | ICD-10-CM | POA: Diagnosis not present

## 2021-11-09 LAB — GLUCOSE, CAPILLARY: Glucose-Capillary: 109 mg/dL — ABNORMAL HIGH (ref 70–99)

## 2021-11-09 MED ORDER — FLUDEOXYGLUCOSE F - 18 (FDG) INJECTION
7.3100 | Freq: Once | INTRAVENOUS | Status: AC
  Administered 2021-11-09: 7.31 via INTRAVENOUS

## 2021-11-12 ENCOUNTER — Telehealth: Payer: Self-pay

## 2021-11-12 ENCOUNTER — Emergency Department (HOSPITAL_COMMUNITY): Payer: Medicare PPO

## 2021-11-12 ENCOUNTER — Inpatient Hospital Stay (HOSPITAL_COMMUNITY): Payer: Medicare PPO

## 2021-11-12 ENCOUNTER — Other Ambulatory Visit: Payer: Self-pay

## 2021-11-12 ENCOUNTER — Encounter (HOSPITAL_COMMUNITY): Payer: Self-pay | Admitting: Emergency Medicine

## 2021-11-12 ENCOUNTER — Inpatient Hospital Stay (HOSPITAL_COMMUNITY)
Admission: EM | Admit: 2021-11-12 | Discharge: 2021-11-23 | DRG: 391 | Disposition: A | Discharge status: Home / Self Care | Payer: Medicare PPO | Attending: Internal Medicine | Admitting: Internal Medicine

## 2021-11-12 DIAGNOSIS — F17210 Nicotine dependence, cigarettes, uncomplicated: Secondary | ICD-10-CM | POA: Diagnosis present

## 2021-11-12 DIAGNOSIS — Z79899 Other long term (current) drug therapy: Secondary | ICD-10-CM

## 2021-11-12 DIAGNOSIS — E871 Hypo-osmolality and hyponatremia: Secondary | ICD-10-CM | POA: Diagnosis present

## 2021-11-12 DIAGNOSIS — E7849 Other hyperlipidemia: Secondary | ICD-10-CM | POA: Diagnosis not present

## 2021-11-12 DIAGNOSIS — N3001 Acute cystitis with hematuria: Secondary | ICD-10-CM | POA: Diagnosis not present

## 2021-11-12 DIAGNOSIS — K219 Gastro-esophageal reflux disease without esophagitis: Secondary | ICD-10-CM | POA: Diagnosis present

## 2021-11-12 DIAGNOSIS — D61818 Other pancytopenia: Secondary | ICD-10-CM | POA: Diagnosis present

## 2021-11-12 DIAGNOSIS — E440 Moderate protein-calorie malnutrition: Secondary | ICD-10-CM | POA: Diagnosis present

## 2021-11-12 DIAGNOSIS — B961 Klebsiella pneumoniae [K. pneumoniae] as the cause of diseases classified elsewhere: Secondary | ICD-10-CM | POA: Diagnosis present

## 2021-11-12 DIAGNOSIS — Z6825 Body mass index (BMI) 25.0-25.9, adult: Secondary | ICD-10-CM

## 2021-11-12 DIAGNOSIS — C7951 Secondary malignant neoplasm of bone: Secondary | ICD-10-CM | POA: Diagnosis present

## 2021-11-12 DIAGNOSIS — K529 Noninfective gastroenteritis and colitis, unspecified: Secondary | ICD-10-CM | POA: Diagnosis present

## 2021-11-12 DIAGNOSIS — C787 Secondary malignant neoplasm of liver and intrahepatic bile duct: Secondary | ICD-10-CM | POA: Diagnosis present

## 2021-11-12 DIAGNOSIS — Z923 Personal history of irradiation: Secondary | ICD-10-CM | POA: Diagnosis not present

## 2021-11-12 DIAGNOSIS — I10 Essential (primary) hypertension: Secondary | ICD-10-CM | POA: Diagnosis present

## 2021-11-12 DIAGNOSIS — E785 Hyperlipidemia, unspecified: Secondary | ICD-10-CM | POA: Diagnosis present

## 2021-11-12 DIAGNOSIS — C679 Malignant neoplasm of bladder, unspecified: Secondary | ICD-10-CM | POA: Diagnosis present

## 2021-11-12 DIAGNOSIS — T451X5A Adverse effect of antineoplastic and immunosuppressive drugs, initial encounter: Secondary | ICD-10-CM | POA: Diagnosis present

## 2021-11-12 DIAGNOSIS — D649 Anemia, unspecified: Secondary | ICD-10-CM | POA: Diagnosis present

## 2021-11-12 DIAGNOSIS — C775 Secondary and unspecified malignant neoplasm of intrapelvic lymph nodes: Secondary | ICD-10-CM | POA: Diagnosis present

## 2021-11-12 DIAGNOSIS — E876 Hypokalemia: Secondary | ICD-10-CM | POA: Diagnosis present

## 2021-11-12 DIAGNOSIS — J9601 Acute respiratory failure with hypoxia: Secondary | ICD-10-CM | POA: Diagnosis present

## 2021-11-12 DIAGNOSIS — D6481 Anemia due to antineoplastic chemotherapy: Secondary | ICD-10-CM | POA: Diagnosis present

## 2021-11-12 DIAGNOSIS — N390 Urinary tract infection, site not specified: Secondary | ICD-10-CM | POA: Diagnosis present

## 2021-11-12 DIAGNOSIS — D509 Iron deficiency anemia, unspecified: Secondary | ICD-10-CM | POA: Diagnosis present

## 2021-11-12 DIAGNOSIS — R1032 Left lower quadrant pain: Secondary | ICD-10-CM | POA: Diagnosis present

## 2021-11-12 LAB — URINALYSIS, ROUTINE W REFLEX MICROSCOPIC
Bilirubin Urine: NEGATIVE
Glucose, UA: NEGATIVE mg/dL
Hgb urine dipstick: NEGATIVE
Ketones, ur: NEGATIVE mg/dL
Nitrite: NEGATIVE
Protein, ur: 100 mg/dL — AB
Specific Gravity, Urine: 1.019 (ref 1.005–1.030)
WBC, UA: >50 WBC/hpf — ABNORMAL HIGH (ref 0–5)
pH: 5 (ref 5.0–8.0)

## 2021-11-12 LAB — HEPATIC FUNCTION PANEL
ALT: 24 U/L (ref 0–44)
AST: 22 U/L (ref 15–41)
Albumin: 2 g/dL — ABNORMAL LOW (ref 3.5–5.0)
Alkaline Phosphatase: 130 U/L — ABNORMAL HIGH (ref 38–126)
Bilirubin, Direct: 0.1 mg/dL (ref 0.0–0.2)
Indirect Bilirubin: 0.4 mg/dL (ref 0.3–0.9)
Total Bilirubin: 0.5 mg/dL (ref 0.3–1.2)
Total Protein: 5.3 g/dL — ABNORMAL LOW (ref 6.5–8.1)

## 2021-11-12 LAB — CBC WITH DIFFERENTIAL/PLATELET
Abs Immature Granulocytes: 0.5 10*3/uL — ABNORMAL HIGH (ref 0.00–0.07)
Basophils Absolute: 0.5 10*3/uL — ABNORMAL HIGH (ref 0.0–0.1)
Basophils Relative: 2 %
Eosinophils Absolute: 0.2 10*3/uL (ref 0.0–0.5)
Eosinophils Relative: 1 %
HCT: 23.2 % — ABNORMAL LOW (ref 36.0–46.0)
Hemoglobin: 7.8 g/dL — ABNORMAL LOW (ref 12.0–15.0)
Lymphocytes Relative: 14 %
Lymphs Abs: 3.4 10*3/uL (ref 0.7–4.0)
MCH: 30.7 pg (ref 26.0–34.0)
MCHC: 33.6 g/dL (ref 30.0–36.0)
MCV: 91.3 fL (ref 80.0–100.0)
Metamyelocytes Relative: 1 %
Monocytes Absolute: 2.2 10*3/uL — ABNORMAL HIGH (ref 0.1–1.0)
Monocytes Relative: 9 %
Myelocytes: 1 %
Neutro Abs: 18.1 10*3/uL — ABNORMAL HIGH (ref 1.7–7.7)
Neutrophils Relative %: 72 %
Platelets: 324 10*3/uL (ref 150–400)
RBC: 2.54 MIL/uL — ABNORMAL LOW (ref 3.87–5.11)
RDW: 16.2 % — ABNORMAL HIGH (ref 11.5–15.5)
WBC: 24.6 10*3/uL — ABNORMAL HIGH (ref 4.0–10.5)
nRBC: 0.4 % — ABNORMAL HIGH (ref 0.0–0.2)

## 2021-11-12 LAB — CK: Total CK: 39 U/L (ref 38–234)

## 2021-11-12 LAB — COMPREHENSIVE METABOLIC PANEL
ALT: 27 U/L (ref 0–44)
AST: 26 U/L (ref 15–41)
Albumin: 2.2 g/dL — ABNORMAL LOW (ref 3.5–5.0)
Alkaline Phosphatase: 142 U/L — ABNORMAL HIGH (ref 38–126)
Anion gap: 12 (ref 5–15)
BUN: 21 mg/dL (ref 8–23)
CO2: 25 mmol/L (ref 22–32)
Calcium: 8 mg/dL — ABNORMAL LOW (ref 8.9–10.3)
Chloride: 96 mmol/L — ABNORMAL LOW (ref 98–111)
Creatinine, Ser: 0.98 mg/dL (ref 0.44–1.00)
GFR, Estimated: >60 mL/min (ref >60)
Glucose, Bld: 106 mg/dL — ABNORMAL HIGH (ref 70–99)
Potassium: 3 mmol/L — ABNORMAL LOW (ref 3.5–5.1)
Sodium: 133 mmol/L — ABNORMAL LOW (ref 135–145)
Total Bilirubin: 0.4 mg/dL (ref 0.3–1.2)
Total Protein: 5.7 g/dL — ABNORMAL LOW (ref 6.5–8.1)

## 2021-11-12 LAB — RETICULOCYTES
Immature Retic Fract: 36.8 % — ABNORMAL HIGH (ref 2.3–15.9)
RBC.: 2.4 MIL/uL — ABNORMAL LOW (ref 3.87–5.11)
Retic Count, Absolute: 53.5 10*3/uL (ref 19.0–186.0)
Retic Ct Pct: 2.2 % (ref 0.4–3.1)

## 2021-11-12 LAB — PREPARE RBC (CROSSMATCH)

## 2021-11-12 LAB — MAGNESIUM: Magnesium: 1.2 mg/dL — ABNORMAL LOW (ref 1.7–2.4)

## 2021-11-12 LAB — TSH: TSH: 2.038 u[IU]/mL (ref 0.350–4.500)

## 2021-11-12 LAB — LACTIC ACID, PLASMA: Lactic Acid, Venous: 1.5 mmol/L (ref 0.5–1.9)

## 2021-11-12 LAB — PHOSPHORUS: Phosphorus: 3.4 mg/dL (ref 2.5–4.6)

## 2021-11-12 LAB — LIPASE, BLOOD: Lipase: 21 U/L (ref 11–51)

## 2021-11-12 LAB — FOLATE: Folate: 14.5 ng/mL (ref >5.9)

## 2021-11-12 MED ORDER — POTASSIUM CHLORIDE 10 MEQ/100ML IV SOLN
10.0000 meq | INTRAVENOUS | Status: AC
  Administered 2021-11-12 – 2021-11-13 (×4): 10 meq via INTRAVENOUS
  Filled 2021-11-12 (×4): qty 100

## 2021-11-12 MED ORDER — SODIUM CHLORIDE 0.9% IV SOLUTION
Freq: Once | INTRAVENOUS | Status: DC
Start: 2021-11-12 — End: 2021-11-23

## 2021-11-12 MED ORDER — PIPERACILLIN-TAZOBACTAM 3.375 G IVPB 30 MIN
3.3750 g | Freq: Once | INTRAVENOUS | Status: AC
  Administered 2021-11-12: 3.375 g via INTRAVENOUS
  Filled 2021-11-12: qty 50

## 2021-11-12 MED ORDER — PIPERACILLIN-TAZOBACTAM 3.375 G IVPB
3.3750 g | Freq: Three times a day (TID) | INTRAVENOUS | Status: DC
  Administered 2021-11-13 – 2021-11-17 (×13): 3.375 g via INTRAVENOUS
  Filled 2021-11-12 (×15): qty 50

## 2021-11-12 MED ORDER — MAGNESIUM SULFATE 2 GM/50ML IV SOLN
2.0000 g | Freq: Once | INTRAVENOUS | Status: AC
  Administered 2021-11-12: 2 g via INTRAVENOUS
  Filled 2021-11-12: qty 50

## 2021-11-12 MED ORDER — LACTATED RINGERS IV SOLN
INTRAVENOUS | Status: DC
Start: 2021-11-12 — End: 2021-11-21

## 2021-11-12 MED ORDER — DIPHENHYDRAMINE HCL 25 MG PO CAPS: 25.0000 mg | ORAL_CAPSULE | Freq: Once | ORAL | Status: DC

## 2021-11-12 MED ORDER — IOHEXOL 300 MG/ML  SOLN
100.0000 mL | Freq: Once | INTRAMUSCULAR | Status: AC | PRN
  Administered 2021-11-12: 100 mL via INTRAVENOUS

## 2021-11-12 MED ORDER — ACETAMINOPHEN 325 MG PO TABS: 650.0000 mg | ORAL_TABLET | Freq: Once | ORAL | Status: DC

## 2021-11-12 NOTE — ED Notes (Addendum)
Patient declined 2nd set of blood culture ?

## 2021-11-12 NOTE — Telephone Encounter (Signed)
Per Dr Marin Olp: Pt had diverticulitis show in her scan. Please check on pt and see how she is doing. She may need to be admitted for IV abx.  ?Spoke with pt who states she has had pain in her intestines on and off today. The pain gets so bad that her whole body shakes. Advised pt she needs to go to [facility] ER for tx and pt agreed to do so. Dr Marin Olp to speak with ER doctor about admitting pt this afternoon. ?

## 2021-11-12 NOTE — Assessment & Plan Note (Signed)
Many bacteria in the urine ?Zosyn should cover ?

## 2021-11-12 NOTE — ED Provider Notes (Signed)
?[facility] DEPT ?Provider Note ? ? ?CSN: [id number] ?Arrival date & time: 11/12/21  1743 ? ?  ? ?History ? ?Chief Complaint  ?Patient presents with  ? Abdominal Pain  ? ? ?Cheryl Singh is a 71 y.o. female. ? ?71 year old female presents with left lower quadrant abdominal pain times several weeks.  Patient currently being treated for bladder cancer by Dr. Marin Olp.  Had a PET scan done today which shows some inflammatory changes consistent with colitis and diverticulitis in her left lower quadrant.  Patient states has had no fevers.  Had no emesis.  Pain described as a sharp and worse with movement.  No prior history of diverticulitis.  I spoke with Dr. Marin Olp myself concerning her current symptoms and findings ? ? ?  ? ?Home Medications ?Prior to Admission medications   ?Medication Sig Start Date End Date Taking? Authorizing Provider  ?amLODipine (NORVASC) 5 MG tablet Take by mouth. ?Patient not taking: Reported on 11/01/2021 10/09/21   [provider]  ?atorvastatin (LIPITOR) 10 MG tablet Take 10 mg by mouth every evening.    [provider]  ?celecoxib (CELEBREX) 200 MG capsule Take 1 capsule (200 mg total) by mouth 2 (two) times daily. 09/04/21   Volanda Napoleon, MD  ?Cholecalciferol (VITAMIN D3) 50 MCG (2000 UT) capsule Take 2,000 Units by mouth daily. ?Patient not taking: Reported on 11/01/2021    [provider]  ?dexamethasone (DECADRON) 4 MG tablet Take 2 tablets (8 mg total) by mouth daily. Take daily x 3 days starting the day after cisplatin chemotherapy. Take with food. ?Patient not taking: Reported on 11/01/2021 08/22/21   Volanda Napoleon, MD  ?diltiazem (CARDIZEM) 30 MG tablet Take 2 tablets (60 mg total) by mouth 3 (three) times daily. 10/31/21   Volanda Napoleon, MD  ?famotidine (PEPCID) 40 MG tablet Take 1 tablet (40 mg total) by mouth 2 (two) times daily. 09/04/21   Volanda Napoleon, MD  ?fentaNYL (DURAGESIC) 25 MCG/HR Place 1 patch onto the skin  every 3 (three) days. 11/08/21   Volanda Napoleon, MD  ?FOLIC ACID PO Take 1 tablet by mouth daily. ?Patient not taking: Reported on 11/01/2021    [provider]  ?gabapentin (NEURONTIN) 300 MG capsule Take 1 capsule (300 mg total) by mouth 3 (three) times daily. ?Patient not taking: Reported on 11/01/2021 10/19/21   Volanda Napoleon, MD  ?lidocaine-prilocaine (EMLA) cream Apply to affected area once 08/22/21   Volanda Napoleon, MD  ?losartan (COZAAR) 100 MG tablet Take 100 mg by mouth daily at 6 (six) AM. ?Patient not taking: Reported on 11/01/2021    [provider]  ?Multiple Vitamins-Minerals (MULTIVITAMIN WITH MINERALS) tablet Take 1 tablet by mouth daily.    [provider]  ?Omega-3 Fatty Acids (FISH OIL) 1000 MG CAPS Take 1,000 mg by mouth daily. ?Patient not taking: Reported on 11/01/2021    [provider]  ?ondansetron (ZOFRAN) 8 MG tablet Take 1 tablet (8 mg total) by mouth 2 (two) times daily as needed. Start on the third day after cisplatin chemotherapy. 08/22/21   Volanda Napoleon, MD  ?oxyCODONE-acetaminophen (PERCOCET/ROXICET) 5-325 MG tablet Take 1-2 tablets by mouth every 4 (four) hours as needed for severe pain. 10/26/21   Volanda Napoleon, MD  ?polyethylene glycol (MIRALAX / GLYCOLAX) 17 g packet Take 17 g by mouth daily as needed for mild constipation.    [provider]  ?potassium chloride SA (KLOR-CON M) 20 MEQ tablet  Take 1 tablet (20 mEq total) by mouth daily. 10/30/21   Volanda Napoleon, MD  ?prochlorperazine (COMPAZINE) 10 MG tablet Take 1 tablet (10 mg total) by mouth every 6 (six) hours as needed (Nausea or vomiting). ?Patient not taking: Reported on 10/23/2021 08/22/21   Volanda Napoleon, MD  ?Simethicone (GAS RELIEF) 250 MG CAPS Take 1 capsule by mouth.    [provider]  ?   ? ?Allergies    ?Patient has no known allergies.   ? ?Review of Systems   ?Review of Systems  ?All other systems reviewed and are negative. ? ?Physical Exam ?Updated Vital  Signs ?BP 121/77 (BP Location: Left Arm)   Pulse 98   Temp 97.7 ?F (36.5 ?C) (Oral)   Resp 18   SpO2 100%  ?Physical Exam ?Vitals and nursing note reviewed.  ?Constitutional:   ?   General: She is not in acute distress. ?   Appearance: Normal appearance. She is well-developed. She is not toxic-appearing.  ?HENT:  ?   Head: Normocephalic and atraumatic.  ?Eyes:  ?   General: Lids are normal.  ?   Conjunctiva/sclera: Conjunctivae normal.  ?   Pupils: Pupils are equal, round, and reactive to light.  ?Neck:  ?   Thyroid: No thyroid mass.  ?   Trachea: No tracheal deviation.  ?Cardiovascular:  ?   Rate and Rhythm: Normal rate and regular rhythm.  ?   Heart sounds: Normal heart sounds. No murmur heard. ?  No gallop.  ?Pulmonary:  ?   Effort: Pulmonary effort is normal. No respiratory distress.  ?   Breath sounds: Normal breath sounds. No stridor. No decreased breath sounds, wheezing, rhonchi or rales.  ?Abdominal:  ?   General: There is no distension.  ?   Palpations: Abdomen is soft.  ?   Tenderness: There is abdominal tenderness in the left lower quadrant. There is guarding. There is no rebound.  ?Musculoskeletal:     ?   General: No tenderness. Normal range of motion.  ?   Cervical back: Normal range of motion and neck supple.  ?Skin: ?   General: Skin is warm and dry.  ?   Findings: No abrasion or rash.  ?Neurological:  ?   Mental Status: She is alert and oriented to person, place, and time. Mental status is at baseline.  ?   GCS: GCS eye subscore is 4. GCS verbal subscore is 5. GCS motor subscore is 6.  ?   Cranial Nerves: No cranial nerve deficit.  ?   Sensory: No sensory deficit.  ?   Motor: Motor function is intact.  ?Psychiatric:     ?   Attention and Perception: Attention normal.     ?   Speech: Speech normal.     ?   Behavior: Behavior normal.  ? ? ?ED Results / Procedures / Treatments   ?Labs ?(all labs ordered are listed, but only abnormal results are displayed) ?Labs Reviewed  ?CBC WITH  DIFFERENTIAL/PLATELET - Abnormal; Notable for the following components:  ?    Result Value  ? WBC 24.6 (*)   ? RBC 2.54 (*)   ? Hemoglobin 7.8 (*)   ? HCT 23.2 (*)   ? RDW 16.2 (*)   ? nRBC 0.4 (*)   ? Neutro Abs 18.1 (*)   ? Monocytes Absolute 2.2 (*)   ? Basophils Absolute 0.5 (*)   ? Abs Immature Granulocytes 0.50 (*)   ? All other components within  normal limits  ?COMPREHENSIVE METABOLIC PANEL - Abnormal; Notable for the following components:  ? Sodium 133 (*)   ? Potassium 3.0 (*)   ? Chloride 96 (*)   ? Glucose, Bld 106 (*)   ? Calcium 8.0 (*)   ? Total Protein 5.7 (*)   ? Albumin 2.2 (*)   ? Alkaline Phosphatase 142 (*)   ? All other components within normal limits  ?URINALYSIS, ROUTINE W REFLEX MICROSCOPIC - Abnormal; Notable for the following components:  ? Color, Urine AMBER (*)   ? APPearance CLOUDY (*)   ? Protein, ur 100 (*)   ? Leukocytes,Ua LARGE (*)   ? WBC, UA >50 (*)   ? Bacteria, UA MANY (*)   ? Non Squamous Epithelial 0-5 (*)   ? All other components within normal limits  ?CULTURE, BLOOD (ROUTINE X 2)  ?CULTURE, BLOOD (ROUTINE X 2)  ?LIPASE, BLOOD  ? ? ?EKG ?None ? ?Radiology ?No results found. ? ?Procedures ?Procedures  ? ? ?Medications Ordered in ED ?Medications  ?lactated ringers infusion (has no administration in time range)  ? ? ?ED Course/ Medical Decision Making/ A&P ?  ?                        ?Medical Decision Making ?Amount and/or Complexity of Data Reviewed ?Labs: ordered. ?Radiology: ordered. ? ?Risk ?Prescription drug management. ? ? ?Patient presented with several weeks of abdominal discomfort.  Seen by her doctor today and had a PET scan which showed findings concerning for colitis as well as diverticulitis.  I subsequently ordered an abdominal CT was per my review and interpretation shows colitis but no abscess or perforation.  She has significant leukocytosis of 25,000.  Patient started on IV antibiotics at this time.  Patient is on any therapy at this time and will require  admission as she is at high risk for becoming septic.  Discussed with hospitalist who will admit the patient with ? ? ? ? ? ? ? ?Final Clinical Impression(s) / ED Diagnoses ?Final diagnoses:  ?None  ? ? ?Rx / DC Orders ?ED Disch

## 2021-11-12 NOTE — ED Triage Notes (Signed)
Pt reports being sent over by [facility] doctor due to diverticulitis. Pt has severe abd pain. Hx bladder ca.  ?

## 2021-11-12 NOTE — Assessment & Plan Note (Signed)
Continue ppi

## 2021-11-12 NOTE — Assessment & Plan Note (Signed)
Allow permissive HTN 

## 2021-11-12 NOTE — Assessment & Plan Note (Addendum)
States every time she gets admitted her oxyen gets down  ?She used to smoke ?Will obtain CXR ?Of note was found to be also transiently hypoxic during her last evaluations as well back then in March CT angio was done was negative for PE. ?Because of recurrent hypoxia and clear ?No evidence of emphysema on imaging. ?No chest pain patient is asymptomatic. ?Recently had contrasted CT scan would not be able to order CTA at this time if suspicion persists can get it done tomorrow given recurrent nature of hypoxia prior work-up negative for PE somewhat less likely ? ?

## 2021-11-12 NOTE — Assessment & Plan Note (Signed)
-  Continue Lipitor °

## 2021-11-12 NOTE — Assessment & Plan Note (Signed)
Will replace ?

## 2021-11-12 NOTE — Subjective & Objective (Signed)
Getting chemo for baldder cancer ? She had PET scan ?Showed bone meds ?But also colitis ?She was sent to ER ?No diarrhea no fever she had slight abdominal pain  ?Not on steroids ?

## 2021-11-12 NOTE — Assessment & Plan Note (Addendum)
Obtain anemia panel, transfuse 1 unit ?

## 2021-11-12 NOTE — Assessment & Plan Note (Addendum)
Followed by Dr Marin Olp ?Sent him email ?

## 2021-11-12 NOTE — ED Provider Triage Note (Signed)
Emergency Medicine Provider Triage Evaluation Note ? ?Darlina Guys , a 71 y.o. female  was evaluated in triage.  Pt complains of left lower abdominal pain x several months, hx diverticulitis, sent by Dr. Marin Olp for IV antibiotics, not currently on oral chemo. Decreased stool output, no blood in stools. Some vomiting, ?fever (had chills last week).  ?Due for chemo tomorrow, last chemo unknown, hx bladder cancer.  ? ?Review of Systems  ?Positive: Abdominal pain ?Negative: Blood in stools ? ?Physical Exam  ?BP 121/77 (BP Location: Left Arm)   Pulse 98   Temp 97.7 ?F (36.5 ?C) (Oral)   Resp 18   SpO2 100%  ?Gen:   Awake, no distress   ?Resp:  Normal effort  ?MSK:   Moves extremities without difficulty  ?Other:   ? ?Medical Decision Making  ?Medically screening exam initiated at 6:17 PM.  Appropriate orders placed.  SHRUTHI NORTHRUP was informed that the remainder of the evaluation will be completed by another provider, this initial triage assessment does not replace that evaluation, and the importance of remaining in the ED until their evaluation is complete. ? ? ?  ?Tacy Learn, PA-C ?11/12/21 1817 ? ?

## 2021-11-12 NOTE — Assessment & Plan Note (Addendum)
Treat with zosyn  ?Bowel rest ?Pain control  ?Stool studies ?If dairrhea ?

## 2021-11-12 NOTE — ED Notes (Signed)
Patient up to the bathroom with assistance ?

## 2021-11-12 NOTE — Assessment & Plan Note (Signed)
Will replace check mag ?

## 2021-11-12 NOTE — H&P (Addendum)
? ? ?Cheryl Singh DUK:[id number] DOB: 1950-08-19 DOA: 11/12/2021 ? ? ?  ?PCP: Glenis Smoker, MD   ?Outpatient Specialists:   ?  ? Oncology  Dr.Ennever ?  ? ?Patient arrived to ER on 11/12/21 at 1743 ?Referred by Attending Lacretia Leigh, MD ? ? ?Patient coming from:   ? home Lives with caretaker ?  ? ?Chief Complaint:   ?Chief Complaint  ?Patient presents with  ? Abdominal Pain  ? ? ?HPI: ?Cheryl Singh is a 71 y.o. female with medical history significant of bladder cancer, HLD, HTN  ?  ? ?Presented with  abnormal imaging ?Getting chemo for baldder cancer ? She had PET scan ?Showed bone meds ?But also colitis ?She was sent to ER ?No diarrhea no fever she had slight abdominal pain  ?Not on steroids ?She deos not smoke no EtOH ? Says if she is prescribed steroids no aware ?She had been having severe pain in left lower abd for few week ?Actually better now ?But decadron is on her list and she admits to taking it ?Deneis any bacl stool no blood in stools ? ?Regarding pertinent Chronic problems:   ? ? Hyperlipidemia -  on statins lipitor ?Lipid Panel  ?No results found for: CHOL, TRIG, HDL, CHOLHDL, VLDL, LDLCALC, LDLDIRECT, LABVLDL ? ? HTN on   dilatiazem,  has been off losartan ?    ?   ? Chronic anemia - baseline hg Hemoglobin & Hematocrit  ?Recent Labs  ?  10/23/21 ?0824 10/30/21 ?1109 11/12/21 ?1818  ?HGB 7.2* 11.5* 7.8*  ? ?   ? ?While in ER: ?  ?CT confirmed diverticulitis started on Zosyn ? WBC up to 25 ? ?Ordered ?  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 No ?acute fractures are seen. Some bladder wall thickening is noted ?particularly on the left consistent with the given clinical history ?of bladder carcinoma. ?  ?Diffuse thickening in the sigmoid extending towards the rectum ?consistent with acute colitis stable from the recent CT. No ?perforation or abscess is identified. ?   ? ?Following Medications were ordered in ER: ?Medications  ?lactated ringers  infusion ( Intravenous New Bag/Given 11/12/21 2044)  ?piperacillin-tazobactam (ZOSYN) IVPB 3.375 g (0 g Intravenous Stopped 11/12/21 2044)  ?iohexol (OMNIPAQUE) 300 MG/ML solution 100 mL (100 mLs Intravenous Contrast Given 11/12/21 1945)  ?   ?  ?ED Triage Vitals  ?Enc Vitals Group  ?   BP 11/12/21 1751 121/77  ?   Pulse Rate 11/12/21 1751 98  ?   Resp 11/12/21 1751 18  ?   Temp 11/12/21 1751 97.7 ?F (36.5 ?C)  ?   Temp Source 11/12/21 1751 Oral  ?   SpO2 11/12/21 1751 100 %  ?   Weight --   ?   Height --   ?   Head Circumference --   ?   Peak Flow --   ?   Pain Score 11/12/21 1807 8  ?   Pain Loc --   ?   Pain Edu? --   ?   Excl. in [place]? --   ?BJSE(83)@    ? _________________________________________ ?Significant initial  Findings: ?Abnormal Labs Reviewed  ?CBC WITH DIFFERENTIAL/PLATELET - Abnormal; Notable for the following components:  ?    Result Value  ? WBC 24.6 (*)   ? RBC 2.54 (*)   ? Hemoglobin 7.8 (*)   ? HCT 23.2 (*)   ? RDW 16.2 (*)   ? nRBC 0.4 (*)   ?  Neutro Abs 18.1 (*)   ? Monocytes Absolute 2.2 (*)   ? Basophils Absolute 0.5 (*)   ? Abs Immature Granulocytes 0.50 (*)   ? All other components within normal limits  ?COMPREHENSIVE METABOLIC PANEL - Abnormal; Notable for the following components:  ? Sodium 133 (*)   ? Potassium 3.0 (*)   ? Chloride 96 (*)   ? Glucose, Bld 106 (*)   ? Calcium 8.0 (*)   ? Total Protein 5.7 (*)   ? Albumin 2.2 (*)   ? Alkaline Phosphatase 142 (*)   ? All other components within normal limits  ?URINALYSIS, ROUTINE W REFLEX MICROSCOPIC - Abnormal; Notable for the following components:  ? Color, Urine AMBER (*)   ? APPearance CLOUDY (*)   ? Protein, ur 100 (*)   ? Leukocytes,Ua LARGE (*)   ? WBC, UA >50 (*)   ? Bacteria, UA MANY (*)   ? Non Squamous Epithelial 0-5 (*)   ? All other components within normal limits  ? ?   ?ECG: Ordered ?   ?  ?WBC ? ?   ?Component Value Date/Time  ? WBC 24.6 (H) 11/12/2021 1818  ? LYMPHSABS 3.4 11/12/2021 1818  ? MONOABS 2.2 (H) 11/12/2021 1818  ?  EOSABS 0.2 11/12/2021 1818  ? BASOSABS 0.5 (H) 11/12/2021 1818  ? ?  ? ?Lactic Acid, Venous ?   ?Component Value Date/Time  ? LATICACIDVEN 1.5 11/12/2021 2236  ?  ? ? UA  evidence of UTI    ?  ?Urine analysis: ?   ?Component Value Date/Time  ? COLORURINE AMBER (A) 11/12/2021 1832  ? APPEARANCEUR CLOUDY (A) 11/12/2021 1832  ? LABSPEC 1.019 11/12/2021 1832  ? PHURINE 5.0 11/12/2021 1832  ? GLUCOSEU NEGATIVE 11/12/2021 1832  ? [place] NEGATIVE 11/12/2021 1832  ? [place] NEGATIVE 11/12/2021 1832  ? [place] NEGATIVE 11/12/2021 1832  ? PROTEINUR 100 (A) 11/12/2021 1832  ? NITRITE NEGATIVE 11/12/2021 1832  ? LEUKOCYTESUR LARGE (A) 11/12/2021 1832  ? ? ?Results for orders placed or performed during the hospital encounter of 09/14/21  ?Resp Panel by RT-PCR (Flu A&B, Covid) Nasopharyngeal Swab     Status: None  ? Collection Time: 09/15/21 12:11 AM  ? Specimen: Nasopharyngeal Swab; Nasopharyngeal(NP) swabs in vial transport medium  ?Result Value Ref Range Status  ? SARS Coronavirus 2 by RT PCR NEGATIVE NEGATIVE Final  ?      ? Influenza A by PCR NEGATIVE NEGATIVE Final  ? Influenza B by PCR NEGATIVE NEGATIVE Final  ?      ?Urine Culture     Status: Abnormal  ? Collection Time: 09/15/21  8:59 AM  ? Specimen: Urine, Clean Catch  ?Result Value Ref Range Status  ? Specimen Description   Final  ?  URINE, CLEAN CATCH ?Performed at [facility], [place] [address], [place], [place] [address] ?  ? Special Requests   Final  ?  NONE ?Performed at [facility], [place] [address] [place], [place] [address] ?  ? Culture MULTIPLE SPECIES PRESENT, SUGGEST RECOLLECTION (A)  Final  ? Report Status 09/16/2021 FINAL  Final  ?Culture, blood (routine x 2)     Status: None  ? Collection Time: 09/15/21  9:46 AM  ? Specimen: BLOOD  ?Result Value Ref Range Status  ? Specimen Description   Final  ?  BLOOD LEFT ANTECUBITAL ?Performed at [facility], [place] [address] [place], [place] [address] ?  ?  Special Requests  Final  ?  BOTTLES DRAWN AEROBIC ONLY Blood Culture results may not be optimal due to an inadequate volume of blood received in culture bottles ?Performed at [facility], [place] [address] [place], [place] [address] ?  ? Culture   Final  ?  NO GROWTH 5 DAYS ?Performed at [facility], [place] [address], [place], [place] [address] ?  ? Report Status 09/20/2021 FINAL  Final  ?Culture, blood (routine x 2)     Status: None  ? Collection Time: 09/15/21  9:46 AM  ? Specimen: BLOOD  ?Result Value Ref Range Status  ? Specimen Description   Final  ?  BLOOD BLOOD LEFT HAND ?Performed at [facility], [place] [address], [place], [place] [address] ?  ? Special Requests   Final  ?  BOTTLES DRAWN AEROBIC ONLY Blood Culture adequate volume ?Performed at [facility], [place] [address], [place], [place] [address] ?  ? Culture   Final  ?  NO GROWTH 5 DAYS ?Performed at [facility], [place] [address] [place], [place] [address] ?  ? Report Status 09/20/2021 FINAL  Final  ?Urine Culture     Status: Abnormal  ? Collection Time: 09/16/21  5:30 PM  ? Specimen: Urine, Clean Catch  ?Result Value Ref Range Status  ? Specimen Description   Final  ?  URINE, CLEAN CATCH ?Performed at [facility], [place] [address], [place], [place] [address] ?  ? Special Requests   Final  ?  NONE ?Performed at [facility], [place] [address], [place], [place] [address] ?  ? Culture MULTIPLE SPECIES PRESENT, SUGGEST RECOLLECTION (A)  Final  ? Report Status 09/18/2021 FINAL  Final  ?Urine Culture     Status: Abnormal  ? Collection Time: 09/18/21  5:14 PM  ? Specimen: Urine, Catheterized  ?Result Value Ref Range Status  ? Specimen Description   Final  ?  URINE, CATHETERIZED ?Performed at [facility], [place] [address] [place], [place] [address] ?  ? Special Requests   Final  ?  NONE ?Performed at [facility], [place]  [address] [place], [place] [address] ?  ? Culture MULTIPLE SPECIES PRESENT, SUGGEST RECOLLECTION (A)  Final  ? Report Status 09/20/2021 FINAL  Final  ? ? ? ?_______________________________________________ ?

## 2021-11-12 NOTE — Progress Notes (Signed)
Pharmacy Antibiotic Note ? ?Cheryl Singh is a 71 y.o. female admitted on 11/12/2021 with hx of bladder cancer, HLD, HTN.  Presented with abdominal pain. Pharmacy has been consulted for zosyn dosing. ? ?Plan: ?Zosyn 3.375g IV Q8H infused over 4hrs. ?Pharmacy will sign off and follow peripherally ? ?  ? ?Temp (24hrs), Avg:97.7 ?F (36.5 ?C), Min:97.7 ?F (36.5 ?C), Max:97.7 ?F (36.5 ?C) ? ?Recent Labs  ?Lab 11/12/21 ?1818 11/12/21 ?2236  ?WBC 24.6*  --   ?CREATININE 0.98  --   ?LATICACIDVEN  --  1.5  ?  ?Estimated Creatinine Clearance: 51.7 mL/min (by C-G formula based on SCr of 0.98 mg/dL).   ? ?No Known Allergies ? ? ?Thank you for allowing pharmacy to be a part of this patient?s care. ? ?Dolly Rias RPh ?11/12/2021, 11:53 PM ? ?

## 2021-11-13 ENCOUNTER — Inpatient Hospital Stay (HOSPITAL_COMMUNITY): Payer: Medicare PPO

## 2021-11-13 ENCOUNTER — Inpatient Hospital Stay: Payer: Medicare PPO

## 2021-11-13 ENCOUNTER — Encounter: Payer: Self-pay | Admitting: *Deleted

## 2021-11-13 ENCOUNTER — Inpatient Hospital Stay: Payer: Medicare PPO | Admitting: Hematology & Oncology

## 2021-11-13 DIAGNOSIS — K529 Noninfective gastroenteritis and colitis, unspecified: Secondary | ICD-10-CM | POA: Diagnosis not present

## 2021-11-13 LAB — COMPREHENSIVE METABOLIC PANEL
ALT: 22 U/L (ref 0–44)
AST: 18 U/L (ref 15–41)
Albumin: 2 g/dL — ABNORMAL LOW (ref 3.5–5.0)
Alkaline Phosphatase: 124 U/L (ref 38–126)
Anion gap: 9 (ref 5–15)
BUN: 19 mg/dL (ref 8–23)
CO2: 26 mmol/L (ref 22–32)
Calcium: 7.6 mg/dL — ABNORMAL LOW (ref 8.9–10.3)
Chloride: 95 mmol/L — ABNORMAL LOW (ref 98–111)
Creatinine, Ser: 0.93 mg/dL (ref 0.44–1.00)
GFR, Estimated: >60 mL/min (ref >60)
Glucose, Bld: 92 mg/dL (ref 70–99)
Potassium: 4 mmol/L (ref 3.5–5.1)
Sodium: 130 mmol/L — ABNORMAL LOW (ref 135–145)
Total Bilirubin: 0.6 mg/dL (ref 0.3–1.2)
Total Protein: 5.2 g/dL — ABNORMAL LOW (ref 6.5–8.1)

## 2021-11-13 LAB — CBC
HCT: 24.3 % — ABNORMAL LOW (ref 36.0–46.0)
Hemoglobin: 8.3 g/dL — ABNORMAL LOW (ref 12.0–15.0)
MCH: 30.9 pg (ref 26.0–34.0)
MCHC: 34.2 g/dL (ref 30.0–36.0)
MCV: 90.3 fL (ref 80.0–100.0)
Platelets: 242 10*3/uL (ref 150–400)
RBC: 2.69 MIL/uL — ABNORMAL LOW (ref 3.87–5.11)
RDW: 15.7 % — ABNORMAL HIGH (ref 11.5–15.5)
WBC: 22.4 10*3/uL — ABNORMAL HIGH (ref 4.0–10.5)
nRBC: 0.4 % — ABNORMAL HIGH (ref 0.0–0.2)

## 2021-11-13 LAB — IRON AND TIBC
Iron: 24 ug/dL — ABNORMAL LOW (ref 28–170)
Saturation Ratios: 25 % (ref 10.4–31.8)
TIBC: 95 ug/dL — ABNORMAL LOW (ref 250–450)
UIBC: 71 ug/dL

## 2021-11-13 LAB — PREALBUMIN: Prealbumin: <5 mg/dL — ABNORMAL LOW (ref 18–38)

## 2021-11-13 LAB — FERRITIN: Ferritin: 2428 ng/mL — ABNORMAL HIGH (ref 11–307)

## 2021-11-13 LAB — MAGNESIUM: Magnesium: 1.9 mg/dL (ref 1.7–2.4)

## 2021-11-13 LAB — VITAMIN B12: Vitamin B-12: 2084 pg/mL — ABNORMAL HIGH (ref 180–914)

## 2021-11-13 MED ORDER — BOOST / RESOURCE BREEZE PO LIQD CUSTOM
1.0000 | Freq: Three times a day (TID) | ORAL | Status: DC
  Administered 2021-11-13 – 2021-11-15 (×5): 1 via ORAL

## 2021-11-13 MED ORDER — ATORVASTATIN CALCIUM 10 MG PO TABS
10.0000 mg | ORAL_TABLET | Freq: Every evening | ORAL | Status: DC
  Administered 2021-11-13 – 2021-11-22 (×10): 10 mg via ORAL
  Filled 2021-11-13 (×10): qty 1

## 2021-11-13 MED ORDER — ACETAMINOPHEN 650 MG RE SUPP: 650.0000 mg | Freq: Four times a day (QID) | RECTAL | Status: DC | PRN

## 2021-11-13 MED ORDER — HYDROCODONE-ACETAMINOPHEN 5-325 MG PO TABS
1.0000 | ORAL_TABLET | ORAL | Status: DC | PRN
  Administered 2021-11-14 – 2021-11-16 (×7): 2 via ORAL
  Administered 2021-11-16: 1 via ORAL
  Administered 2021-11-17 – 2021-11-18 (×5): 2 via ORAL
  Administered 2021-11-18: 1 via ORAL
  Administered 2021-11-18 – 2021-11-22 (×17): 2 via ORAL
  Administered 2021-11-22: 1 via ORAL
  Administered 2021-11-22 – 2021-11-23 (×4): 2 via ORAL
  Filled 2021-11-13: qty 2
  Filled 2021-11-13 (×2): qty 1
  Filled 2021-11-13 (×26): qty 2
  Filled 2021-11-13: qty 1
  Filled 2021-11-13 (×8): qty 2

## 2021-11-13 MED ORDER — SODIUM CHLORIDE 0.9 % IV SOLN
75.0000 mL/h | INTRAVENOUS | Status: AC
  Administered 2021-11-13: 75 mL/h via INTRAVENOUS

## 2021-11-13 MED ORDER — ACETAMINOPHEN 325 MG PO TABS
650.0000 mg | ORAL_TABLET | Freq: Four times a day (QID) | ORAL | Status: DC | PRN
Start: 2021-11-13 — End: 2021-11-23

## 2021-11-13 MED ORDER — DILTIAZEM HCL 30 MG PO TABS
60.0000 mg | ORAL_TABLET | Freq: Three times a day (TID) | ORAL | Status: DC
  Administered 2021-11-13 – 2021-11-23 (×31): 60 mg via ORAL
  Filled 2021-11-13 (×31): qty 2

## 2021-11-13 MED ORDER — FAMOTIDINE 20 MG PO TABS
40.0000 mg | ORAL_TABLET | Freq: Two times a day (BID) | ORAL | Status: DC
  Administered 2021-11-13 – 2021-11-23 (×22): 40 mg via ORAL
  Filled 2021-11-13 (×22): qty 2

## 2021-11-13 NOTE — Consult Note (Addendum)
[facility]  [phone] [phone]   MEDICAL ONCOLOGY - CONSULTATION  Reason for Consultation: Metastatic bladder cancer, anemia  HPI: Ms. Cheryl Singh is a 71 year old female with a past medical history significant for metastatic bladder cancer, hyperlipidemia, hypertension.  She had a PET scan performed 11/09/2021 which showed acute colitis and marked diverticulitis of the sigmoid and upper rectum with intense surrounding stranding and increased metabolic activity, interval improvement with respect to the liver but still with residual metabolic activity, new increased metabolism in the left retroperitoneal lymph node, extensive bony metastases with mixed response to therapy.  The patient was contacted by telephone yesterday to see how she was feeling given the findings of colitis and diverticulitis on her PET scan.  She reported pain in her intestines on and off with pain so bad that it would make her whole body shake.  She was advised to go to the emergency department for evaluation and IV antibiotics.  A CT scan was performed yesterday which showed hepatic and skeletal metastatic disease as well as some bladder wall thickening consistent with her history of bladder cancer.  Additionally, the CT scan showed diffuse thickening in the sigmoid extending towards the rectum consistent with acute colitis, no perforation or abscess.  Her admitting lab work showed a WBC of 24.6, hemoglobin 7.4 (previously 11.5 on 10/30/2021).  Her ferritin was elevated at 2428, iron 24, TIBC 95, percent saturation 25%.  She received 1 unit of PRBCs with improvement of her hemoglobin up to 8.3 this morning.  She has been started on IV antibiotics  She has been receiving systemic chemotherapy for her metastatic bladder cancer.  She is receiving cisplatin and gemcitabine.  She is status post 3 cycles of her chemotherapy-cycle 3-day 1 was given on 10/23/2021 and cycle 3-day 8 was given on  10/30/2021.  The patient was seen in the emergency department today.  She reports some improvement in her abdominal pain.  She is not having any nausea or vomiting.  Reports that her stools are somewhat loose.  She has not noticed any hematochezia or melena.  She is not having any fevers or chills.  Denies chest pain, shortness of breath.  She notices discoloration of her skin on her abdomen.  Medical oncology was asked to see the patient make recommendations regarding her metastatic bladder cancer as well as her anemia.  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) 08/16/2021   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 (HCC) 08/16/2021   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 (HCC) 08/16/2021   Goals of care, counseling/discussion 08/16/2021   History of radiation therapy    lumbar spine, left ribs  09/18/2021-10/02/2021  Dr Antony Blackbird   Hypertension   :   Past Surgical History:  Procedure Laterality Date   HEART CHAMBER REVISION     IR IMAGING GUIDED PORT INSERTION  08/20/2021   TRANSURETHRAL RESECTION OF BLADDER TUMOR Bilateral 07/17/2021   Procedure: CYSTOSCOPY BILATERAL RETROGRADES TRANSURETHRAL RESECTION OF BLADDER TUMOR (TURBT);  Surgeon: Belva Agee, MD;  Location: [facility];  Service: Urology;  Laterality: Bilateral;  :   Current Facility-Administered Medications  Medication Dose Route Frequency Provider Last Rate Last Admin   0.9 %  sodium chloride infusion (Manually program via Guardrails IV Fluids)   Intravenous Once Doutova, Anastassia, MD       0.9 %  sodium chloride infusion  75 mL/hr Intravenous Continuous Therisa Doyne, MD 75 mL/hr  at 11/13/21 0307 75 mL/hr at 11/13/21 1191   acetaminophen (TYLENOL) tablet 650 mg  650 mg Oral Q6H PRN Therisa Doyne, MD       Or   acetaminophen (TYLENOL) suppository 650 mg  650 mg Rectal Q6H PRN Doutova, Anastassia, MD       atorvastatin (LIPITOR) tablet 10 mg  10  mg Oral QPM Doutova, Anastassia, MD       diltiazem (CARDIZEM) tablet 60 mg  60 mg Oral TID Therisa Doyne, MD       famotidine (PEPCID) tablet 40 mg  40 mg Oral BID Doutova, Anastassia, MD   40 mg at 11/13/21 0308   HYDROcodone-acetaminophen (NORCO/VICODIN) 5-325 MG per tablet 1-2 tablet  1-2 tablet Oral Q4H PRN Therisa Doyne, MD       lactated ringers infusion   Intravenous Continuous Therisa Doyne, MD   Stopped at 11/13/21 0301   piperacillin-tazobactam (ZOSYN) IVPB 3.375 g  3.375 g Intravenous Q8H Maurice March, [facility]   Stopped at 11/13/21 0707   Current Outpatient Medications  Medication Sig Dispense Refill   atorvastatin (LIPITOR) 10 MG tablet Take 10 mg by mouth every evening.     celecoxib (CELEBREX) 200 MG capsule Take 1 capsule (200 mg total) by mouth 2 (two) times daily. 60 capsule 4   dexamethasone (DECADRON) 4 MG tablet Take 2 tablets (8 mg total) by mouth daily. Take daily x 3 days starting the day after cisplatin chemotherapy. Take with food. 30 tablet 1   diltiazem (CARDIZEM) 30 MG tablet Take 2 tablets (60 mg total) by mouth 3 (three) times daily. 180 tablet 2   famotidine (PEPCID) 40 MG tablet Take 1 tablet (40 mg total) by mouth 2 (two) times daily. 60 tablet 4   Multiple Vitamins-Minerals (MULTIVITAMIN WITH MINERALS) tablet Take 1 tablet by mouth daily.     ondansetron (ZOFRAN) 8 MG tablet Take 1 tablet (8 mg total) by mouth 2 (two) times daily as needed. Start on the third day after cisplatin chemotherapy. (Patient taking differently: Take 8 mg by mouth 2 (two) times daily as needed for nausea. Start on the third day after cisplatin chemotherapy.) 30 tablet 1   oxyCODONE-acetaminophen (PERCOCET/ROXICET) 5-325 MG tablet Take 1-2 tablets by mouth every 4 (four) hours as needed for severe pain. 60 tablet 0   polyethylene glycol (MIRALAX / GLYCOLAX) 17 g packet Take 17 g by mouth daily as needed for mild constipation.     potassium chloride SA (KLOR-CON M) 20 MEQ  tablet Take 1 tablet (20 mEq total) by mouth daily. 30 tablet 1   prochlorperazine (COMPAZINE) 10 MG tablet Take 1 tablet (10 mg total) by mouth every 6 (six) hours as needed (Nausea or vomiting). 30 tablet 1   Simethicone (GAS RELIEF) 250 MG CAPS Take 1 capsule by mouth daily as needed (for flatulence).     amLODipine (NORVASC) 5 MG tablet Take by mouth. (Patient not taking: Reported on 11/01/2021)     fentaNYL (DURAGESIC) 25 MCG/HR Place 1 patch onto the skin every 3 (three) days. (Patient not taking: Reported on 11/12/2021) 10 patch 0   gabapentin (NEURONTIN) 300 MG capsule Take 1 capsule (300 mg total) by mouth 3 (three) times daily. (Patient not taking: Reported on 11/12/2021) 90 capsule 1   lidocaine-prilocaine (EMLA) cream Apply to affected area once (Patient not taking: Reported on 11/12/2021) 30 g 3   losartan (COZAAR) 100 MG tablet Take 100 mg by mouth daily at 6 (six) AM. (Patient not taking: Reported  on 11/01/2021)       No Known Allergies:  History reviewed. No pertinent family history.:   Social History   Socioeconomic History   Marital status: Single    Spouse name: Not on file   Number of children: Not on file   Years of education: Not on file   Highest education level: Not on file  Occupational History   Not on file  Tobacco Use   Smoking status: Every Day    Packs/day: 0.50    Years: 40.00    Pack years: 20.00    Types: Cigarettes   Smokeless tobacco: Never  Vaping Use   Vaping Use: Never used  Substance and Sexual Activity   Alcohol use: Yes    Comment: occ   Drug use: No   Sexual activity: Not on file  Other Topics Concern   Not on file  Social History Narrative   Not on file   Social Determinants of Health   Financial Resource Strain: Not on file  Food Insecurity: Not on file  Transportation Needs: Not on file  Physical Activity: Not on file  Stress: Not on file  Social Connections: Not on file  Intimate Partner Violence: Not on file  :  Review of  Systems: A comprehensive 14 point review of systems was negative except as noted in the HPI.  Exam: Patient Vitals for the past 24 hrs:  BP Temp Temp src Pulse Resp SpO2  11/13/21 0830 116/65 -- -- 93 (!) 22 100 %  11/13/21 0730 136/77 -- -- 99 (!) 22 99 %  11/13/21 0530 130/70 -- -- 86 20 98 %  11/13/21 0242 125/70 97.8 F (36.6 C) Oral 89 13 100 %  11/13/21 0241 125/70 97.8 F (36.6 C) Oral 89 13 --  11/13/21 0200 128/66 -- -- 88 16 100 %  11/13/21 0130 115/62 -- -- 86 20 100 %  11/13/21 0100 (!) 108/94 -- -- 90 (!) 33 98 %  11/13/21 0054 119/69 98 F (36.7 C) Oral 91 (!) 21 --  11/13/21 0039 129/69 98.2 F (36.8 C) Oral 92 18 --  11/13/21 0030 129/69 -- -- 94 20 100 %  11/12/21 2330 125/81 -- -- 91 14 100 %  11/12/21 2130 126/68 -- -- -- -- --  11/12/21 2101 (!) 106/95 -- -- 91 17 100 %  11/12/21 2040 101/72 -- -- -- -- --  11/12/21 2015 127/74 -- -- 89 17 100 %  11/12/21 2000 -- -- -- (!) 33 -- (!) 63 %  11/12/21 1930 123/82 -- -- -- -- --  11/12/21 1915 (!) 83/47 -- -- -- -- --  11/12/21 1900 (!) 152/99 -- -- 72 -- (!) 82 %  11/12/21 1845 (!) 148/123 -- -- (!) 44 -- 90 %  11/12/21 1830 127/66 -- -- -- -- --  11/12/21 1751 121/77 97.7 F (36.5 C) Oral 98 18 100 %    General: Awake and alert, no distress.   Eyes:  no scleral icterus.   ENT:  There were no oropharyngeal lesions.     Respiratory: lungs were clear bilaterally without wheezing or crackles.   Cardiovascular:  Regular rate and rhythm, S1/S2, without murmur, rub or gallop.  There was no pedal edema.   GI: Positive bowel sounds, soft, mild tenderness to light palpation.  Skin: Discoloration noted over her abdomen. Neuro exam was nonfocal. Patient was alert and oriented.    Lab Results  Component Value Date   WBC 22.4 (H) 11/13/2021  HGB 8.3 (L) 11/13/2021   HCT 24.3 (L) 11/13/2021   PLT 242 11/13/2021   GLUCOSE 92 11/13/2021   ALT 22 11/13/2021   AST 18 11/13/2021   NA 130 (L) 11/13/2021   K 4.0  11/13/2021   CL 95 (L) 11/13/2021   CREATININE 0.93 11/13/2021   BUN 19 11/13/2021   CO2 26 11/13/2021    DG Chest 2 View  Result Date: 11/13/2021 CLINICAL DATA:  Abdominal pain, hypoxia, history of bladder cancer EXAM: CHEST - 2 VIEW COMPARISON:  09/20/2021 FINDINGS: Lungs are essentially clear.  No pleural effusion or pneumothorax. The heart is normal in size.  Thoracic aortic atherosclerosis. Right chest power port terminates at the cavoatrial junction. Visualized osseous structures are within normal limits. IMPRESSION: Normal chest radiographs. Electronically Signed   By: Charline Bills M.D.   On: 11/13/2021 00:28   CT Abdomen Pelvis W Contrast  Result Date: 11/12/2021 CLINICAL DATA:  Acute abdominal pain on the left EXAM: CT ABDOMEN AND PELVIS WITH CONTRAST TECHNIQUE: Multidetector CT imaging of the abdomen and pelvis was performed using the standard protocol following bolus administration of intravenous contrast. RADIATION DOSE REDUCTION: This exam was performed according to the departmental dose-optimization program which includes automated exposure control, adjustment of the mA and/or kV according to patient size and/or use of iterative reconstruction technique. CONTRAST:  OMNIPAQUE IOHEXOL 300 MG/ML  SOLN COMPARISON:  PET-CT from 11/09/2021 FINDINGS: Lower chest: Lungs are well aerated bilaterally. No focal infiltrate or sizable effusion is seen. Hepatobiliary: Liver demonstrates hypodensity best seen on image number 14 of series 2 similar to that seen on recent PET-CT consistent with metastatic disease. Gallbladder is within normal limits. Pancreas: Unremarkable. No pancreatic ductal dilatation or surrounding inflammatory changes. Spleen: Normal in size without focal abnormality. Adrenals/Urinary Tract: Adrenal glands are unremarkable. Kidneys demonstrate a normal enhancement pattern bilaterally. No renal calculi or obstructive changes are seen. Subcentimeter cysts are noted on the  left. No follow-up is recommended. Normal excretion of contrast is noted on delayed images. The bladder is decompressed with diffuse wall thickening particularly on the left with areas of diffuse enhancement consistent with the known history of bladder carcinoma. Stomach/Bowel: Rectosigmoid demonstrates significant wall thickening similar to that seen on the recent PET-CT most consistent with acute colitis. Scattered diverticular changes noted. The more proximal colon is within normal limits. The appendix is unremarkable. Small bowel and stomach are within normal limits. Vascular/Lymphatic: Aortic atherosclerosis. No enlarged abdominal or pelvic lymph nodes. Reproductive: Uterus and bilateral adnexa are unremarkable. Other: No free fluid is noted. Musculoskeletal: Multiple sclerotic foci are identified throughout the pelvis similar to that seen on prior exam consistent with metastatic disease. Similar changes are noted in the lumbar spine and visualized ribcage. No compression deformity is noted. IMPRESSION: Changes consistent with hepatic and skeletal metastatic disease. No acute fractures are seen. Some bladder wall thickening is noted particularly on the left consistent with the given clinical history of bladder carcinoma. Diffuse thickening in the sigmoid extending towards the rectum consistent with acute colitis stable from the recent CT. No perforation or abscess is identified. Electronically Signed   By: Alcide Clever M.D.   On: 11/12/2021 20:19   NM PET Image Restag (PS) Skull Base To Thigh  Addendum Date: 11/12/2021   ADDENDUM REPORT: 11/12/2021 16:16 ADDENDUM: There is also increased metabolic activity in the spleen which is favored to represent systemic inflammatory response rather than tumor given absence of visible lesion and no change in splenic size. Suggest attention  on follow-up. This may be related to ongoing marked inflammation of the rectosigmoid colon. Electronically Signed   By: Donzetta Kohut M.D.   On: 11/12/2021 16:16   Result Date: 11/12/2021 CLINICAL DATA:  Subsequent treatment strategy for bladder cancer in a 71 year old female. EXAM: NUCLEAR MEDICINE PET SKULL BASE TO THIGH TECHNIQUE: 7.31 mCi F-18 FDG was injected intravenously. Full-ring PET imaging was performed from the skull base to thigh after the radiotracer. CT data was obtained and used for attenuation correction and anatomic localization. Fasting blood glucose: 109 mg/dl COMPARISON:  CT angiography of the chest and PET evaluation from March of 2023. FINDINGS: Mediastinal blood pool activity: SUV max 1.80 Liver activity: SUV max NA NECK: No discrete adenopathy in the neck with hypermetabolic features. Incidental CT findings: none CHEST: No adenopathy in the chest or suspicious pulmonary nodules. Incidental CT findings: RIGHT-sided Port-A-Cath terminates at the upper portion of the RIGHT atrium at the caval to atrial junction. Calcified coronary artery disease. Aberrant RIGHT subclavian. Calcified atherosclerosis. Normal heart size without pericardial effusion. No adenopathy by size criteria in the chest. ABDOMEN/PELVIS: Large bladder mass which was present on previous imaging is no longer visible though the urinary bladder is collapsed. LEFT para-aortic lymph node (image 119/4) 6 mm as compared to 5 mm, not previously hypermetabolic now with a maximum SUV of 4.67. Signs of hepatic metastatic disease have nearly completely resolved, marked interval improvement. Area with greatest hypermetabolic activity with a maximum SUV of 5.68 previously as high as 14. Subtle low level activity only slightly greater than hepatic parenchyma may be indicative of residual uptake within lesions which are not well seen on CT, hepatic subsegment V/VIII 9 mm lesion previously as large as 21 mm. Spleen with greater FDG uptake than liver on today's exam, of uncertain significance. No visible lesion in the spleen in the spleen is stable in size. Incidental  CT findings: Diffuse sigmoid thickening extending into the upper rectum with pericolonic and perirectal stranding in the setting of diverticular disease with marked increased metabolic activity. Longer segment than expected for usual diverticulitis though still potentially related to diverticulitis. Marked increased metabolic activity associated with this finding maximum SUV 18.7. No acute findings relative liver, gallbladder, spleen, pancreas, adrenal glands, kidneys, stomach and small bowel. Pancolonic diverticulosis. Appendix not visible without secondary signs to suggest acute appendiceal process. Aortic atherosclerosis without aneurysm. No adenopathy by size criteria in the retroperitoneum. SKELETON: Diffuse skeletal metastases. Mixed response with respect to skeletal metastatic disease. CT appearance is largely similar though with increasing sclerosis of many areas. Metabolic activity is diminished in the lumbar spine and has developed in the interval at T12 for example. Maximum SUV at T12 of 5.97 also with activity at T9 the may be slightly increased. Diffuse marrow space activity in the bilateral proximal femora. Separate from areas of metastatic disease that were seen previously. Also with marrow space activity in the bilateral proximal humeri. Increased metabolic activity in the cervical spine in general, at multiple levels not present previously. Discrete metastatic focus above the RIGHT acetabulum and LEFT acetabulum in the LEFT iliac and RIGHT iliac bones show substantial improvement. Maximum SUV currently in this area of 3.81 as compared to 14.85. Increasing sclerosis of RIGHT femoral neck lesion in many of the other pelvic lesions seen on previous imaging. Incidental CT findings: None IMPRESSION: Signs of acute colitis and or marked diverticulitis of the sigmoid and upper rectum with intense surrounding stranding and increased metabolic activity. Correlation with patient symptoms and with  any risk  factors for infectious colitis such as recent antibiotic administration is suggested. Interval improvement with respect to disease in the liver still with residual metabolic activity at low levels in focal areas in the liver. No hypermetabolic lymph nodes in the chest but with new increased metabolism in the LEFT retroperitoneal lymph node. Extensive bony metastases with mixed response to therapy. There is diffuse increased metabolic activity within visualized marrow spaces, for example symmetric in the bilateral proximal femora without corresponding CT lesions, also seen in bilateral proximal humeri and in marrow spaces throughout the spine. Diffuse metastases are considered in addition to, if applicable marrow space stimulation. This more likely represents a mixed picture of marrow space stimulation of present and worsening of metastatic disease. In the absence of marrow space stimulation this represents substantial worsening of metastatic disease. Increasingly sclerotic appearance of many of the skeletal lesions may reflect some bony healing. Diminished activity most notably in the lumbar spine may relate to interval radiotherapy. These results were called by telephone at the time of interpretation on 11/12/2021 at 4:07 pm to provider [facility] , who verbally acknowledged these results. Electronically Signed: By: Donzetta Kohut M.D. On: 11/12/2021 16:07   DG Abd 2 Views  Result Date: 10/19/2021 CLINICAL DATA:  71 year old female with abdominal pain after radiation treatment. EXAM: ABDOMEN - 2 VIEW COMPARISON:  08/02/2021 FINDINGS: The bowel gas pattern is normal. There is no evidence of free air. No radio-opaque calculi or other significant radiographic abnormality is seen. Similar sigmoid curvature of the thoracolumbar spine. IMPRESSION: Nonobstructive bowel gas pattern.  No pneumoperitoneum. Electronically Signed   By: Marliss Coots M.D.   On: 10/19/2021 09:14     DG Chest 2 View  Result Date:  11/13/2021 CLINICAL DATA:  Abdominal pain, hypoxia, history of bladder cancer EXAM: CHEST - 2 VIEW COMPARISON:  09/20/2021 FINDINGS: Lungs are essentially clear.  No pleural effusion or pneumothorax. The heart is normal in size.  Thoracic aortic atherosclerosis. Right chest power port terminates at the cavoatrial junction. Visualized osseous structures are within normal limits. IMPRESSION: Normal chest radiographs. Electronically Signed   By: Charline Bills M.D.   On: 11/13/2021 00:28   CT Abdomen Pelvis W Contrast  Result Date: 11/12/2021 CLINICAL DATA:  Acute abdominal pain on the left EXAM: CT ABDOMEN AND PELVIS WITH CONTRAST TECHNIQUE: Multidetector CT imaging of the abdomen and pelvis was performed using the standard protocol following bolus administration of intravenous contrast. RADIATION DOSE REDUCTION: This exam was performed according to the departmental dose-optimization program which includes automated exposure control, adjustment of the mA and/or kV according to patient size and/or use of iterative reconstruction technique. CONTRAST:  OMNIPAQUE IOHEXOL 300 MG/ML  SOLN COMPARISON:  PET-CT from 11/09/2021 FINDINGS: Lower chest: Lungs are well aerated bilaterally. No focal infiltrate or sizable effusion is seen. Hepatobiliary: Liver demonstrates hypodensity best seen on image number 14 of series 2 similar to that seen on recent PET-CT consistent with metastatic disease. Gallbladder is within normal limits. Pancreas: Unremarkable. No pancreatic ductal dilatation or surrounding inflammatory changes. Spleen: Normal in size without focal abnormality. Adrenals/Urinary Tract: Adrenal glands are unremarkable. Kidneys demonstrate a normal enhancement pattern bilaterally. No renal calculi or obstructive changes are seen. Subcentimeter cysts are noted on the left. No follow-up is recommended. Normal excretion of contrast is noted on delayed images. The bladder is decompressed with diffuse wall  thickening particularly on the left with areas of diffuse enhancement consistent with the known history of bladder carcinoma. Stomach/Bowel: Rectosigmoid  demonstrates significant wall thickening similar to that seen on the recent PET-CT most consistent with acute colitis. Scattered diverticular changes noted. The more proximal colon is within normal limits. The appendix is unremarkable. Small bowel and stomach are within normal limits. Vascular/Lymphatic: Aortic atherosclerosis. No enlarged abdominal or pelvic lymph nodes. Reproductive: Uterus and bilateral adnexa are unremarkable. Other: No free fluid is noted. Musculoskeletal: Multiple sclerotic foci are identified throughout the pelvis similar to that seen on prior exam consistent with metastatic disease. Similar changes are noted in the lumbar spine and visualized ribcage. No compression deformity is noted. IMPRESSION: Changes consistent with hepatic and skeletal metastatic disease. No acute fractures are seen. Some bladder wall thickening is noted particularly on the left consistent with the given clinical history of bladder carcinoma. Diffuse thickening in the sigmoid extending towards the rectum consistent with acute colitis stable from the recent CT. No perforation or abscess is identified. Electronically Signed   By: Alcide Clever M.D.   On: 11/12/2021 20:19   NM PET Image Restag (PS) Skull Base To Thigh  Addendum Date: 11/12/2021   ADDENDUM REPORT: 11/12/2021 16:16 ADDENDUM: There is also increased metabolic activity in the spleen which is favored to represent systemic inflammatory response rather than tumor given absence of visible lesion and no change in splenic size. Suggest attention on follow-up. This may be related to ongoing marked inflammation of the rectosigmoid colon. Electronically Signed   By: Donzetta Kohut M.D.   On: 11/12/2021 16:16   Result Date: 11/12/2021 CLINICAL DATA:  Subsequent treatment strategy for bladder cancer in a  71 year old female. EXAM: NUCLEAR MEDICINE PET SKULL BASE TO THIGH TECHNIQUE: 7.31 mCi F-18 FDG was injected intravenously. Full-ring PET imaging was performed from the skull base to thigh after the radiotracer. CT data was obtained and used for attenuation correction and anatomic localization. Fasting blood glucose: 109 mg/dl COMPARISON:  CT angiography of the chest and PET evaluation from March of 2023. FINDINGS: Mediastinal blood pool activity: SUV max 1.80 Liver activity: SUV max NA NECK: No discrete adenopathy in the neck with hypermetabolic features. Incidental CT findings: none CHEST: No adenopathy in the chest or suspicious pulmonary nodules. Incidental CT findings: RIGHT-sided Port-A-Cath terminates at the upper portion of the RIGHT atrium at the caval to atrial junction. Calcified coronary artery disease. Aberrant RIGHT subclavian. Calcified atherosclerosis. Normal heart size without pericardial effusion. No adenopathy by size criteria in the chest. ABDOMEN/PELVIS: Large bladder mass which was present on previous imaging is no longer visible though the urinary bladder is collapsed. LEFT para-aortic lymph node (image 119/4) 6 mm as compared to 5 mm, not previously hypermetabolic now with a maximum SUV of 4.67. Signs of hepatic metastatic disease have nearly completely resolved, marked interval improvement. Area with greatest hypermetabolic activity with a maximum SUV of 5.68 previously as high as 14. Subtle low level activity only slightly greater than hepatic parenchyma may be indicative of residual uptake within lesions which are not well seen on CT, hepatic subsegment V/VIII 9 mm lesion previously as large as 21 mm. Spleen with greater FDG uptake than liver on today's exam, of uncertain significance. No visible lesion in the spleen in the spleen is stable in size. Incidental CT findings: Diffuse sigmoid thickening extending into the upper rectum with pericolonic and perirectal stranding in the setting of  diverticular disease with marked increased metabolic activity. Longer segment than expected for usual diverticulitis though still potentially related to diverticulitis. Marked increased metabolic activity associated with this finding maximum SUV 18.7.  No acute findings relative liver, gallbladder, spleen, pancreas, adrenal glands, kidneys, stomach and small bowel. Pancolonic diverticulosis. Appendix not visible without secondary signs to suggest acute appendiceal process. Aortic atherosclerosis without aneurysm. No adenopathy by size criteria in the retroperitoneum. SKELETON: Diffuse skeletal metastases. Mixed response with respect to skeletal metastatic disease. CT appearance is largely similar though with increasing sclerosis of many areas. Metabolic activity is diminished in the lumbar spine and has developed in the interval at T12 for example. Maximum SUV at T12 of 5.97 also with activity at T9 the may be slightly increased. Diffuse marrow space activity in the bilateral proximal femora. Separate from areas of metastatic disease that were seen previously. Also with marrow space activity in the bilateral proximal humeri. Increased metabolic activity in the cervical spine in general, at multiple levels not present previously. Discrete metastatic focus above the RIGHT acetabulum and LEFT acetabulum in the LEFT iliac and RIGHT iliac bones show substantial improvement. Maximum SUV currently in this area of 3.81 as compared to 14.85. Increasing sclerosis of RIGHT femoral neck lesion in many of the other pelvic lesions seen on previous imaging. Incidental CT findings: None IMPRESSION: Signs of acute colitis and or marked diverticulitis of the sigmoid and upper rectum with intense surrounding stranding and increased metabolic activity. Correlation with patient symptoms and with any risk factors for infectious colitis such as recent antibiotic administration is suggested. Interval improvement with respect to disease in  the liver still with residual metabolic activity at low levels in focal areas in the liver. No hypermetabolic lymph nodes in the chest but with new increased metabolism in the LEFT retroperitoneal lymph node. Extensive bony metastases with mixed response to therapy. There is diffuse increased metabolic activity within visualized marrow spaces, for example symmetric in the bilateral proximal femora without corresponding CT lesions, also seen in bilateral proximal humeri and in marrow spaces throughout the spine. Diffuse metastases are considered in addition to, if applicable marrow space stimulation. This more likely represents a mixed picture of marrow space stimulation of present and worsening of metastatic disease. In the absence of marrow space stimulation this represents substantial worsening of metastatic disease. Increasingly sclerotic appearance of many of the skeletal lesions may reflect some bony healing. Diminished activity most notably in the lumbar spine may relate to interval radiotherapy. These results were called by telephone at the time of interpretation on 11/12/2021 at 4:07 pm to provider [facility] , who verbally acknowledged these results. Electronically Signed: By: Donzetta Kohut M.D. On: 11/12/2021 16:07   DG Abd 2 Views  Result Date: 10/19/2021 CLINICAL DATA:  71 year old female with abdominal pain after radiation treatment. EXAM: ABDOMEN - 2 VIEW COMPARISON:  08/02/2021 FINDINGS: The bowel gas pattern is normal. There is no evidence of free air. No radio-opaque calculi or other significant radiographic abnormality is seen. Similar sigmoid curvature of the thoracolumbar spine. IMPRESSION: Nonobstructive bowel gas pattern.  No pneumoperitoneum. Electronically Signed   By: Marliss Coots M.D.   On: 10/19/2021 09:14    Assessment and Plan:  1.  Metastatic bladder cancer 2.  Normocytic anemia 3.  Colitis/diverticulitis 4.  Urinary tract infection 5.  Hypertension 6.   Hyperlipidemia 7.  GERD  -Restaging PET scan overall shows a mixed response to therapy.  She has improvement in her liver as well as some improvement in the bone lesions.  However, there is a new left retroperitoneal lymph node as well as possible worsening lesions in the bones.  She will be seen by Dr.  Ancelmo Hunt will determine further systemic treatment options once she recovers from her acute issues. -The patient was significantly anemic upon admission.  May be due to chemotherapy.  She does not notice any obvious bleeding.  Agree with checking stool for occult blood.  Transfuse to keep hemoglobin above 8. -Antibiotics per primary team for colitis/diverticulitis and UTI. -Management of chronic medical conditions per hospitalist.  Clenton Pare, DNP, AGPCNP-BC, AOCNP   ADDENDUM: I saw and examined Ms. Cheryl Singh.  She is admitted with the colitis.  She is on no treatment that leads to colitis.  She is not on any immunotherapy which can certainly cause colitis.  I think that the PET scan is certainly improvement.  She is only having the pain in the abdomen at.  She is anemic.  She has been anemic before.  Her hemoglobin is 7.7.  White cell count 17.7.  Platelet count 301,000.  She is on antibiotics.  I am unsure if she needs to have any count of colonoscopy to look at the sigmoid colon.  We may have to think about a transfusion for her if her hemoglobin drops further.  Her potassium is low but on the low side at 3.2.  Magnesium is 1.3.  I probably would have to replace both of these.  Her albumin is quite low.  Her prealbumin is incredibly low.  This I think is probably from her not eating.  She has been very inactive because of the abdominal discomfort.  Hopefully, I would believe that the pain will improve with treatment of the colitis.  This may take several days.  I do appreciate the outstanding care from the staff upon 6 E.  Christin Bach, MD  Jeri Modena 17:14

## 2021-11-13 NOTE — Evaluation (Signed)
Occupational Therapy Evaluation ?Patient Details ?Name: Cheryl Singh ?MRN: [id number] ?DOB: 1951-01-18 ?Today's Date: 11/13/2021 ? ? ?History of Present Illness Patient is a 71 year old female who presented to the hosptial from cancer center with severe abdominal pain and  concerns for diverticulitis.   PMH: bladder CA, HTN, HLD  ? ?Clinical Impression ?  ?Patient evaluated by Occupational Therapy with no further acute OT needs identified. All education has been completed and the patient has no further questions. Patient has 24/7 caregiver SUP at home. Patient is at baseline for ADLs at this time with SUP with occasional safety cues needed.  Patient was educated on getting pulse Ox to monitor O2 at home. See below for any follow-up Occupational Therapy or equipment needs. OT is signing off. Thank you for this referral. ?  ?   ? ?Recommendations for follow up therapy are one component of a multi-disciplinary discharge planning process, led by the attending physician.  Recommendations may be updated based on patient status, additional functional criteria and insurance authorization.  ? ?Follow Up Recommendations ? No OT follow up  ?  ?Assistance Recommended at Discharge Frequent or constant Supervision/Assistance  ?Patient can return home with the following A lot of help with bathing/dressing/bathroom;Direct supervision/assist for medications management;Direct supervision/assist for financial management;Assist for transportation;Help with stairs or ramp for entrance;Assistance with cooking/housework ? ?  ?Functional Status Assessment ? Patient has not had a recent decline in their functional status  ?Equipment Recommendations ? None recommended by OT  ?  ?Recommendations for Other Services   ? ? ?  ?Precautions / Restrictions Precautions ?Precaution Comments: monitor HR and O2 ?Restrictions ?Weight Bearing Restrictions: No  ? ?  ? ?Mobility Bed Mobility ?Overal bed mobility: Modified Independent ?  ?  ?  ?  ?  ?  ?   ?  ? ? ?  ? ?  ?Balance Overall balance assessment: No apparent balance deficits (not formally assessed) ?  ?  ?  ?   ? ?ADL either performed or assessed with clinical judgement  ? ?ADL Overall ADL's : At baseline ?  ?  ?  ?  ?General ADL Comments: patient is at baseline for ADLs at this time. patient noted to have some shortness of breathwith patient able to carryover deep breathing strategies from last admission. patient was educated on importance of getting up out of bed with nursing if she will be in hosptial for some time. patient verbalized undersatnding and reported she walked to restroom with nursing this AM. patient completed transfers, LB dressing, functional mobility and bed mobility at SUP with no physical support safety cues at times on this date. patient endorsed being at baseline for ADLs and not needing OT  ? ? ? ?Vision Baseline Vision/History: 1 Wears glasses ?Patient Visual Report: No change from baseline ?   ?   ?Perception   ?  ?Praxis   ?  ? ?Pertinent Vitals/Pain Pain Assessment ?Pain Assessment: Faces ?Faces Pain Scale: Hurts a little bit ?Pain Location: abdomen and back with movement ?Pain Descriptors / Indicators: Grimacing, Discomfort ?Pain Intervention(s): Monitored during session, Repositioned  ? ? ? ?Hand Dominance Left ?  ?Extremity/Trunk Assessment Upper Extremity Assessment ?Upper Extremity Assessment: Overall WFL for tasks assessed ?  ?Lower Extremity Assessment ?Lower Extremity Assessment: Defer to PT evaluation ?  ?Cervical / Trunk Assessment ?Cervical / Trunk Assessment: Normal ?  ?Communication Communication ?Communication: No difficulties ?  ?Cognition Arousal/Alertness: Awake/alert ?Behavior During Therapy: [facility] for tasks assessed/performed ?Overall Cognitive Status:  Within Functional Limits for tasks assessed ?  ?  ?   ?General Comments    ? ?  ?Exercises   ?  ?Shoulder Instructions    ? ? ?Home Living Family/patient expects to be discharged to:: Private residence ?Living  Arrangements: Other (Comment) (caregiver) ?Available Help at Discharge: Friend(s);Available PRN/intermittently ?Type of Home: House ?Home Access: Stairs to enter ?Entrance Stairs-Number of Steps: threshold ?  ?  ?  ?  ?Bathroom Shower/Tub: Walk-in shower ?  ?  ?  ?  ?Home Equipment: Rolling Walker (2 wheels);Grab bars - tub/shower;Tub bench;Wheelchair - manual ?  ?  ?  ? ?  ?Prior Functioning/Environment Prior Level of Function : Independent/Modified Independent ?  ?  ?  ?  ?  ?  ?Mobility Comments: patient reported using wheelchair for longer distances in community. patient used RW in house with some furniture walking at times ?ADLs Comments: patient reported she was bathing at sink while handicap shower was being built. patient has caregiver at home with her 24/7 ?  ? ?  ?  ?OT Problem List:   ?  ?   ?OT Treatment/Interventions:    ?  ?OT Goals(Current goals can be found in the care plan section) Acute Rehab OT Goals ?Patient Stated Goal: to go back home ?OT Goal Formulation: With patient ?Time For Goal Achievement: 11/27/21  ?OT Frequency:   ?  ? ?Co-evaluation PT/OT/SLP Co-Evaluation/Treatment: Yes ?Reason for Co-Treatment: To address functional/ADL transfers ?PT goals addressed during session: Mobility/safety with mobility ?OT goals addressed during session: ADL's and self-care ?  ? ?  ?AM-PAC OT "6 Clicks" Daily Activity     ?Outcome Measure Help from another person eating meals?: None ?Help from another person taking care of personal grooming?: None ?Help from another person toileting, which includes using toliet, bedpan, or urinal?: A Little (SUP for safety) ?Help from another person bathing (including washing, rinsing, drying)?: A Little ?Help from another person to put on and taking off regular upper body clothing?: A Little ?Help from another person to put on and taking off regular lower body clothing?: A Little ?6 Click Score: 20 ?  ?End of Session Equipment Utilized During Treatment: Rolling walker (2  wheels) ? ?Activity Tolerance: Patient tolerated treatment well ?Patient left: in bed;with call bell/phone within reach ? ?OT Visit Diagnosis: Unsteadiness on feet (R26.81)  ?              ?Time: 1610-9604 ?OT Time Calculation (min): 14 min ?Charges:  OT General Charges ?$OT Visit: 1 Visit ?OT Evaluation ?$OT Eval Low Complexity: 1 Low ? ?Kathlen Sakurai OTR/L, MS ?Acute Rehabilitation Department ?Office# [phone] ?Pager# [phone] ? ? ?[place] ?11/13/2021, 8:35 AM ?

## 2021-11-13 NOTE — ED Notes (Signed)
Patient ambulated to the bathroom with assistance.

## 2021-11-13 NOTE — Progress Notes (Signed)
PET showed diverticulitis and patient was sent to the ED. She will be admitted for management. Cancelled her appointments for today. She was due to receive her next cycle of treatment. Will follow for post discharge needs and office follow up.  ? ?Oncology Nurse Navigator Documentation ? ? ?  11/13/2021  ?  7:45 AM  ?Oncology Nurse Navigator Flowsheets  ?Navigator Follow Up Date: 11/19/2021  ?Navigator Follow Up Reason: Appointment Review  ?Navigator Location [facility]  ?Navigator Encounter Type Scan Review;Appt/Treatment Plan Review  ?Patient Visit Type MedOnc  ?Treatment Phase Active Tx  ?Barriers/Navigation Needs Coordination of Care;Education  ?Interventions None Required  ?Acuity Level 2-Minimal Needs (1-2 Barriers Identified)  ?Support Groups/Services Friends and Family  ?Time Spent with Patient 15  ?  ?

## 2021-11-13 NOTE — Progress Notes (Signed)
?  Transition of Care (TOC) Screening Note ? ? ?Patient Details  ?Name: Cheryl Singh ?Date of Birth: [birth year] ? ? ?Transition of Care (TOC) CM/SW Contact:    ?Davieon Stockham, Marjie Skiff, RN ?Phone Number: ?11/13/2021, 2:51 PM ? ? ? ?Transition of [facility]) has reviewed patient and no TOC needs have been identified at this time. We will continue to monitor patient advancement through interdisciplinary progression rounds. If new patient transition needs arise, please place a TOC consult. ?  ?

## 2021-11-13 NOTE — Progress Notes (Signed)
?Progress Note ? ? ?Patient: Cheryl Singh ZDG:[id number] DOB: 04/02/1951 DOA: 11/12/2021     1 ?DOS: the patient was seen and examined on 11/13/2021 ?  ?Brief hospital course: ?HPI on admission:  ?Cheryl Singh is a 70 y.o. female with medical history significant of bladder cancer on chemotherapy (follows with Dr. Marin Olp), HLD, HTN who presented to the ED on evening of 11/12/2021 due to abnormal imaging after having an outpatient PET scan that showed findings of colitis as well as skeletal metastatic disease.  On admission, patient reported significant left lower abdominal pain for a few weeks but no fevers or diarrhea.  States that had recently gotten a little bit better.  Denies any dark or red bloody stools. ?  ?Admitted to the hospital with colitis, on IV antibiotics and IV fluids. ?  ? ?Assessment and Plan: ? ?Colitis -cause unclear.  Few weeks of abdominal pain had reportedly improved recently.  Findings of colitis were seen on PET scan and patient was referred to the ED for evaluation. ?--Continue Zosyn ?--Bowel rest -clear liquid diet for now ?--Continue IV fluids, reduce to 75 cc/h ?--Stool studies if diarrhea recurs ? ?Acute respiratory failure with hypoxia -unclear etiology.  Patient reports being placed on oxygen every time she is admitted.  She has mild shortness of breath but no other respiratory symptoms, no chest pain.  Given recent contrast CT, CTA was deferred at time of admission.  Chest x-ray was normal. ?-- Further evaluation if unable to wean including rule out PE given malignancy ?--Wean oxygen as tolerated, maintain sat above 90% ? ?UTI -on Zosyn for colitis.  Follow urine culture. ? ?Hypokalemia -replaced on admission. ?Hypomagnesemia -replaced on admission ?--Monitor BMP, Mg and replace as needed ? ?Metastatic bladder cancer -patient follows with Dr. Marin Olp who was notified by admitting hospitalist of this admission, on chemotherapy.  Had outpatient PET scan that showed mets to  liver and bones.  Management per oncology.  Pain control as needed. ? ?Symptomatic anemia -hemoglobin 7.8 on admission, down from 11.5 on 10/30/2021.  Transfused 1 unit PRBCs on admission. ?Hemoglobin today 8.3 ?--Monitor hemoglobin, transfuse if less than 7 or signs of active bleeding ? ?Hypertension -antihypertensives held on admission, today BPs are well controlled.  Resumed on Cardizem 60 mg 3 times daily. ? ?Hyperlipidemia -continue Lipitor ? ?GERD -continue PPI ? ? ? ?  ? ?Subjective: Patient seen in the ED holding for a bed this morning.  She was requesting more to drink specifically ginger ale or apple juice she tolerates best.  Reports some intermittent bouts of abdominal pain but no nausea or vomiting.  No diarrhea.  Reports yesterday noticing darkened skin on her belly.  Said she had been using a heating pad at home.  Otherwise denies any acute complaints at this time.  No acute events reported. ? ?Physical Exam: ?Vitals:  ? 11/13/21 1139 11/13/21 1200 11/13/21 1230 11/13/21 1438  ?BP: 129/81 129/84 123/68 135/87  ?Pulse: 94 91 88 87  ?Resp: 16 (!) 34 20 14  ?Temp:  98.6 ?F (37 ?C)  97.6 ?F (36.4 ?C)  ?TempSrc:  Oral  Oral  ?SpO2: 98% 97% 92% 99%  ? ?General exam: awake, alert, no acute distress ?HEENT: moist mucus membranes, hearing grossly normal  ?Respiratory system: CTAB, no wheezes, rales or rhonchi, normal respiratory effort. ?Cardiovascular system: normal S1/S2, RRR, no pedal edema.   ?Gastrointestinal system: soft, NT, ND, no HSM felt, +bowel sounds. ?Central nervous system: A&O x4. no gross focal neurologic  deficits, normal speech ?Extremities: moves all, no edema, normal tone ?Skin: Skin on the lower abdomen with patchy hyperpigmentation, dry, intact, normal temperature ?Psychiatry: normal mood, congruent affect, judgement and insight appear normal ? ? ?Data Reviewed: ? ?Notable labs: Sodium 130, chloride 95, calcium 7.6 with albumin 2.0, total protein 5.2, prealbumin less than 5, white count  22.4, hemoglobin 8.3 ? ?Family Communication: None at bedside, will attempt to call ? ?Disposition: ?Status is: Inpatient ?Remains inpatient appropriate because: Remains on IV antibiotics and IV fluids as outlined above for colitis.  Electrolyte derangements that require close monitoring in case of further replacement required. ? ? Planned Discharge Destination: Home ? ? ? ?Time spent: 35 minutes ? ?Author: ?Ezekiel Slocumb, DO ?11/13/2021 4:50 PM ? ?For on call review [email or web address].  ?

## 2021-11-13 NOTE — Evaluation (Signed)
Physical Therapy Evaluation ?Patient Details ?Name: Cheryl Singh ?MRN: [id number] ?DOB: July 01, 1951 ?Today's Date: 11/13/2021 ? ?History of Present Illness ? Patient is a 71 year old female who presented to the hosptial from cancer center with severe abdominal pain and  concerns for diverticulitis.   PMH: bladder CA, HTN, HLD  ?Clinical Impression ? The patient  is ambulating with supervision, used RW, reports furniture walks at home since last admission. No noted balance losses. Patient has assistance at home. Patient  able to ambulate with staff while in hospital. No further  acute PT needs at this time. Encouraged patient to  ask for  ambulation frequently from staff. Patient's HR  noted  to vary from  110-137 with activity. Patient's SPO2 87% on RA at end of walk with improvement with PLB and replaced  2 L Circle.  ? PT signing off at this time..   ?   ? ?Recommendations for follow up therapy are one component of a multi-disciplinary discharge planning process, led by the attending physician.  Recommendations may be updated based on patient status, additional functional criteria and insurance authorization. ? ?Follow Up Recommendations No PT follow up ? ?  ?Assistance Recommended at Discharge PRN  ?Patient can return home with the following ? Assistance with cooking/housework;Assist for transportation;Help with stairs or ramp for entrance ? ?  ?Equipment Recommendations None recommended by PT  ?Recommendations for Other Services ?    ?  ?Functional Status Assessment Patient has not had a recent decline in their functional status  ? ?  ?Precautions / Restrictions Precautions ?Precaution Comments: monitor HR and O2 ?Restrictions ?Weight Bearing Restrictions: No  ? ?  ? ?Mobility ? Bed Mobility ?Overal bed mobility: Modified Independent ?  ?  ?  ?  ?  ?  ?  ?  ? ?Transfers ?Overall transfer level: Needs assistance ?Equipment used: Rolling walker (2 wheels) ?Transfers: Sit to/from Stand ?Sit to Stand: Supervision ?  ?   ?  ?  ?  ?  ?  ? ?Ambulation/Gait ?Ambulation/Gait assistance: Supervision ?Gait Distance (Feet): 250 Feet ?Assistive device: Rolling walker (2 wheels) ?Gait Pattern/deviations: Step-through pattern ?  ?  ?  ?General Gait Details: gait  is steady ? ?Stairs ?  ?  ?  ?  ?  ? ?Wheelchair Mobility ?  ? ?Modified Rankin (Stroke Patients Only) ?  ? ?  ? ?Balance Overall balance assessment: No apparent balance deficits (not formally assessed) ?  ?  ?  ?  ?  ?  ?  ?  ?  ?  ?  ?  ?  ?  ?  ?  ?  ?  ?   ? ? ? ?Pertinent Vitals/Pain Pain Assessment ?Faces Pain Scale: Hurts a little bit ?Pain Location: abdomen and back with movement ?Pain Descriptors / Indicators: Grimacing, Discomfort ?Pain Intervention(s): Monitored during session  ? ? ?Home Living Family/patient expects to be discharged to:: Private residence ?Living Arrangements: Other (Comment) (caregiver) ?Available Help at Discharge: Friend(s);Available PRN/intermittently ?Type of Home: House ?Home Access: Stairs to enter ?  ?Entrance Stairs-Number of Steps: threshold ?  ?  ?Home Equipment: Rolling Walker (2 wheels);Grab bars - tub/shower;Tub bench;Wheelchair - manual ?   ?  ?Prior Function Prior Level of Function : Independent/Modified Independent ?  ?  ?  ?  ?  ?  ?Mobility Comments: patient reported using wheelchair for longer distances in community. patient used RW in house with some furniture walking at times ?ADLs Comments: patient reported she  was bathing at sink while handicap shower was being built. patient has caregiver at home with her 24/7 ?  ? ? ?Hand Dominance  ? Dominant Hand: Left ? ?  ?Extremity/Trunk Assessment  ? Upper Extremity Assessment ?Upper Extremity Assessment: Overall WFL for tasks assessed ?  ? ?Lower Extremity Assessment ?Lower Extremity Assessment: Overall WFL for tasks assessed ?  ? ?Cervical / Trunk Assessment ?Cervical / Trunk Assessment: Normal  ?Communication  ? Communication: No difficulties  ?Cognition   ?  ?  ?  ?  ?  ?  ?  ?  ?  ?   ?  ?  ?  ?  ?  ?  ?  ?  ?  ?  ?  ? ?  ?General Comments   ? ?  ?Exercises    ? ?Assessment/Plan  ?  ?PT Assessment Patient does not need any further PT services  ?PT Problem List Decreased strength;Decreased activity tolerance;Decreased mobility ? ?   ?  ?PT Treatment Interventions DME instruction;Therapeutic activities;Gait training;Therapeutic exercise;Patient/family education;Functional mobility training   ? ?PT Goals (Current goals can be found in the Care Plan section)  ?Acute Rehab PT Goals ?Patient Stated Goal: go home ?PT Goal Formulation: All assessment and education complete, DC therapy ?Time For Goal Achievement: 11/27/21 ? ?  ?Frequency Min 2X/week ?  ? ? ?Co-evaluation PT/OT/SLP Co-Evaluation/Treatment: Yes ?Reason for Co-Treatment: To address functional/ADL transfers ?PT goals addressed during session: Mobility/safety with mobility ?OT goals addressed during session: ADL's and self-care ?  ? ? ?  ?AM-PAC PT "6 Clicks" Mobility  ?Outcome Measure Help needed turning from your back to your side while in a flat bed without using bedrails?: None ?Help needed moving from lying on your back to sitting on the side of a flat bed without using bedrails?: None ?Help needed moving to and from a bed to a chair (including a wheelchair)?: A Little ?Help needed standing up from a chair using your arms (e.g., wheelchair or bedside chair)?: A Little ?Help needed to walk in hospital room?: A Little ?Help needed climbing 3-5 steps with a railing? : A Little ?6 Click Score: 20 ? ?  ?End of Session   ?Activity Tolerance: Patient tolerated treatment well ?Patient left: in bed;with call bell/phone within reach ?Nurse Communication: Mobility status ?PT Visit Diagnosis: Difficulty in walking, not elsewhere classified (R26.2) ?  ? ?Time: 8469-6295 ?PT Time Calculation (min) (ACUTE ONLY): 13 min ? ? ?Charges:   PT Evaluation ?$PT Eval Low Complexity: 1 Low ?  ?  ?   ? ? ?Tresa Endo PT ?Acute Rehabilitation Services ?Pager  [phone] ?Office [phone] ? ? ?Zoye Chandra, Shella Maxim ?11/13/2021, 10:13 AM ?

## 2021-11-14 DIAGNOSIS — K529 Noninfective gastroenteritis and colitis, unspecified: Secondary | ICD-10-CM | POA: Diagnosis not present

## 2021-11-14 LAB — CBC
HCT: 23.2 % — ABNORMAL LOW (ref 36.0–46.0)
Hemoglobin: 7.7 g/dL — ABNORMAL LOW (ref 12.0–15.0)
MCH: 30.1 pg (ref 26.0–34.0)
MCHC: 33.2 g/dL (ref 30.0–36.0)
MCV: 90.6 fL (ref 80.0–100.0)
Platelets: 301 10*3/uL (ref 150–400)
RBC: 2.56 MIL/uL — ABNORMAL LOW (ref 3.87–5.11)
RDW: 16.2 % — ABNORMAL HIGH (ref 11.5–15.5)
WBC: 17.7 10*3/uL — ABNORMAL HIGH (ref 4.0–10.5)
nRBC: 0.6 % — ABNORMAL HIGH (ref 0.0–0.2)

## 2021-11-14 LAB — TYPE AND SCREEN
ABO/RH(D): AB POS
Antibody Screen: NEGATIVE
Unit division: 0

## 2021-11-14 LAB — BASIC METABOLIC PANEL
Anion gap: 8 (ref 5–15)
BUN: 10 mg/dL (ref 8–23)
CO2: 26 mmol/L (ref 22–32)
Calcium: 7.7 mg/dL — ABNORMAL LOW (ref 8.9–10.3)
Chloride: 98 mmol/L (ref 98–111)
Creatinine, Ser: 0.71 mg/dL (ref 0.44–1.00)
GFR, Estimated: >60 mL/min (ref >60)
Glucose, Bld: 90 mg/dL (ref 70–99)
Potassium: 3.2 mmol/L — ABNORMAL LOW (ref 3.5–5.1)
Sodium: 132 mmol/L — ABNORMAL LOW (ref 135–145)

## 2021-11-14 LAB — BPAM RBC
Blood Product Expiration Date: 202306082359
ISSUE DATE / TIME: 202305160028
Unit Type and Rh: 6200

## 2021-11-14 LAB — MAGNESIUM: Magnesium: 1.3 mg/dL — ABNORMAL LOW (ref 1.7–2.4)

## 2021-11-14 MED ORDER — POTASSIUM CHLORIDE 10 MEQ/50ML IV SOLN
10.0000 meq | INTRAVENOUS | Status: AC
  Administered 2021-11-14 (×4): 10 meq via INTRAVENOUS
  Filled 2021-11-14 (×4): qty 50

## 2021-11-14 MED ORDER — MAGNESIUM SULFATE 2 GM/50ML IV SOLN
2.0000 g | Freq: Once | INTRAVENOUS | Status: AC
  Administered 2021-11-14: 2 g via INTRAVENOUS
  Filled 2021-11-14: qty 50

## 2021-11-14 MED ORDER — SODIUM CHLORIDE 0.9 % IV SOLN: INTRAVENOUS | Status: DC

## 2021-11-14 NOTE — Progress Notes (Signed)
Initial Nutrition Assessment ? ?DOCUMENTATION CODES:  ? ?Non-severe (moderate) malnutrition in context of chronic illness ? ?INTERVENTION:  ? ?-Boost Breeze po TID, each supplement provides 250 kcal and 9 grams of protein  ? ?-Once diet advanced: Ensure Plus High Protein po BID, each supplement provides 350 kcal and 20 grams of protein.  ? ?-Multivitamin with minerals daily ? ?NUTRITION DIAGNOSIS:  ? ?Moderate Malnutrition related to chronic illness, cancer and cancer related treatments as evidenced by mild fat depletion, mild muscle depletion, percent weight loss. ? ?GOAL:  ? ?Patient will meet greater than or equal to 90% of their needs ? ?MONITOR:  ? ?PO intake, Supplement acceptance, Labs, Weight trends, I & O's ? ?REASON FOR ASSESSMENT:  ? ?Consult ?Assessment of nutrition requirement/status ? ?ASSESSMENT:  ? ?71 y.o. female with medical history significant of bladder cancer on chemotherapy (follows with Dr. Marin Olp), HLD, HTN who presented to the ED on evening of 11/12/2021 due to abnormal imaging after having an outpatient PET scan that showed findings of colitis as well as skeletal metastatic disease. Admitted to the hospital with colitis, on IV antibiotics and IV fluids. ? ?Patient in room, lying in bed. Reports she has had poor appetite since starting chemotherapy. Could not recall the last time she ate a regular meal. Has been snacking on "light foods". Likes Ensure supplements. States she has been drinking [employer] while on clears. Denies chewing or swallowing. Reports taste changes r/t chemo. ? ?Pt states she thinks she has lost 20 lbs. Per weight records, pt has lost 16 lbs since 2/16 (9% wt loss  x 3 months, significant for time frame).  ? ?Medications: Lactated ringers, IV Mg sulfate, KCl ? ?Labs reviewed: ? Low Na, K, Mg ? ?NUTRITION - FOCUSED PHYSICAL EXAM: ? ?Flowsheet Row Most Recent Value  ?Orbital Region Mild depletion  ?Upper Arm Region Moderate depletion  ?Thoracic and Lumbar Region Mild  depletion  ?Buccal Region Mild depletion  ?Temple Region Mild depletion  ?Clavicle Bone Region Mild depletion  ?Clavicle and Acromion Bone Region Mild depletion  ?Scapular Bone Region No depletion  ?Dorsal Hand No depletion  ?Patellar Region No depletion  ?Anterior Thigh Region No depletion  ?Posterior Calf Region No depletion  ?Edema (RD Assessment) None  ?Hair Reviewed  ?Eyes Reviewed  ?Mouth Reviewed  ?Skin Reviewed  ? ?  ? ? ?Diet Order:   ?Diet Order   ? ?       ?  Diet clear liquid Room service appropriate? Yes; Fluid consistency: Thin  Diet effective now       ?  ? ?  ?  ? ?  ? ? ?EDUCATION NEEDS:  ? ?No education needs have been identified at this time ? ?Skin:  Skin Assessment: Reviewed RN Assessment ? ?Last BM:  5/17 ? ?Height:  ? ?Ht Readings from Last 1 Encounters:  ?11/13/21 5' 4.5" (1.638 m)  ? ? ?Weight:  ? ?Wt Readings from Last 1 Encounters:  ?11/13/21 68.8 kg  ? ? ?BMI:  Body mass index is 25.63 kg/m?. ? ?Estimated Nutritional Needs:  ? ?Kcal:  2000-2200 ? ?Protein:  100-110g ? ?Fluid:  2L/day ? ?Clayton Bibles, MS, RD, LDN ?Inpatient Clinical Dietitian ?Contact information available via Amion ? ?

## 2021-11-14 NOTE — Progress Notes (Signed)
?PROGRESS NOTE ? ? ? ?Cheryl Singh  TML:[id number] DOB: 1950-09-07 DOA: 11/12/2021 ?PCP: Glenis Smoker, MD  ? ?Brief Narrative: 71 year old female with history of hyper lipidemia hypertension anemia and metastatic bladder cancer admitted with abdominal pain ?CT scan of the abdomen shows diffuse thickening in the sigmoid extending towards the rectum consistent with acute colitis.  No perforation or abscess.  She also had hepatic and skeletal metastatic disease and bladder wall thickening consistent with her history of bladder cancer.  Recent PET scan done as outpatient on 11/09/2021 showed acute colitis and diverticulitis of the sigmoid and upper rectum.  Extensive bony metastasis with mixed response were seen. ?She lives at home with her caregiver. ?Assessment & Plan: ?  ?Principal Problem: ?  Colitis ?Active Problems: ?  Acute respiratory failure with hypoxia ([place]) ?  Hypomagnesemia ?  UTI (urinary tract infection) ?  Symptomatic anemia ?  HTN (hypertension) ?  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  HLD (hyperlipidemia) ?  GERD (gastroesophageal reflux disease) ?  Hypokalemia ? ? ?#1 acute colitis patient admitted with severe abdominal pain and decreased p.o. intake.  CT of the abdomen shows acute colitis of the sigmoid and the rectal area. ?Patient has been on clear liquid diet and IV Zosyn. ?Still with leukocytosis though improved ?She is requesting diet to be advanced however she also reports severe abdominal pain for which she is taking narcotics. ? ?#2 symptomatic iron deficiency anemia patient received 1 unit of packed RBC. ? ?#3 hypokalemia/hypomagnesemia replete and recheck labs in AM. ? ?#4 metastatic bladder cancer appreciate oncology input ? ?#5 history of essential hypertension pressure stable 132/84 ? ?#6 hyperlipidemia on Lipitor ? ?  ? ? ? ?Nutrition Problem: Moderate Malnutrition ?Etiology: chronic illness, cancer and cancer related treatments ? ? ? ? ?Signs/Symptoms: mild fat  depletion, mild muscle depletion, percent weight loss ? ? ? ?Interventions: Boost Breeze, [employer] (each supplement provides 350kcal and 20 grams of protein), MVI ? ?Estimated body mass index is 25.63 kg/m? as calculated from the following: ?  Height as of this encounter: 5' 4.5" (1.638 m). ?  Weight as of this encounter: 68.8 kg. ? ?DVT prophylaxis: scd ?Code Status: full ?Family Communication:none ?Disposition Plan:  Status is: Inpatient ?Remains inpatient appropriate because: Admitted with colitis on IV antibiotics and clear liquids ?  ?Consultants:  ?Oncology ? ?Procedures: None ?Antimicrobials: Zosyn ? ?Subjective: ?Reports abdominal pain is better after receiving pain medication ?Denies nausea or vomiting ? ?Objective: ?Vitals:  ? 11/13/21 1906 11/13/21 2024 11/14/21 6812 11/14/21 0514  ?BP: 133/70 132/78 136/78 132/84  ?Pulse: 88 89 97 91  ?Resp: '18 18  18  '$ ?Temp: 98.3 ?F (36.8 ?C) 98 ?F (36.7 ?C) 98.2 ?F (36.8 ?C) 99.3 ?F (37.4 ?C)  ?TempSrc: Oral Oral Oral Oral  ?SpO2: 99% 97% 98% 95%  ?Weight:      ?Height:      ? ? ?Intake/Output Summary (Last 24 hours) at 11/14/2021 1351 ?Last data filed at 11/13/2021 1424 ?Gross per 24 hour  ?Intake 753.37 ml  ?Output --  ?Net 753.37 ml  ? ?Filed Weights  ? 11/13/21 1438  ?Weight: 68.8 kg  ? ? ?Examination: ? ?General exam: Appears calm and comfortable  ?Respiratory system: Clear to auscultation. Respiratory effort normal. ?Cardiovascular system: S1 & S2 heard, RRR. No JVD, murmurs, rubs, gallops or clicks. No pedal edema. ?Gastrointestinal system: Abdomen is nondistended, soft and tender to touch.  No organomegaly or masses felt. Normal bowel sounds heard. ?Central nervous  system: Alert and oriented. No focal neurological deficits. ?Extremities: Symmetric 5 x 5 power. ?Skin: No rashes, lesions or ulcers ?Psychiatry: Judgement and insight appear normal. Mood & affect appropriate.  ? ? ? ?Data Reviewed: I have personally reviewed following labs and imaging  studies ? ?CBC: ?Recent Labs  ?Lab 11/12/21 ?1818 11/13/21 ?5573 11/14/21 ?0456  ?WBC 24.6* 22.4* 17.7*  ?NEUTROABS 18.1*  --   --   ?HGB 7.8* 8.3* 7.7*  ?HCT 23.2* 24.3* 23.2*  ?MCV 91.3 90.3 90.6  ?PLT 324 242 301  ? ?Basic Metabolic Panel: ?Recent Labs  ?Lab 11/12/21 ?1818 11/12/21 ?2236 11/13/21 ?2202 11/14/21 ?0456  ?NA 133*  --  130* 132*  ?K 3.0*  --  4.0 3.2*  ?CL 96*  --  95* 98  ?CO2 25  --  26 26  ?GLUCOSE 106*  --  92 90  ?BUN 21  --  19 10  ?CREATININE 0.98  --  0.93 0.71  ?CALCIUM 8.0*  --  7.6* 7.7*  ?MG  --  1.2* 1.9 1.3*  ?PHOS  --  3.4  --   --   ? ?GFR: ?Estimated Creatinine Clearance: 64 mL/min (by C-G formula based on SCr of 0.71 mg/dL). ?Liver Function Tests: ?Recent Labs  ?Lab 11/12/21 ?1818 11/12/21 ?2236 11/13/21 ?5427  ?AST '26 22 18  '$ ?ALT '27 24 22  '$ ?ALKPHOS 142* 130* 124  ?BILITOT 0.4 0.5 0.6  ?PROT 5.7* 5.3* 5.2*  ?ALBUMIN 2.2* 2.0* 2.0*  ? ?Recent Labs  ?Lab 11/12/21 ?1818  ?LIPASE 21  ? ?No results for input(s): AMMONIA in the last 168 hours. ?Coagulation Profile: ?No results for input(s): INR, PROTIME in the last 168 hours. ?Cardiac Enzymes: ?Recent Labs  ?Lab 11/12/21 ?2236  ?CKTOTAL 39  ? ?BNP (last 3 results) ?No results for input(s): PROBNP in the last 8760 hours. ?HbA1C: ?No results for input(s): HGBA1C in the last 72 hours. ?CBG: ?Recent Labs  ?Lab 11/09/21 ?1013  ?GLUCAP 109*  ? ?Lipid Profile: ?No results for input(s): CHOL, HDL, LDLCALC, TRIG, CHOLHDL, LDLDIRECT in the last 72 hours. ?Thyroid Function Tests: ?Recent Labs  ?  11/12/21 ?2236  ?TSH 2.038  ? ?Anemia Panel: ?Recent Labs  ?  11/12/21 ?2236  ?CWCBJSEG31 2,084*  ?FOLATE 14.5  ?FERRITIN 2,428*  ?TIBC 95*  ?IRON 24*  ?RETICCTPCT 2.2  ? ?Sepsis Labs: ?Recent Labs  ?Lab 11/12/21 ?2236  ?LATICACIDVEN 1.5  ? ? ?Recent Results (from the past 240 hour(s))  ?Culture, blood (routine x 2)     Status: None (Preliminary result)  ? Collection Time: 11/12/21  6:40 PM  ? Specimen: BLOOD  ?Result Value Ref Range Status  ? Specimen  Description   Final  ?  BLOOD PORTA CATH ?Performed at [facility], [place] [address], [place], [place] [address] ?  ? Special Requests   Final  ?  BOTTLES DRAWN AEROBIC AND ANAEROBIC Blood Culture adequate volume ?Performed at [facility], [place] [address], [place], [place] [address] ?  ? Culture   Final  ?  NO GROWTH 2 DAYS ?Performed at [facility], [place] [address], [place], [place] [address] ?  ? Report Status PENDING  Incomplete  ?Culture, blood (routine x 2)     Status: None (Preliminary result)  ? Collection Time: 11/13/21 12:10 AM  ? Specimen: BLOOD  ?Result Value Ref Range Status  ? Specimen Description   Final  ?  BLOOD RIGHT ANTECUBITAL ?Performed at [facility], [place]  [address] [place], [place] [address] ?  ? Special Requests   Final  ?  BOTTLES DRAWN AEROBIC AND ANAEROBIC Blood Culture adequate volume ?Performed at [facility], [place] [address] [place], [place] [address] ?  ? Culture   Final  ?  NO GROWTH 1 DAY ?Performed at [facility], [place] [address] [place], [place] [address] ?  ? Report Status PENDING  Incomplete  ?  ? ? ? ? ? ?Radiology Studies: ?DG Chest 2 View ? ?Result Date: 11/13/2021 ?CLINICAL DATA:  Abdominal pain, hypoxia, history of bladder cancer EXAM: CHEST - 2 VIEW COMPARISON:  09/20/2021 FINDINGS: Lungs are essentially clear.  No pleural effusion or pneumothorax. The heart is normal in size.  Thoracic aortic atherosclerosis. Right chest power port terminates at the cavoatrial junction. Visualized osseous structures are within normal limits. IMPRESSION: Normal chest radiographs. Electronically Signed   By: Julian Hy M.D.   On: 11/13/2021 00:28  ? ?CT Abdomen Pelvis W Contrast ? ?Result Date: 11/12/2021 ?CLINICAL DATA:  Acute abdominal pain on the left EXAM: CT ABDOMEN AND PELVIS WITH CONTRAST TECHNIQUE: Multidetector CT imaging of the abdomen and pelvis was performed using the standard protocol  following bolus administration of intravenous contrast. RADIATION DOSE REDUCTION: This exam was performed according to the departmental dose-optimization program which includes automated exposure control, adjustment of th

## 2021-11-15 DIAGNOSIS — K529 Noninfective gastroenteritis and colitis, unspecified: Secondary | ICD-10-CM | POA: Diagnosis not present

## 2021-11-15 DIAGNOSIS — E440 Moderate protein-calorie malnutrition: Secondary | ICD-10-CM | POA: Insufficient documentation

## 2021-11-15 LAB — CBC WITH DIFFERENTIAL/PLATELET
Abs Immature Granulocytes: 3.12 10*3/uL — ABNORMAL HIGH (ref 0.00–0.07)
Basophils Absolute: 0.1 10*3/uL (ref 0.0–0.1)
Basophils Relative: 1 %
Eosinophils Absolute: 0.1 10*3/uL (ref 0.0–0.5)
Eosinophils Relative: 0 %
HCT: 24.6 % — ABNORMAL LOW (ref 36.0–46.0)
Hemoglobin: 8.1 g/dL — ABNORMAL LOW (ref 12.0–15.0)
Immature Granulocytes: 20 %
Lymphocytes Relative: 4 %
Lymphs Abs: 0.6 10*3/uL — ABNORMAL LOW (ref 0.7–4.0)
MCH: 30.5 pg (ref 26.0–34.0)
MCHC: 32.9 g/dL (ref 30.0–36.0)
MCV: 92.5 fL (ref 80.0–100.0)
Monocytes Absolute: 2.9 10*3/uL — ABNORMAL HIGH (ref 0.1–1.0)
Monocytes Relative: 18 %
Neutro Abs: 9.1 10*3/uL — ABNORMAL HIGH (ref 1.7–7.7)
Neutrophils Relative %: 57 %
Platelets: 344 10*3/uL (ref 150–400)
RBC: 2.66 MIL/uL — ABNORMAL LOW (ref 3.87–5.11)
RDW: 16.7 % — ABNORMAL HIGH (ref 11.5–15.5)
WBC: 15.9 10*3/uL — ABNORMAL HIGH (ref 4.0–10.5)
nRBC: 0.4 % — ABNORMAL HIGH (ref 0.0–0.2)

## 2021-11-15 LAB — COMPREHENSIVE METABOLIC PANEL
ALT: 18 U/L (ref 0–44)
AST: 17 U/L (ref 15–41)
Albumin: 1.8 g/dL — ABNORMAL LOW (ref 3.5–5.0)
Alkaline Phosphatase: 122 U/L (ref 38–126)
Anion gap: 7 (ref 5–15)
BUN: 6 mg/dL — ABNORMAL LOW (ref 8–23)
CO2: 27 mmol/L (ref 22–32)
Calcium: 7.8 mg/dL — ABNORMAL LOW (ref 8.9–10.3)
Chloride: 101 mmol/L (ref 98–111)
Creatinine, Ser: 0.74 mg/dL (ref 0.44–1.00)
GFR, Estimated: >60 mL/min (ref >60)
Glucose, Bld: 81 mg/dL (ref 70–99)
Potassium: 3.5 mmol/L (ref 3.5–5.1)
Sodium: 135 mmol/L (ref 135–145)
Total Bilirubin: 0.4 mg/dL (ref 0.3–1.2)
Total Protein: 4.9 g/dL — ABNORMAL LOW (ref 6.5–8.1)

## 2021-11-15 LAB — MAGNESIUM: Magnesium: 1.9 mg/dL (ref 1.7–2.4)

## 2021-11-15 MED ORDER — ENSURE ENLIVE PO LIQD
237.0000 mL | Freq: Two times a day (BID) | ORAL | Status: DC
  Administered 2021-11-15 – 2021-11-22 (×10): 237 mL via ORAL

## 2021-11-15 MED ORDER — ADULT MULTIVITAMIN W/MINERALS CH
1.0000 | ORAL_TABLET | Freq: Every day | ORAL | Status: DC
  Administered 2021-11-15 – 2021-11-23 (×9): 1 via ORAL
  Filled 2021-11-15 (×9): qty 1

## 2021-11-15 NOTE — Progress Notes (Addendum)
She is feeling better.  There is not as much abdominal pain.  She is on Zosyn.  Cultures are all negative.  She is not having any diarrhea.  She would like try to have little more to eat.  Her labs show white count of 15.9.  Hemoglobin 8.1.  Platelet count 344,000.  Her magnesium is 1.9.  Creatinine is 0.74.  She is going to try to get out of bed today a little bit.  I think this will be very important.  There is been no fever.  Her vital signs show temperature of 97.4.  Pulse 91.  Blood pressure 146/84.  Her lungs are clear bilaterally.  Cardiac exam shows regular rate and rhythm.  She has occasional extra beat.  Abdomen is soft.  Bowel sounds are slightly decreased.  She has no guarding or rebound tenderness.  Extremity shows no clubbing, cyanosis or edema.  Neurological exam is nonfocal.  Ms. Haston has colitis.  It sounds like this is probably diverticulitis.  She has diverticulosis on her last colonoscopy.  She is on Zosyn.  This does seem to be helping her.  Hopefully, she will be able to have a little bit more to eat today.  It really be nice to see how she does with getting out of bed and ambulating her sitting in a chair.  I know that she is getting wonderful care from all the staff up on 6 [address].  Lattie Haw, MD  Proverbs 3:5-6

## 2021-11-15 NOTE — Care Management Important Message (Signed)
Important Message  Patient Details IM Letter given to the Patient. Name: Cheryl Singh MRN: [id number] Date of Birth: [birth year]   Medicare Important Message Given:  Yes     Jett, Kulzer 11/15/2021, 10:03 AM

## 2021-11-15 NOTE — Plan of Care (Signed)

## 2021-11-15 NOTE — Progress Notes (Signed)
PROGRESS NOTE    Cheryl Singh  VEH:[id number] DOB: February 23, 1951 DOA: 11/12/2021 PCP: Glenis Smoker, MD   Brief Narrative: 71 year old female with history of hyper lipidemia hypertension anemia and metastatic bladder cancer admitted with abdominal pain CT scan of the abdomen shows diffuse thickening in the sigmoid extending towards the rectum consistent with acute colitis.  No perforation or abscess.  She also had hepatic and skeletal metastatic disease and bladder wall thickening consistent with her history of bladder cancer.  Recent PET scan done as outpatient on 11/09/2021 showed acute colitis and diverticulitis of the sigmoid and upper rectum.  Extensive bony metastasis with mixed response were seen. She lives at home with her caregiver. 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 intake.  CT of the abdomen shows acute colitis of the sigmoid and the rectal area. Advance diet to full liquids today and advance as tolerated. IV Zosyn. Still with leukocytosis though improved  #2 symptomatic iron deficiency anemia patient received 1 unit of packed RBC.  #3 hypokalemia/hypomagnesemia replete and recheck labs in AM.  #4 metastatic bladder cancer appreciate oncology input  #5 history of essential hypertension pressure stable 132/84  #6 hyperlipidemia on Lipitor       Nutrition Problem: Moderate Malnutrition Etiology: chronic illness, cancer and cancer related treatments     Signs/Symptoms: mild fat depletion, mild muscle depletion, percent weight loss    Interventions: Boost Breeze, Ensure Enlive  (each supplement provides 350kcal and 20 grams of protein), MVI  Estimated body mass index is 25.63 kg/m as calculated from the following:   Height as of this encounter: 5' 4.5" (1.638 m).   Weight as of this encounter: 68.8 kg.  DVT prophylaxis: scd Code Status: full Family Communication:none Disposition Plan:  Status is: Inpatient Remains inpatient appropriate because: Admitted with colitis on IV antibiotics and clear liquids   Consultants:  Oncology  Procedures: None Antimicrobials: Zosyn  Subjective:  She reports her pain is better did have some nausea but no vomiting diarrhea better still on clear liquids would like to have some food today Objective: Vitals:   11/14/21 1454 11/14/21 2057 11/15/21 0558 11/15/21 1214  BP: 134/83 136/86 (!) 146/84 (!) 143/81  Pulse: 72 80 91 94  Resp: '18 17 18 18  '$ Temp: (!) 97.5 F (36.4 C) (!) 97.5 F (36.4 C) (!) 97.4 F (36.3 C) 97.8 F (36.6 C)  TempSrc: Oral Oral Oral Oral  SpO2: 95% 99% 96% 98%  Weight:      Height:        Intake/Output Summary (Last 24 hours) at 11/15/2021 1510 Last data filed at 11/15/2021 1200 Gross per 24 hour  Intake 2493.82 ml  Output --  Net 2493.82 ml    Filed Weights   11/13/21 1438  Weight: 68.8 kg    Examination:  General exam: Appears in nad Respiratory system: Clear to auscultation. Respiratory effort normal. Cardiovascular system: S1 & S2 heard, RRR. No JVD, murmurs, rubs, gallops or clicks. No pedal edema. Gastrointestinal system: Abdomen is nondistended, soft and tender to touch.  No organomegaly or masses felt. Normal bowel sounds heard. Central nervous system:  Alert and oriented. No focal neurological deficits. Extremities: Symmetric 5 x 5 power. Skin: No rashes, lesions or ulcers Psychiatry: Judgement and insight appear normal. Mood & affect appropriate.     Data Reviewed: I have personally reviewed following labs and imaging studies  CBC: Recent Labs  Lab 11/12/21 1818  11/13/21 0317 11/14/21 0456 11/15/21 0556  WBC 24.6* 22.4* 17.7* 15.9*  NEUTROABS 18.1*  --   --  9.1*  HGB 7.8* 8.3* 7.7* 8.1*  HCT 23.2* 24.3* 23.2* 24.6*  MCV 91.3 90.3 90.6 92.5  PLT 324 242 301 921    Basic Metabolic Panel: Recent Labs  Lab 11/12/21 1818 11/12/21 2236 11/13/21 0317 11/14/21 0456 11/15/21 0556  NA 133*  --  130* 132* 135  K 3.0*  --  4.0 3.2* 3.5  CL 96*  --  95* 98 101  CO2 25  --  '26 26 27  '$ GLUCOSE 106*  --  92 90 81  BUN 21  --  19 10 6*  CREATININE 0.98  --  0.93 0.71 0.74  CALCIUM 8.0*  --  7.6* 7.7* 7.8*  MG  --  1.2* 1.9 1.3* 1.9  PHOS  --  3.4  --   --   --     GFR: Estimated Creatinine Clearance: 64 mL/min (by C-G formula based on SCr of 0.74 mg/dL). Liver Function Tests: Recent Labs  Lab 11/12/21 1818 11/12/21 2236 11/13/21 0317 11/15/21 0556  AST '26 22 18 17  '$ ALT '27 24 22 18  '$ ALKPHOS 142* 130* 124 122  BILITOT 0.4 0.5 0.6 0.4  PROT 5.7* 5.3* 5.2* 4.9*  ALBUMIN 2.2* 2.0* 2.0* 1.8*    Recent Labs  Lab 11/12/21 1818  LIPASE 21    No results for input(s): AMMONIA in the last 168 hours. Coagulation Profile: No results for input(s): INR, PROTIME in the last 168 hours. Cardiac Enzymes: Recent Labs  Lab 11/12/21 2236  CKTOTAL 39    BNP (last 3 results) No results for input(s): PROBNP in the last 8760 hours. HbA1C: No results for input(s): HGBA1C in the last 72 hours. CBG: Recent Labs  Lab 11/09/21 1013  GLUCAP 109*    Lipid Profile: No results for input(s): CHOL, HDL, LDLCALC, TRIG, CHOLHDL, LDLDIRECT in the last 72 hours. Thyroid Function Tests: Recent Labs    11/12/21 2236  TSH 2.038    Anemia Panel: Recent Labs    11/12/21 2236  VITAMINB12 2,084*  FOLATE 14.5  FERRITIN 2,428*  TIBC 95*  IRON 24*  RETICCTPCT 2.2    Sepsis Labs: Recent Labs  Lab 11/12/21 2236  LATICACIDVEN 1.5     Recent Results (from the past 240 hour(s))  Urine Culture     Status: Abnormal (Preliminary result)    Collection Time: 11/12/21  6:32 PM   Specimen: Urine, Clean Catch  Result Value Ref Range Status   Specimen Description   Final    URINE, CLEAN CATCH Performed at [facility], [place] [address], [place], [place] [address]    Special Requests   Final    NONE Performed at [facility], [place] [address] [place], [place] [address]    Culture >=100,000 COLONIES/mL KLEBSIELLA PNEUMONIAE (A)  Final   Report Status PENDING  Incomplete  Culture, blood (routine x 2)     Status: None (Preliminary result)   Collection Time: 11/12/21  6:40 PM   Specimen: BLOOD  Result Value Ref Range Status   Specimen Description   Final  BLOOD PORTA CATH Performed at [facility], [place] [address] [place], [place] [address]    Special Requests   Final    BOTTLES DRAWN AEROBIC AND ANAEROBIC Blood Culture adequate volume Performed at [facility] [address], [place], [place] [address]    Culture   Final    NO GROWTH 3 DAYS Performed at [facility], [place] [address] [place], [place] [address]    Report Status PENDING  Incomplete  Culture, blood (routine x 2)     Status: None (Preliminary result)   Collection Time: 11/13/21 12:10 AM   Specimen: BLOOD  Result Value Ref Range Status   Specimen Description   Final    BLOOD RIGHT ANTECUBITAL Performed at [place] [address] [place], [place] [address]    Special Requests   Final    BOTTLES DRAWN AEROBIC AND ANAEROBIC Blood Culture adequate volume Performed at [place] [address] [place], [place] [address]    Culture   Final    NO GROWTH 2 DAYS Performed at [place] [address] [place],  [address]    Report Status PENDING  Incomplete          Radiology Studies: No results found.      Scheduled Meds:  sodium chloride   Intravenous Once   atorvastatin  10 mg Oral QPM   diltiazem   60 mg Oral TID   famotidine  40 mg Oral BID   feeding supplement  237 mL Oral BID BM   multivitamin with minerals  1 tablet Oral Daily   Continuous Infusions:  sodium chloride 75 mL/hr at 11/14/21 1504   lactated ringers 75 mL/hr at 11/14/21 1442   piperacillin-tazobactam (ZOSYN)  IV 3.375 g (11/15/21 1239)     LOS: 3 days    Georgette Shell, MD 11/15/2021, 3:10 PM

## 2021-11-16 DIAGNOSIS — K529 Noninfective gastroenteritis and colitis, unspecified: Secondary | ICD-10-CM | POA: Diagnosis not present

## 2021-11-16 LAB — CBC WITH DIFFERENTIAL/PLATELET
Abs Immature Granulocytes: 1.97 10*3/uL — ABNORMAL HIGH (ref 0.00–0.07)
Basophils Absolute: 0.1 10*3/uL (ref 0.0–0.1)
Basophils Relative: 0 %
Eosinophils Absolute: 0.1 10*3/uL (ref 0.0–0.5)
Eosinophils Relative: 0 %
HCT: 26.1 % — ABNORMAL LOW (ref 36.0–46.0)
Hemoglobin: 8.3 g/dL — ABNORMAL LOW (ref 12.0–15.0)
Immature Granulocytes: 12 %
Lymphocytes Relative: 3 %
Lymphs Abs: 0.6 10*3/uL — ABNORMAL LOW (ref 0.7–4.0)
MCH: 30.1 pg (ref 26.0–34.0)
MCHC: 31.8 g/dL (ref 30.0–36.0)
MCV: 94.6 fL (ref 80.0–100.0)
Monocytes Absolute: 2.5 10*3/uL — ABNORMAL HIGH (ref 0.1–1.0)
Monocytes Relative: 15 %
Neutro Abs: 11.6 10*3/uL — ABNORMAL HIGH (ref 1.7–7.7)
Neutrophils Relative %: 70 %
Platelets: 425 10*3/uL — ABNORMAL HIGH (ref 150–400)
RBC: 2.76 MIL/uL — ABNORMAL LOW (ref 3.87–5.11)
RDW: 17.2 % — ABNORMAL HIGH (ref 11.5–15.5)
WBC: 16.8 10*3/uL — ABNORMAL HIGH (ref 4.0–10.5)
nRBC: 0.3 % — ABNORMAL HIGH (ref 0.0–0.2)

## 2021-11-16 LAB — COMPREHENSIVE METABOLIC PANEL
ALT: 16 U/L (ref 0–44)
AST: 16 U/L (ref 15–41)
Albumin: 1.9 g/dL — ABNORMAL LOW (ref 3.5–5.0)
Alkaline Phosphatase: 126 U/L (ref 38–126)
Anion gap: 9 (ref 5–15)
BUN: 7 mg/dL — ABNORMAL LOW (ref 8–23)
CO2: 28 mmol/L (ref 22–32)
Calcium: 7.8 mg/dL — ABNORMAL LOW (ref 8.9–10.3)
Chloride: 100 mmol/L (ref 98–111)
Creatinine, Ser: 0.89 mg/dL (ref 0.44–1.00)
GFR, Estimated: >60 mL/min (ref >60)
Glucose, Bld: 146 mg/dL — ABNORMAL HIGH (ref 70–99)
Potassium: 3.4 mmol/L — ABNORMAL LOW (ref 3.5–5.1)
Sodium: 137 mmol/L (ref 135–145)
Total Bilirubin: 0.6 mg/dL (ref 0.3–1.2)
Total Protein: 5 g/dL — ABNORMAL LOW (ref 6.5–8.1)

## 2021-11-16 LAB — URINE CULTURE: Culture: >=100000 — AB

## 2021-11-16 MED ORDER — CHLORHEXIDINE GLUCONATE CLOTH 2 % EX PADS
6.0000 | MEDICATED_PAD | Freq: Every day | CUTANEOUS | Status: DC
  Administered 2021-11-16 – 2021-11-22 (×7): 6 via TOPICAL

## 2021-11-16 NOTE — Progress Notes (Addendum)
PROGRESS NOTE    Cheryl Singh  TDV:[id number] DOB: Feb 03, 1951 DOA: 11/12/2021 PCP: Glenis Smoker, MD   Brief Narrative: 71 year old female with history of hyper lipidemia hypertension anemia and metastatic bladder cancer admitted with abdominal pain CT scan of the abdomen shows diffuse thickening in the sigmoid extending towards the rectum consistent with acute colitis.  No perforation or abscess.  She also had hepatic and skeletal metastatic disease and bladder wall thickening consistent with her history of bladder cancer.  Recent PET scan done as outpatient on 11/09/2021 showed acute colitis and diverticulitis of the sigmoid and upper rectum.  Extensive bony metastasis with mixed response were seen. She lives at home with her caregiver. 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 intake.  CT of the abdomen shows acute colitis of the sigmoid and the rectal area. Full liquid diet IV Zosyn. Still with leukocytosis though improved  #2 symptomatic iron deficiency anemia patient received 1 unit of packed RBC.  #3 hypokalemia/hypomagnesemia replete and recheck labs in AM.  #4 metastatic bladder cancer appreciate oncology input  #5 history of essential hypertension pressure stable 132/84  #6 hyperlipidemia on Lipitor  #7 Klebsiella UTI sensitive to Zosyn continue.   Nutrition Problem: Moderate Malnutrition Etiology: chronic illness, cancer and cancer related treatments     Signs/Symptoms: mild fat depletion, mild muscle depletion, percent weight  loss    Interventions: Boost Breeze, Ensure Enlive (each supplement provides 350kcal and 20 grams of protein), MVI  Estimated body mass index is 25.63 kg/m as calculated from the following:   Height as of this encounter: 5' 4.5" (1.638 m).   Weight as of this encounter: 68.8 kg.  DVT prophylaxis: scd Code Status: full Family Communication:none Disposition Plan:  Status is: Inpatient Remains inpatient appropriate because: Admitted with colitis on IV antibiotics and clear liquids   Consultants:  Oncology  Procedures: None Antimicrobials: Zosyn  Subjective:  Feels abdominal pain is better no diarrhea Ready to try full liquid diet to soft diet today Objective: Vitals:   11/15/21 1214 11/15/21 2123 11/16/21 0525 11/16/21 1342  BP: (!) 143/81 131/72 125/73 (!) 148/92  Pulse: 94 86 87 99  Resp: '18 16 14 16  '$ Temp: 97.8 F (36.6 C) 97.9 F (36.6 C) 98 F (36.7 C) 97.6 F (36.4 C)  TempSrc: Oral Oral Oral Oral  SpO2: 98% 96% 93% 97%  Weight:      Height:        Intake/Output Summary (Last 24 hours) at 11/16/2021 1516 Last data filed at 11/16/2021 1000 Gross per 24 hour  Intake 748.69 ml  Output --  Net 748.69 ml    Filed Weights   11/13/21 1438  Weight: 68.8 kg    Examination:  General exam: Appears in nad Respiratory system: Clear to auscultation. Respiratory effort normal. Cardiovascular system: S1 & S2 heard, RRR. No JVD, murmurs, rubs, gallops or clicks. No pedal edema. Gastrointestinal system: Abdomen is nondistended, soft and tender to touch.  No organomegaly or masses felt. Normal bowel sounds heard. Central nervous system: Alert and oriented. No focal neurological deficits. Extremities: Symmetric  5 x 5 power. Skin: No rashes, lesions or ulcers Psychiatry: Judgement and insight appear normal. Mood & affect appropriate.     Data Reviewed: I have personally reviewed following labs and imaging studies  CBC: Recent Labs  Lab 11/12/21 1818  11/13/21 0317 11/14/21 0456 11/15/21 0556 11/16/21 1020  WBC 24.6* 22.4* 17.7* 15.9* 16.8*  NEUTROABS 18.1*  --   --  9.1* 11.6*  HGB 7.8* 8.3* 7.7* 8.1* 8.3*  HCT 23.2* 24.3* 23.2* 24.6* 26.1*  MCV 91.3 90.3 90.6 92.5 94.6  PLT 324 242 301 344 425*    Basic Metabolic Panel: Recent Labs  Lab 11/12/21 1818 11/12/21 2236 11/13/21 0317 11/14/21 0456 11/15/21 0556 11/16/21 1020  NA 133*  --  130* 132* 135 137  K 3.0*  --  4.0 3.2* 3.5 3.4*  CL 96*  --  95* 98 101 100  CO2 25  --  '26 26 27 28  '$ GLUCOSE 106*  --  92 90 81 146*  BUN 21  --  19 10 6* 7*  CREATININE 0.98  --  0.93 0.71 0.74 0.89  CALCIUM 8.0*  --  7.6* 7.7* 7.8* 7.8*  MG  --  1.2* 1.9 1.3* 1.9  --   PHOS  --  3.4  --   --   --   --     GFR: Estimated Creatinine Clearance: 57.5 mL/min (by C-G formula based on SCr of 0.89 mg/dL). Liver Function Tests: Recent Labs  Lab 11/12/21 1818 11/12/21 2236 11/13/21 0317 11/15/21 0556 11/16/21 1020  AST '26 22 18 17 16  '$ ALT '27 24 22 18 16  '$ ALKPHOS 142* 130* 124 122 126  BILITOT 0.4 0.5 0.6 0.4 0.6  PROT 5.7* 5.3* 5.2* 4.9* 5.0*  ALBUMIN 2.2* 2.0* 2.0* 1.8* 1.9*    Recent Labs  Lab 11/12/21 1818  LIPASE 21    No results for input(s): AMMONIA in the last 168 hours. Coagulation Profile: No results for input(s): INR, PROTIME in the last 168 hours. Cardiac Enzymes: Recent Labs  Lab 11/12/21 2236  CKTOTAL 39    BNP (last 3 results) No results for input(s): PROBNP in the last 8760 hours. HbA1C: No results for input(s): HGBA1C in the last 72 hours. CBG: No results for input(s): GLUCAP in the last 168 hours.  Lipid Profile: No results for input(s): CHOL, HDL, LDLCALC, TRIG, CHOLHDL, LDLDIRECT in the last 72 hours. Thyroid Function Tests: No results for input(s): TSH, T4TOTAL, FREET4, T3FREE, THYROIDAB in the last 72 hours.  Anemia Panel: No results for input(s): VITAMINB12, FOLATE, FERRITIN, TIBC, IRON, RETICCTPCT in the last 72 hours.  Sepsis  Labs: Recent Labs  Lab 11/12/21 2236  LATICACIDVEN 1.5     Recent Results (from the past 240 hour(s))  Urine Culture     Status: Abnormal   Collection Time: 11/12/21  6:32 PM   Specimen: Urine, Clean Catch  Result Value Ref Range Status   Specimen Description   Final    URINE, CLEAN CATCH Performed at [facility], [place] [address] [place], [place] [address]    Special Requests   Final    NONE Performed at [facility], [place] [address] [place], [place] [address]    Culture >=100,000 COLONIES/mL KLEBSIELLA PNEUMONIAE (A)  Final   Report Status 11/16/2021 FINAL  Final   Organism ID, Bacteria KLEBSIELLA PNEUMONIAE (A)  Final      Susceptibility   Klebsiella pneumoniae - MIC*    AMPICILLIN >=32 RESISTANT Resistant  CEFAZOLIN <=4 SENSITIVE Sensitive     CEFEPIME <=0.12 SENSITIVE Sensitive     CEFTRIAXONE <=0.25 SENSITIVE Sensitive     CIPROFLOXACIN <=0.25 SENSITIVE Sensitive     GENTAMICIN <=1 SENSITIVE Sensitive     IMIPENEM <=0.25 SENSITIVE Sensitive     NITROFURANTOIN 64 INTERMEDIATE Intermediate     TRIMETH/SULFA <=20 SENSITIVE Sensitive     AMPICILLIN/SULBACTAM 8 SENSITIVE Sensitive     PIP/TAZO <=4 SENSITIVE Sensitive     * >=100,000 COLONIES/mL KLEBSIELLA PNEUMONIAE  Culture, blood (routine x 2)     Status: None (Preliminary result)   Collection Time: 11/12/21  6:40 PM   Specimen: BLOOD  Result Value Ref Range Status   Specimen Description   Final    BLOOD PORTA CATH Performed at [place] [address], [place], [place] [address]    Special Requests   Final    BOTTLES DRAWN AEROBIC AND ANAEROBIC Blood Culture adequate volume Performed at [facility] [address], [place], [place] [address]    Culture   Final    NO GROWTH 4 DAYS Performed at [facility], [place] [address] [place], [place] [address]    Report Status PENDING  Incomplete  Culture, blood (routine x 2)      Status: None (Preliminary result)   Collection Time: 11/13/21 12:10 AM   Specimen: BLOOD  Result Value Ref Range Status   Specimen Description   Final    BLOOD RIGHT ANTECUBITAL Performed at [facility] [address], [place], [place] [address]    Special Requests   Final    BOTTLES DRAWN AEROBIC AND ANAEROBIC Blood Culture adequate volume Performed at [facility] [address] [place], [place] [address]    Culture   Final    NO GROWTH 3 DAYS Performed at [facility], [place] [address] [place], [place] [address]    Report Status PENDING  Incomplete          Radiology Studies: No results found.      Scheduled Meds:  sodium chloride   Intravenous Once   atorvastatin  10 mg Oral QPM   Chlorhexidine Gluconate Cloth  6 each Topical Daily   diltiazem  60 mg Oral TID   famotidine  40 mg Oral BID   feeding supplement  237 mL Oral BID BM   multivitamin with minerals  1 tablet Oral Daily   Continuous Infusions:  sodium chloride 75 mL/hr at 11/14/21 1504   lactated ringers 75 mL/hr at 11/14/21 1442   piperacillin-tazobactam (ZOSYN)  IV 3.375 g (11/16/21 1200)     LOS: 4 days    Georgette Shell, MD 11/16/2021, 3:16 PM

## 2021-11-16 NOTE — Plan of Care (Signed)
Uneventful night, endorses mild abdominal discomfort without N/V. Tolerating advanced diet without residual pain or overt GI sx. Independent in room.  Problem: Education: Goal: Knowledge of General Education information will improve Description: Including pain rating scale, medication(s)/side effects and non-pharmacologic comfort measures Outcome: Progressing   Problem: Health Behavior/Discharge Planning: Goal: Ability to manage health-related needs will improve Outcome: Progressing   Problem: Clinical Measurements: Goal: Ability to maintain clinical measurements within normal limits will improve Outcome: Progressing Goal: Will remain free from infection Outcome: Progressing Goal: Diagnostic test results will improve Outcome: Progressing Goal: Respiratory complications will improve Outcome: Progressing Goal: Cardiovascular complication will be avoided Outcome: Progressing   Problem: Activity: Goal: Risk for activity intolerance will decrease Outcome: Progressing   Problem: Nutrition: Goal: Adequate nutrition will be maintained Outcome: Progressing   Problem: Coping: Goal: Level of anxiety will decrease Outcome: Progressing   Problem: Elimination: Goal: Will not experience complications related to bowel motility Outcome: Progressing Goal: Will not experience complications related to urinary retention Outcome: Progressing   Problem: Pain Managment: Goal: General experience of comfort will improve Outcome: Progressing   Problem: Safety: Goal: Ability to remain free from injury will improve Outcome: Progressing   Problem: Skin Integrity: Goal: Risk for impaired skin integrity will decrease Outcome: Progressing

## 2021-11-16 NOTE — Progress Notes (Signed)
She is getting better.  She is not having as much abdominal pain.  She is a little bit worried about advance her diet so quickly.  She would like to have a soft diet.  She does have Klebsiella in her urine.  We have to see what the sensitivities are.  She is going to the bathroom.  She is ambulating.  Labs have not been drawn yet.  Her vital signs show a temperature of 98.  Pulse 87.  Blood pressure 125/73.  Her abdomen is soft.  Bowel sounds are more active.  She may have little bit of distention.  There is no fluid wave.  There is no palpable liver or spleen tip.  Extremity shows no clubbing, cyanosis or edema.  Cardiac exam is irregular.  I looked at her cardiac monitor.  Looks like she has PACs.  Again, she is getting better.  I told her that this colitis can take 2 or 3 weeks before it really starts improving.  Again she is making nice improvement.  The pain is much less.  It will be interesting to see what the Klebsiella is sensitive to.  I do appreciate the incredible care that she is getting from all the staff up on [address].    Lattie [place], MD  Proverbs 3:5-6

## 2021-11-17 DIAGNOSIS — K529 Noninfective gastroenteritis and colitis, unspecified: Secondary | ICD-10-CM | POA: Diagnosis not present

## 2021-11-17 LAB — COMPREHENSIVE METABOLIC PANEL
ALT: 15 U/L (ref 0–44)
AST: 15 U/L (ref 15–41)
Albumin: 1.8 g/dL — ABNORMAL LOW (ref 3.5–5.0)
Alkaline Phosphatase: 124 U/L (ref 38–126)
Anion gap: 7 (ref 5–15)
BUN: 6 mg/dL — ABNORMAL LOW (ref 8–23)
CO2: 29 mmol/L (ref 22–32)
Calcium: 7.7 mg/dL — ABNORMAL LOW (ref 8.9–10.3)
Chloride: 101 mmol/L (ref 98–111)
Creatinine, Ser: 0.79 mg/dL (ref 0.44–1.00)
GFR, Estimated: >60 mL/min (ref >60)
Glucose, Bld: 85 mg/dL (ref 70–99)
Potassium: 3 mmol/L — ABNORMAL LOW (ref 3.5–5.1)
Sodium: 137 mmol/L (ref 135–145)
Total Bilirubin: 0.3 mg/dL (ref 0.3–1.2)
Total Protein: 5 g/dL — ABNORMAL LOW (ref 6.5–8.1)

## 2021-11-17 LAB — CBC WITH DIFFERENTIAL/PLATELET
Abs Immature Granulocytes: 1.54 10*3/uL — ABNORMAL HIGH (ref 0.00–0.07)
Basophils Absolute: 0.1 10*3/uL (ref 0.0–0.1)
Basophils Relative: 1 %
Eosinophils Absolute: 0.1 10*3/uL (ref 0.0–0.5)
Eosinophils Relative: 1 %
HCT: 24.9 % — ABNORMAL LOW (ref 36.0–46.0)
Hemoglobin: 7.9 g/dL — ABNORMAL LOW (ref 12.0–15.0)
Immature Granulocytes: 12 %
Lymphocytes Relative: 5 %
Lymphs Abs: 0.7 10*3/uL (ref 0.7–4.0)
MCH: 29.9 pg (ref 26.0–34.0)
MCHC: 31.7 g/dL (ref 30.0–36.0)
MCV: 94.3 fL (ref 80.0–100.0)
Monocytes Absolute: 2.3 10*3/uL — ABNORMAL HIGH (ref 0.1–1.0)
Monocytes Relative: 17 %
Neutro Abs: 8.7 10*3/uL — ABNORMAL HIGH (ref 1.7–7.7)
Neutrophils Relative %: 64 %
Platelets: 381 10*3/uL (ref 150–400)
RBC: 2.64 MIL/uL — ABNORMAL LOW (ref 3.87–5.11)
RDW: 16.9 % — ABNORMAL HIGH (ref 11.5–15.5)
WBC: 13.3 10*3/uL — ABNORMAL HIGH (ref 4.0–10.5)
nRBC: 0.2 % (ref 0.0–0.2)

## 2021-11-17 LAB — CULTURE, BLOOD (ROUTINE X 2)
Culture: NO GROWTH
Special Requests: ADEQUATE

## 2021-11-17 LAB — MAGNESIUM: Magnesium: 1.3 mg/dL — ABNORMAL LOW (ref 1.7–2.4)

## 2021-11-17 MED ORDER — POTASSIUM CHLORIDE 10 MEQ/100ML IV SOLN
10.0000 meq | INTRAVENOUS | Status: AC
  Administered 2021-11-17 (×4): 10 meq via INTRAVENOUS
  Filled 2021-11-17 (×4): qty 100

## 2021-11-17 MED ORDER — POTASSIUM CHLORIDE CRYS ER 20 MEQ PO TBCR
40.0000 meq | EXTENDED_RELEASE_TABLET | ORAL | Status: AC
  Administered 2021-11-17 (×2): 40 meq via ORAL
  Filled 2021-11-17 (×2): qty 2

## 2021-11-17 MED ORDER — POTASSIUM CHLORIDE CRYS ER 20 MEQ PO TBCR
20.0000 meq | EXTENDED_RELEASE_TABLET | Freq: Every day | ORAL | Status: DC
  Administered 2021-11-18 – 2021-11-23 (×6): 20 meq via ORAL
  Filled 2021-11-17 (×6): qty 1

## 2021-11-17 MED ORDER — SODIUM CHLORIDE 0.9 % IV SOLN
2.0000 g | INTRAVENOUS | Status: DC
  Administered 2021-11-17 – 2021-11-20 (×4): 2 g via INTRAVENOUS
  Filled 2021-11-17 (×4): qty 20

## 2021-11-17 MED ORDER — MAGNESIUM SULFATE 4 GM/100ML IV SOLN
4.0000 g | Freq: Once | INTRAVENOUS | Status: AC
  Administered 2021-11-17: 4 g via INTRAVENOUS
  Filled 2021-11-17: qty 100

## 2021-11-17 MED ORDER — METRONIDAZOLE 500 MG/100ML IV SOLN
500.0000 mg | Freq: Two times a day (BID) | INTRAVENOUS | Status: DC
  Administered 2021-11-17 – 2021-11-20 (×8): 500 mg via INTRAVENOUS
  Filled 2021-11-17 (×8): qty 100

## 2021-11-17 NOTE — Plan of Care (Signed)

## 2021-11-17 NOTE — Progress Notes (Signed)
PROGRESS NOTE    Cheryl Singh  PFX:[id number] DOB: [birth year] DOA: 11/12/2021 PCP: Glenis Smoker, MD   Brief Narrative: 70 year old female with history of hyper lipidemia hypertension anemia and metastatic bladder cancer admitted with abdominal pain CT scan of the abdomen shows diffuse thickening in the sigmoid extending towards the rectum consistent with acute colitis.  No perforation or abscess.  She also had hepatic and skeletal metastatic disease and bladder wall thickening consistent with her history of bladder cancer.  Recent PET scan done as outpatient on 11/09/2021 showed acute colitis and diverticulitis of the sigmoid and upper rectum.  Extensive bony metastasis with mixed response were seen. She lives at home with her caregiver. 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 intake.  CT of the abdomen shows acute colitis of the sigmoid and the rectal area. Full liquid diet Rocephin and Flagyl starting 11/17/2021 Still with leukocytosis though improved WBC 13.3 down from 22.4  #2 symptomatic iron deficiency anemia patient received 1 unit of packed RBC. Hemoglobin 7.9 will monitor closely  #3 hypokalemia/hypomagnesemia replete and recheck labs in AM.  #4 metastatic bladder cancer appreciate oncology input  #5 history of essential hypertension pressure stable 132/84  #6 hyperlipidemia on Lipitor  #7 Klebsiella UTI sensitive to Rocephin  Nutrition Problem: Moderate Malnutrition Etiology: chronic illness, cancer and cancer related treatments     Signs/Symptoms:  mild fat depletion, mild muscle depletion, percent weight loss    Interventions: Boost Breeze, Ensure Enlive (each supplement provides 350kcal and 20 grams of protein), MVI  Estimated body mass index is 25.63 kg/m as calculated from the following:   Height as of this encounter: 5' 4.5" (1.638 m).   Weight as of this encounter: 68.8 kg.  DVT prophylaxis: scd Code Status: full Family Communication:none Disposition Plan:  Status is: Inpatient Remains inpatient appropriate because: Admitted with colitis on IV antibiotics and clear liquids   Consultants:  Oncology  Procedures: None Antimicrobials: Zosyn  Subjective:  Still with abdominal pain on full liquid diet No diarrhea Objective: Vitals:   11/16/21 1552 11/16/21 1958 11/17/21 0525 11/17/21 0924  BP: (!) 143/88 124/63 (!) 156/90 (!) 152/102  Pulse:  98 90   Resp:  16 17   Temp:  98 F (36.7 C) (!) 97.5 F (36.4 C)   TempSrc:  Oral Oral   SpO2:  95% 100%   Weight:      Height:        Intake/Output Summary (Last 24 hours) at 11/17/2021 1300 Last data filed at 11/17/2021 1214 Gross per 24 hour  Intake 534.51 ml  Output --  Net 534.51 ml    Filed Weights   11/13/21 1438  Weight: 68.8 kg    Examination:  General exam: Appears in nad Respiratory system: Clear to auscultation. Respiratory effort normal. Cardiovascular system: S1 & S2 heard, RRR. No JVD, murmurs, rubs, gallops or clicks. No pedal edema. Gastrointestinal system: Abdomen is nondistended, soft and tender to touch.  No organomegaly or masses felt. Normal bowel sounds heard. Central nervous system: Alert and oriented. No focal neurological deficits. Extremities: Symmetric  5 x 5 power. Skin: No rashes, lesions or ulcers Psychiatry: Judgement and insight appear normal. Mood & affect appropriate.     Data Reviewed: I have personally reviewed following labs and imaging studies  CBC: Recent Labs  Lab 11/12/21 1818 11/13/21 0317 11/14/21 0456  11/15/21 0556 11/16/21 1020 11/17/21 0331  WBC 24.6* 22.4* 17.7* 15.9* 16.8* 13.3*  NEUTROABS 18.1*  --   --  9.1* 11.6* 8.7*  HGB 7.8* 8.3* 7.7* 8.1* 8.3* 7.9*  HCT 23.2* 24.3* 23.2* 24.6* 26.1* 24.9*  MCV 91.3 90.3 90.6 92.5 94.6 94.3  PLT 324 242 301 344 425* 284    Basic Metabolic Panel: Recent Labs  Lab 11/12/21 2236 11/13/21 0317 11/14/21 0456 11/15/21 0556 11/16/21 1020 11/17/21 0331  NA  --  130* 132* 135 137 137  K  --  4.0 3.2* 3.5 3.4* 3.0*  CL  --  95* 98 101 100 101  CO2  --  '26 26 27 28 29  '$ GLUCOSE  --  92 90 81 146* 85  BUN  --  19 10 6* 7* 6*  CREATININE  --  0.93 0.71 0.74 0.89 0.79  CALCIUM  --  7.6* 7.7* 7.8* 7.8* 7.7*  MG 1.2* 1.9 1.3* 1.9  --  1.3*  PHOS 3.4  --   --   --   --   --     GFR: Estimated Creatinine Clearance: 64 mL/min (by C-G formula based on SCr of 0.79 mg/dL). Liver Function Tests: Recent Labs  Lab 11/12/21 2236 11/13/21 0317 11/15/21 0556 11/16/21 1020 11/17/21 0331  AST '22 18 17 16 15  '$ ALT '24 22 18 16 15  '$ ALKPHOS 130* 124 122 126 124  BILITOT 0.5 0.6 0.4 0.6 0.3  PROT 5.3* 5.2* 4.9* 5.0* 5.0*  ALBUMIN 2.0* 2.0* 1.8* 1.9* 1.8*    Recent Labs  Lab 11/12/21 1818  LIPASE 21    No results for input(s): AMMONIA in the last 168 hours. Coagulation Profile: No results for input(s): INR, PROTIME in the last 168 hours. Cardiac Enzymes: Recent Labs  Lab 11/12/21 2236  CKTOTAL 39    BNP (last 3 results) No results for input(s): PROBNP in the last 8760 hours. HbA1C: No results for input(s): HGBA1C in the last 72 hours. CBG: No results for input(s): GLUCAP in the last 168 hours.  Lipid Profile: No results for input(s): CHOL, HDL, LDLCALC, TRIG, CHOLHDL, LDLDIRECT in the last 72 hours. Thyroid Function Tests: No results for input(s): TSH, T4TOTAL, FREET4, T3FREE, THYROIDAB in the last 72 hours.  Anemia Panel: No results for input(s): VITAMINB12, FOLATE, FERRITIN, TIBC, IRON, RETICCTPCT in the last 72  hours.  Sepsis Labs: Recent Labs  Lab 11/12/21 2236  LATICACIDVEN 1.5     Recent Results (from the past 240 hour(s))  Urine Culture     Status: Abnormal   Collection Time: 11/12/21  6:32 PM   Specimen: Urine, Clean Catch  Result Value Ref Range Status   Specimen Description   Final    URINE, CLEAN CATCH Performed at [facility], [place] [address], [place], [place] [address]    Special Requests   Final    NONE Performed at [facility], [place] [address] [place], [place] [address]    Culture >=100,000 COLONIES/mL KLEBSIELLA PNEUMONIAE (A)  Final   Report Status 11/16/2021 FINAL  Final   Organism ID, Bacteria KLEBSIELLA PNEUMONIAE (A)  Final      Susceptibility   Klebsiella pneumoniae - MIC*  AMPICILLIN >=32 RESISTANT Resistant     CEFAZOLIN <=4 SENSITIVE Sensitive     CEFEPIME <=0.12 SENSITIVE Sensitive     CEFTRIAXONE <=0.25 SENSITIVE Sensitive     CIPROFLOXACIN <=0.25 SENSITIVE Sensitive     GENTAMICIN <=1 SENSITIVE Sensitive     IMIPENEM <=0.25 SENSITIVE Sensitive     NITROFURANTOIN 64 INTERMEDIATE Intermediate     TRIMETH/SULFA <=20 SENSITIVE Sensitive     AMPICILLIN/SULBACTAM 8 SENSITIVE Sensitive     PIP/TAZO <=4 SENSITIVE Sensitive     * >=100,000 COLONIES/mL KLEBSIELLA PNEUMONIAE  Culture, blood (routine x 2)     Status: None   Collection Time: 11/12/21  6:40 PM   Specimen: BLOOD  Result Value Ref Range Status   Specimen Description   Final    BLOOD PORTA CATH Performed at [facility] [address], [place], [place] [address]    Special Requests   Final    BOTTLES DRAWN AEROBIC AND ANAEROBIC Blood Culture adequate volume Performed at [facility] [address], [place], [place] [address]    Culture   Final    NO GROWTH 5 DAYS Performed at [facility], [place] [address] [place], [place] [address]    Report Status 11/17/2021 FINAL  Final  Culture, blood (routine x 2)      Status: None (Preliminary result)   Collection Time: 11/13/21 12:10 AM   Specimen: BLOOD  Result Value Ref Range Status   Specimen Description   Final    BLOOD RIGHT ANTECUBITAL Performed at [facility] [address], [place], [place] [address]    Special Requests   Final    BOTTLES DRAWN AEROBIC AND ANAEROBIC Blood Culture adequate volume Performed at [facility] [address], [place], [place] [address]    Culture   Final    NO GROWTH 4 DAYS Performed at [facility], [place] [address] [place], [place] [address]    Report Status PENDING  Incomplete          Radiology Studies: No results found.      Scheduled Meds:  sodium chloride   Intravenous Once   atorvastatin  10 mg Oral QPM   Chlorhexidine Gluconate Cloth  6 each Topical Daily   diltiazem  60 mg Oral TID   famotidine  40 mg Oral BID   feeding supplement  237 mL Oral BID BM   multivitamin with minerals  1 tablet Oral Daily   Continuous Infusions:  sodium chloride 75 mL/hr at 11/17/21 0921   lactated ringers Stopped (11/16/21 1555)   magnesium sulfate bolus IVPB     piperacillin-tazobactam (ZOSYN)  IV 3.375 g (11/17/21 0334)   potassium chloride 10 mEq (11/17/21 1201)     LOS: 5 days    Georgette Shell, MD 11/17/2021, 1:00 PM

## 2021-11-18 DIAGNOSIS — K529 Noninfective gastroenteritis and colitis, unspecified: Secondary | ICD-10-CM | POA: Diagnosis not present

## 2021-11-18 LAB — COMPREHENSIVE METABOLIC PANEL
ALT: 15 U/L (ref 0–44)
AST: 14 U/L — ABNORMAL LOW (ref 15–41)
Albumin: 1.9 g/dL — ABNORMAL LOW (ref 3.5–5.0)
Alkaline Phosphatase: 127 U/L — ABNORMAL HIGH (ref 38–126)
Anion gap: 7 (ref 5–15)
BUN: 7 mg/dL — ABNORMAL LOW (ref 8–23)
CO2: 25 mmol/L (ref 22–32)
Calcium: 8.2 mg/dL — ABNORMAL LOW (ref 8.9–10.3)
Chloride: 105 mmol/L (ref 98–111)
Creatinine, Ser: 0.67 mg/dL (ref 0.44–1.00)
GFR, Estimated: >60 mL/min (ref >60)
Glucose, Bld: 79 mg/dL (ref 70–99)
Potassium: 3.9 mmol/L (ref 3.5–5.1)
Sodium: 137 mmol/L (ref 135–145)
Total Bilirubin: 0.4 mg/dL (ref 0.3–1.2)
Total Protein: 5.1 g/dL — ABNORMAL LOW (ref 6.5–8.1)

## 2021-11-18 LAB — CBC WITH DIFFERENTIAL/PLATELET
Abs Immature Granulocytes: 1.11 10*3/uL — ABNORMAL HIGH (ref 0.00–0.07)
Basophils Absolute: 0 10*3/uL (ref 0.0–0.1)
Basophils Relative: 0 %
Eosinophils Absolute: 0.1 10*3/uL (ref 0.0–0.5)
Eosinophils Relative: 1 %
HCT: 25.4 % — ABNORMAL LOW (ref 36.0–46.0)
Hemoglobin: 7.9 g/dL — ABNORMAL LOW (ref 12.0–15.0)
Immature Granulocytes: 9 %
Lymphocytes Relative: 5 %
Lymphs Abs: 0.6 10*3/uL — ABNORMAL LOW (ref 0.7–4.0)
MCH: 29.8 pg (ref 26.0–34.0)
MCHC: 31.1 g/dL (ref 30.0–36.0)
MCV: 95.8 fL (ref 80.0–100.0)
Monocytes Absolute: 2.1 10*3/uL — ABNORMAL HIGH (ref 0.1–1.0)
Monocytes Relative: 18 %
Neutro Abs: 7.9 10*3/uL — ABNORMAL HIGH (ref 1.7–7.7)
Neutrophils Relative %: 67 %
Platelets: 413 10*3/uL — ABNORMAL HIGH (ref 150–400)
RBC: 2.65 MIL/uL — ABNORMAL LOW (ref 3.87–5.11)
RDW: 17.3 % — ABNORMAL HIGH (ref 11.5–15.5)
WBC: 11.9 10*3/uL — ABNORMAL HIGH (ref 4.0–10.5)
nRBC: 0.3 % — ABNORMAL HIGH (ref 0.0–0.2)

## 2021-11-18 LAB — MAGNESIUM: Magnesium: 1.8 mg/dL (ref 1.7–2.4)

## 2021-11-18 LAB — CULTURE, BLOOD (ROUTINE X 2)
Culture: NO GROWTH
Special Requests: ADEQUATE

## 2021-11-18 NOTE — Progress Notes (Signed)
PROGRESS NOTE    Cheryl Singh  XTK:[id number] DOB: Nov 09, 1950 DOA: 11/12/2021 PCP: Glenis Smoker, MD   Brief Narrative: 71 year old female with history of hyper lipidemia hypertension anemia and metastatic bladder cancer admitted with abdominal pain CT scan of the abdomen shows diffuse thickening in the sigmoid extending towards the rectum consistent with acute colitis.  No perforation or abscess.  She also had hepatic and skeletal metastatic disease and bladder wall thickening consistent with her history of bladder cancer.  Recent PET scan done as outpatient on 11/09/2021 showed acute colitis and diverticulitis of the sigmoid and upper rectum.  Extensive bony metastasis with mixed response were seen. She lives at home with her caregiver. 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 intake.  CT of the abdomen shows acute colitis of the sigmoid and the rectal area. Mechanical soft diet Rocephin and Flagyl starting 11/17/2021 Leukocytosis resolving  #2 symptomatic iron deficiency anemia patient received 1 unit of packed RBC. Hemoglobin 7.9 will monitor closely  #3 hypokalemia/hypomagnesemia replete and recheck labs in AM.  #4 metastatic bladder cancer appreciate oncology input  #5 history of essential hypertension pressure stable 132/84  #6 hyperlipidemia on Lipitor  #7 Klebsiella UTI sensitive to Rocephin  Nutrition Problem: Moderate Malnutrition Etiology: chronic illness, cancer and cancer related treatments     Signs/Symptoms: mild fat depletion, mild muscle  depletion, percent weight loss    Interventions: Boost Breeze, Ensure Enlive (each supplement provides 350kcal and 20 grams of protein), MVI  Estimated body mass index is 25.63 kg/m as calculated from the following:   Height as of this encounter: 5' 4.5" (1.638 m).   Weight as of this encounter: 68.8 kg.  DVT prophylaxis: scd Code Status: full Family Communication:none Disposition Plan:  Status is: Inpatient Remains inpatient appropriate because: Admitted with colitis on IV antibiotics and clear liquids   Consultants:  Oncology  Procedures: None Antimicrobials: Rocephin and Flagyl Subjective: Patient resting in bed Denies vomiting has occasional nausea and some diarrhea Trying to advance diet as tolerated  Objective: Vitals:   11/17/21 1630 11/17/21 1953 11/18/21 0436 11/18/21 1122  BP: 129/68 (!) 146/73 (!) 156/83 136/79  Pulse:  86 86 84  Resp:  20 18   Temp:  (!) 97.5 F (36.4 C) 97.6 F (36.4 C)   TempSrc:  Oral Oral   SpO2:  98% 97%   Weight:      Height:        Intake/Output Summary (Last 24 hours) at 11/18/2021 1225 Last data filed at 11/18/2021 0800 Gross per 24 hour  Intake 2196.07 ml  Output --  Net 2196.07 ml    Filed Weights   11/13/21 1438  Weight: 68.8 kg    Examination:  General exam: Appears in nad Respiratory system: Clear to auscultation. Respiratory effort normal. Cardiovascular system: S1 & S2 heard, RRR. No JVD, murmurs, rubs, gallops or clicks. No pedal edema. Gastrointestinal system: Abdomen is nondistended, soft and tender to touch.  No organomegaly or masses felt. Normal bowel sounds heard. Central nervous system: Alert and oriented. No focal neurological deficits. Extremities: Symmetric  5 x 5 power. Skin: No rashes, lesions or ulcers Psychiatry: Judgement and insight appear normal. Mood & affect appropriate.     Data Reviewed: I have personally reviewed following labs and imaging studies  CBC: Recent Labs  Lab  11/12/21 1818 11/13/21 0317 11/14/21 0456 11/15/21 0556 11/16/21 1020 11/17/21 0331 11/18/21 0433  WBC 24.6*   < > 17.7* 15.9* 16.8* 13.3* 11.9*  NEUTROABS 18.1*  --   --  9.1* 11.6* 8.7* 7.9*  HGB 7.8*   < > 7.7* 8.1* 8.3* 7.9* 7.9*  HCT 23.2*   < > 23.2* 24.6* 26.1* 24.9* 25.4*  MCV 91.3   < > 90.6 92.5 94.6 94.3 95.8  PLT 324   < > 301 344 425* 381 413*   < > = values in this interval not displayed.    Basic Metabolic Panel: Recent Labs  Lab 11/12/21 2236 11/13/21 0317 11/14/21 0456 11/15/21 0556 11/16/21 1020 11/17/21 0331 11/18/21 0433  NA  --  130* 132* 135 137 137 137  K  --  4.0 3.2* 3.5 3.4* 3.0* 3.9  CL  --  95* 98 101 100 101 105  CO2  --  '26 26 27 28 29 25  '$ GLUCOSE  --  92 90 81 146* 85 79  BUN  --  19 10 6* 7* 6* 7*  CREATININE  --  0.93 0.71 0.74 0.89 0.79 0.67  CALCIUM  --  7.6* 7.7* 7.8* 7.8* 7.7* 8.2*  MG 1.2* 1.9 1.3* 1.9  --  1.3* 1.8  PHOS 3.4  --   --   --   --   --   --     GFR: Estimated Creatinine Clearance: 64 mL/min (by C-G formula based on SCr of 0.67 mg/dL). Liver Function Tests: Recent Labs  Lab 11/13/21 0317 11/15/21 0556 11/16/21 1020 11/17/21 0331 11/18/21 0433  AST '18 17 16 15 '$ 14*  ALT '22 18 16 15 15  '$ ALKPHOS 124 122 126 124 127*  BILITOT 0.6 0.4 0.6 0.3 0.4  PROT 5.2* 4.9* 5.0* 5.0* 5.1*  ALBUMIN 2.0* 1.8* 1.9* 1.8* 1.9*    Recent Labs  Lab 11/12/21 1818  LIPASE 21    No results for input(s): AMMONIA in the last 168 hours. Coagulation Profile: No results for input(s): INR, PROTIME in the last 168 hours. Cardiac Enzymes: Recent Labs  Lab 11/12/21 2236  CKTOTAL 39    BNP (last 3 results) No results for input(s): PROBNP in the last 8760 hours. HbA1C: No results for input(s): HGBA1C in the last 72 hours. CBG: No results for input(s): GLUCAP in the last 168 hours.  Lipid Profile: No results for input(s): CHOL, HDL, LDLCALC, TRIG, CHOLHDL, LDLDIRECT in the last 72 hours. Thyroid Function Tests: No results  for input(s): TSH, T4TOTAL, FREET4, T3FREE, THYROIDAB in the last 72 hours.  Anemia Panel: No results for input(s): VITAMINB12, FOLATE, FERRITIN, TIBC, IRON, RETICCTPCT in the last 72 hours.  Sepsis Labs: Recent Labs  Lab 11/12/21 2236  LATICACIDVEN 1.5     Recent Results (from the past 240 hour(s))  Urine Culture     Status: Abnormal   Collection Time: 11/12/21  6:32 PM   Specimen: Urine, Clean Catch  Result Value Ref Range Status   Specimen Description   Final    URINE, CLEAN CATCH Performed at [facility], [place] [address], [place], [place] [address]    Special Requests   Final    NONE Performed at [facility], [address] [place], [place] [address]    Culture >=100,000 COLONIES/mL KLEBSIELLA PNEUMONIAE (A)  Final   Report Status 11/16/2021 FINAL  Final   Organism ID, Bacteria KLEBSIELLA PNEUMONIAE (A)  Final      Susceptibility   Klebsiella pneumoniae - MIC*    AMPICILLIN >=32 RESISTANT Resistant     CEFAZOLIN <=4 SENSITIVE Sensitive     CEFEPIME <=0.12 SENSITIVE Sensitive     CEFTRIAXONE <=0.25 SENSITIVE Sensitive     CIPROFLOXACIN <=0.25 SENSITIVE Sensitive     GENTAMICIN <=1 SENSITIVE Sensitive     IMIPENEM <=0.25 SENSITIVE Sensitive     NITROFURANTOIN 64 INTERMEDIATE Intermediate     TRIMETH/SULFA <=20 SENSITIVE Sensitive     AMPICILLIN/SULBACTAM 8 SENSITIVE Sensitive     PIP/TAZO <=4 SENSITIVE Sensitive     * >=100,000 COLONIES/mL KLEBSIELLA PNEUMONIAE  Culture, blood (routine x 2)     Status: None   Collection Time: 11/12/21  6:40 PM   Specimen: BLOOD  Result Value Ref Range Status   Specimen Description   Final    BLOOD PORTA CATH Performed at [facility] [address] [place], [place] [address]    Special Requests   Final    BOTTLES DRAWN AEROBIC AND ANAEROBIC Blood Culture adequate volume Performed at [facility] [address], [place], [place] [address]     Culture   Final    NO GROWTH 5 DAYS Performed at [facility], [place] [address], [place], [place] [address]    Report Status 11/17/2021 FINAL  Final  Culture, blood (routine x 2)     Status: None   Collection Time: 11/13/21 12:10 AM   Specimen: BLOOD  Result Value Ref Range Status   Specimen Description   Final    BLOOD RIGHT ANTECUBITAL Performed at [facility] [address] [place], [place] [address]    Special Requests   Final    BOTTLES DRAWN AEROBIC AND ANAEROBIC Blood Culture adequate volume Performed at [facility] [address] [place], [place] [address]    Culture   Final    NO GROWTH 5 DAYS Performed at [facility], [place] [address], [place], [place] [address]    Report Status 11/18/2021 FINAL  Final          Radiology Studies: No results found.      Scheduled Meds:  sodium chloride   Intravenous Once   atorvastatin  10 mg Oral QPM   Chlorhexidine Gluconate Cloth  6 each Topical Daily   diltiazem  60 mg Oral TID   famotidine  40 mg Oral BID   feeding supplement  237 mL Oral BID BM   multivitamin with minerals  1 tablet Oral Daily   potassium chloride  20 mEq Oral Daily   Continuous Infusions:  sodium chloride 75 mL/hr at 11/18/21 0329   cefTRIAXone (ROCEPHIN)  IV Stopped (11/17/21 1700)   lactated ringers Stopped (11/16/21 1555)   metronidazole 500 mg (11/18/21 1125)     LOS: 6 days    Georgette Shell, MD 11/18/2021, 12:25 PM

## 2021-11-19 ENCOUNTER — Encounter: Payer: Self-pay | Admitting: *Deleted

## 2021-11-19 DIAGNOSIS — K529 Noninfective gastroenteritis and colitis, unspecified: Secondary | ICD-10-CM | POA: Diagnosis not present

## 2021-11-19 LAB — CBC
HCT: 24.8 % — ABNORMAL LOW (ref 36.0–46.0)
Hemoglobin: 8 g/dL — ABNORMAL LOW (ref 12.0–15.0)
MCH: 31 pg (ref 26.0–34.0)
MCHC: 32.3 g/dL (ref 30.0–36.0)
MCV: 96.1 fL (ref 80.0–100.0)
Platelets: 363 10*3/uL (ref 150–400)
RBC: 2.58 MIL/uL — ABNORMAL LOW (ref 3.87–5.11)
RDW: 17.3 % — ABNORMAL HIGH (ref 11.5–15.5)
WBC: 10.6 10*3/uL — ABNORMAL HIGH (ref 4.0–10.5)
nRBC: 0.4 % — ABNORMAL HIGH (ref 0.0–0.2)

## 2021-11-19 LAB — BASIC METABOLIC PANEL
Anion gap: 6 (ref 5–15)
BUN: 6 mg/dL — ABNORMAL LOW (ref 8–23)
CO2: 25 mmol/L (ref 22–32)
Calcium: 7.6 mg/dL — ABNORMAL LOW (ref 8.9–10.3)
Chloride: 104 mmol/L (ref 98–111)
Creatinine, Ser: 0.65 mg/dL (ref 0.44–1.00)
GFR, Estimated: >60 mL/min (ref >60)
Glucose, Bld: 77 mg/dL (ref 70–99)
Potassium: 3.7 mmol/L (ref 3.5–5.1)
Sodium: 135 mmol/L (ref 135–145)

## 2021-11-19 LAB — PREPARE RBC (CROSSMATCH)

## 2021-11-19 MED ORDER — SODIUM CHLORIDE 0.9% IV SOLUTION: Freq: Once | INTRAVENOUS | Status: AC

## 2021-11-19 NOTE — Progress Notes (Signed)
Cheryl Singh is still making progress.  She is not having as much pain.  She is having little bit of rectal pain.  She did have a little bit of indigestion after having a hamburger yesterday.  Her hemoglobin is 8.  I think a transfusion probably would not be a bad idea for her.  This may help improve intestinal blood flow and could help promote better healing for that colitis.  She has had no bleeding.  There is no fever.  Klebsiella is relatively sensitive to most antibiotics.  She has had no cough or shortness of breath.  Her vital signs are temperature 98.3.  Pulse 93.  Blood pressure 137/84.  Her lungs are clear.  Cardiac exam regular rate and rhythm.  She has extra beats.  Abdomen is soft.  There is no distention.  She has good bowel sounds.  There is no fluid wave.  There is no guarding or rebound tenderness.  Extremity shows no clubbing, cyanosis or edema.  Hopefully, blood transfusion will help her.  I suspect that the Klebsiella should be resolved with the antibiotics.  The colitis may certainly take another week or so before that really gets healed.  Hopefully, she will be able to go home soon.  I know she is gotten incredible care from all the staff up on [address].  Lattie Haw, MD  [place] 7:7

## 2021-11-19 NOTE — Progress Notes (Signed)
Patient received one Unit of RBC's. Tolerated well. Dayleen stated she wasn't up to walking today. She'll try to tomorrow.

## 2021-11-19 NOTE — Care Management Important Message (Signed)
Important Message  Patient Details IM Letter given to the Patient. Name: Cheryl Singh MRN: [id number] Date of Birth: Mar 27, 1951   Medicare Important Message Given:  Yes     [name], [name] 11/19/2021, 11:59 AM

## 2021-11-19 NOTE — Progress Notes (Signed)
Patient continues to be hospitalized with colitis. Will continue to follow for post discharge needs and office follow up.   Oncology Nurse Navigator Documentation     11/19/2021    8:00 AM  Oncology Nurse Navigator Flowsheets  Navigator Follow Up Date: 11/27/2021  Navigator Follow Up Reason: Appointment Review  Navigator Location [facility]  Navigator Encounter Type Appt/Treatment Plan Review  Patient Visit Type MedOnc  Treatment Phase Active Tx  Barriers/Navigation Needs Coordination of Care;Education  Interventions None Required  Acuity Level 2-Minimal Needs (1-2 Barriers Identified)  Support Groups/Services Friends and Family  Time Spent with Patient 15

## 2021-11-19 NOTE — Progress Notes (Signed)
PT Cancellation Note  Patient Details Name: Cheryl Singh MRN: [id number] DOB: [birth year]   Cancelled Treatment:    Reason Eval/Treat Not Completed: PT screened, no needs identified, will sign off (Patient evaluated on 11/13/21 and no needs identified for skilled acute PT. Pt reports she has continued to mobilize in room independently and has not declined in functional status. Will sign off at this time. Please re-consult if there is a change.   Verner Mould, DPT Acute Rehabilitation Services Office [phone] Pager [phone]  11/19/21 2:00 PM

## 2021-11-19 NOTE — Progress Notes (Signed)
PROGRESS NOTE    Cheryl Singh  TMH:[id number] DOB: 16-Sep-1950 DOA: 11/12/2021 PCP: Glenis Smoker, MD   Brief Narrative: 71 year old female with history of hyper lipidemia hypertension anemia and metastatic bladder cancer admitted with abdominal pain CT scan of the abdomen shows diffuse thickening in the sigmoid extending towards the rectum consistent with acute colitis.  No perforation or abscess.  She also had hepatic and skeletal metastatic disease and bladder wall thickening consistent with her history of bladder cancer.  Recent PET scan done as outpatient on 11/09/2021 showed acute colitis and diverticulitis of the sigmoid and upper rectum.  Extensive bony metastasis with mixed response were seen. She lives at home with her caregiver. 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 intake.  CT of the abdomen shows acute colitis of the sigmoid and the rectal area. She tried regular food yesterday with increased abdominal pain. We will will continue to advance diet as tolerated. Continue Rocephin and Flagyl.  #2 symptomatic iron deficiency anemia patient received 1 unit of packed RBC on admission Hemoglobin 7.9 will monitor closely 1 unit of blood transfusion 11/19/2021  #3 hypokalemia/hypomagnesemia replete and recheck labs in AM.  #4 metastatic bladder cancer appreciate oncology input  #5 history of essential hypertension pressure stable 132/84  #6 hyperlipidemia on Lipitor  #7 Klebsiella UTI sensitive to Rocephin  Nutrition Problem: Moderate Malnutrition Etiology:  chronic illness, cancer and cancer related treatments     Signs/Symptoms: mild fat depletion, mild muscle depletion, percent weight loss    Interventions: Boost Breeze, Ensure Enlive (each supplement provides 350kcal and 20 grams of protein), MVI  Estimated body mass index is 25.63 kg/m as calculated from the following:   Height as of this encounter: 5' 4.5" (1.638 m).   Weight as of this encounter: 68.8 kg.  DVT prophylaxis: scd Code Status: full Family Communication:none Disposition Plan:  Status is: Inpatient Remains inpatient appropriate because: Admitted with colitis on IV antibiotics and clear liquids   Consultants:  Oncology  Procedures: None Antimicrobials: Rocephin and Flagyl Subjective: Patient resting in bed complains of some abdominal pain with eating regular food and complains of diarrhea  objective: Vitals:   11/19/21 0955 11/19/21 1428 11/19/21 1445 11/19/21 1539  BP: (!) 142/76 126/79 125/72 137/89  Pulse: 93 93 88 88  Resp:  16  16  Temp:  97.6 F (36.4 C) 98.7 F (37.1 C) (!) 97.5 F (36.4 C)  TempSrc:  Oral Oral Oral  SpO2:  97% 94% 94%  Weight:      Height:        Intake/Output Summary (Last 24 hours) at 11/19/2021 1648 Last data filed at 11/19/2021 1539 Gross per 24 hour  Intake 2775.6 ml  Output --  Net 2775.6 ml    Filed Weights   11/13/21 1438  Weight: 68.8 kg    Examination:  General exam: Appears in nad Respiratory system: Clear to auscultation. Respiratory effort normal. Cardiovascular system: S1 & S2 heard, RRR. No JVD, murmurs, rubs, gallops or clicks. No pedal edema. Gastrointestinal system: Abdomen is nondistended, soft and tender to touch.  No organomegaly or masses felt. Normal bowel sounds heard. Central nervous system: Alert and oriented. No focal neurological deficits. Extremities: Symmetric 5 x 5 power. Skin: No rashes, lesions or ulcers Psychiatry: Judgement and insight appear normal. Mood & affect appropriate.      Data Reviewed: I have personally reviewed following labs and imaging studies  CBC: Recent Labs  Lab 11/12/21 1818 11/13/21 0317 11/15/21 0556 11/16/21 1020 11/17/21 0331 11/18/21 0433 11/19/21 0514  WBC 24.6*   < > 15.9* 16.8* 13.3* 11.9* 10.6*  NEUTROABS 18.1*  --  9.1* 11.6* 8.7* 7.9*  --   HGB 7.8*   < > 8.1* 8.3* 7.9* 7.9* 8.0*  HCT 23.2*   < > 24.6* 26.1* 24.9* 25.4* 24.8*  MCV 91.3   < > 92.5 94.6 94.3 95.8 96.1  PLT 324   < > 344 425* 381 413* 363   < > = values in this interval not displayed.    Basic Metabolic Panel: Recent Labs  Lab 11/12/21 2236 11/13/21 0317 11/14/21 0456 11/15/21 0556 11/16/21 1020 11/17/21 0331 11/18/21 0433 11/19/21 0514  NA  --  130* 132* 135 137 137 137 135  K  --  4.0 3.2* 3.5 3.4* 3.0* 3.9 3.7  CL  --  95* 98 101 100 101 105 104  CO2  --  '26 26 27 28 29 25 25  '$ GLUCOSE  --  92 90 81 146* 85 79 77  BUN  --  19 10 6* 7* 6* 7* 6*  CREATININE  --  0.93 0.71 0.74 0.89 0.79 0.67 0.65  CALCIUM  --  7.6* 7.7* 7.8* 7.8* 7.7* 8.2* 7.6*  MG 1.2* 1.9 1.3* 1.9  --  1.3* 1.8  --   PHOS 3.4  --   --   --   --   --   --   --     GFR: Estimated Creatinine Clearance: 64 mL/min (by C-G formula based on SCr of 0.65 mg/dL). Liver Function Tests: Recent Labs  Lab 11/13/21 0317 11/15/21 0556 11/16/21 1020 11/17/21 0331 11/18/21 0433  AST '18 17 16 15 '$ 14*  ALT '22 18 16 15 15  '$ ALKPHOS 124 122 126 124 127*  BILITOT 0.6 0.4 0.6 0.3 0.4  PROT 5.2* 4.9* 5.0* 5.0* 5.1*  ALBUMIN 2.0* 1.8* 1.9* 1.8* 1.9*    Recent Labs  Lab 11/12/21 1818  LIPASE 21    No results for input(s): AMMONIA in the last 168 hours. Coagulation Profile: No results for input(s): INR, PROTIME in the last 168 hours. Cardiac Enzymes: Recent Labs  Lab 11/12/21 2236  CKTOTAL 39    BNP (last 3 results) No results for input(s): PROBNP in the last 8760 hours. HbA1C: No results for input(s): HGBA1C in the last 72 hours. CBG: No results for input(s): GLUCAP in  the last 168 hours.  Lipid Profile: No results for input(s): CHOL, HDL, LDLCALC, TRIG, CHOLHDL, LDLDIRECT in the last 72 hours. Thyroid Function Tests: No results for input(s): TSH, T4TOTAL, FREET4, T3FREE, THYROIDAB in the last 72 hours.  Anemia Panel: No results for input(s): VITAMINB12, FOLATE, FERRITIN, TIBC, IRON, RETICCTPCT in the last 72 hours.  Sepsis Labs: Recent Labs  Lab 11/12/21 2236  LATICACIDVEN 1.5     Recent Results (from the past 240 hour(s))  Urine Culture     Status: Abnormal   Collection Time: 11/12/21  6:32 PM   Specimen: Urine, Clean Catch  Result Value Ref Range Status   Specimen Description   Final  URINE, CLEAN CATCH Performed at [facility], [place] [address] [place], [place] [address]    Special Requests   Final    NONE Performed at [facility], [place] [address], [place], [place] [address]    Culture >=100,000 COLONIES/mL KLEBSIELLA PNEUMONIAE (A)  Final   Report Status 11/16/2021 FINAL  Final   Organism ID, Bacteria KLEBSIELLA PNEUMONIAE (A)  Final      Susceptibility   Klebsiella pneumoniae - MIC*    AMPICILLIN >=32 RESISTANT Resistant     CEFAZOLIN <=4 SENSITIVE Sensitive     CEFEPIME <=0.12 SENSITIVE Sensitive     CEFTRIAXONE <=0.25 SENSITIVE Sensitive     CIPROFLOXACIN <=0.25 SENSITIVE Sensitive     GENTAMICIN <=1 SENSITIVE Sensitive     IMIPENEM <=0.25 SENSITIVE Sensitive     NITROFURANTOIN 64 INTERMEDIATE Intermediate     TRIMETH/SULFA <=20 SENSITIVE Sensitive     AMPICILLIN/SULBACTAM 8 SENSITIVE Sensitive     PIP/TAZO <=4 SENSITIVE Sensitive     * >=100,000 COLONIES/mL KLEBSIELLA PNEUMONIAE  Culture, blood (routine x 2)     Status: None   Collection Time: 11/12/21  6:40 PM   Specimen: BLOOD  Result Value Ref Range Status   Specimen Description   Final    BLOOD PORTA CATH Performed at [place] [address] [place], [place] [address]    Special Requests   Final     BOTTLES DRAWN AEROBIC AND ANAEROBIC Blood Culture adequate volume Performed at [place] [address], [place], [place] [address]    Culture   Final    NO GROWTH 5 DAYS Performed at [facility], [place] [address] [place], [place] [address]    Report Status 11/17/2021 FINAL  Final  Culture, blood (routine x 2)     Status: None   Collection Time: 11/13/21 12:10 AM   Specimen: BLOOD  Result Value Ref Range Status   Specimen Description   Final    BLOOD RIGHT ANTECUBITAL Performed at [place] [address] [place], [place] [address]    Special Requests   Final    BOTTLES DRAWN AEROBIC AND ANAEROBIC Blood Culture adequate volume Performed at [place] [address] [place], [place] [address]    Culture   Final    NO GROWTH 5 DAYS Performed at [facility], [place] [address], [place], [place] [address]    Report Status 11/18/2021 FINAL  Final          Radiology Studies: No results found.      Scheduled Meds:  sodium chloride   Intravenous Once   sodium chloride   Intravenous Once   atorvastatin  10 mg Oral QPM   Chlorhexidine Gluconate Cloth  6 each Topical Daily   diltiazem  60 mg Oral TID   famotidine  40 mg Oral BID   feeding supplement  237 mL Oral BID BM   multivitamin with minerals  1 tablet Oral Daily   potassium chloride  20 mEq Oral Daily   Continuous Infusions:  sodium chloride 75 mL/hr at 11/19/21 0321   cefTRIAXone (ROCEPHIN)  IV 2 g (11/19/21 1334)   lactated ringers Stopped (11/16/21 1555)   metronidazole 500 mg (11/19/21 1008)     LOS: 7 days    Georgette Shell, MD 11/19/2021, 4:48 PM

## 2021-11-20 ENCOUNTER — Inpatient Hospital Stay: Payer: Medicare PPO

## 2021-11-20 DIAGNOSIS — K529 Noninfective gastroenteritis and colitis, unspecified: Secondary | ICD-10-CM | POA: Diagnosis not present

## 2021-11-20 LAB — BPAM RBC
Blood Product Expiration Date: 202306162359
ISSUE DATE / TIME: 202305221507
Unit Type and Rh: 6200

## 2021-11-20 LAB — TYPE AND SCREEN
ABO/RH(D): AB POS
Antibody Screen: NEGATIVE
Unit division: 0

## 2021-11-20 LAB — CBC
HCT: 30.2 % — ABNORMAL LOW (ref 36.0–46.0)
Hemoglobin: 9.6 g/dL — ABNORMAL LOW (ref 12.0–15.0)
MCH: 29.7 pg (ref 26.0–34.0)
MCHC: 31.8 g/dL (ref 30.0–36.0)
MCV: 93.5 fL (ref 80.0–100.0)
Platelets: 349 10*3/uL (ref 150–400)
RBC: 3.23 MIL/uL — ABNORMAL LOW (ref 3.87–5.11)
RDW: 17.9 % — ABNORMAL HIGH (ref 11.5–15.5)
WBC: 9.5 10*3/uL (ref 4.0–10.5)
nRBC: 0.2 % (ref 0.0–0.2)

## 2021-11-20 MED ORDER — ONDANSETRON HCL 4 MG/2ML IJ SOLN
4.0000 mg | Freq: Four times a day (QID) | INTRAMUSCULAR | Status: DC | PRN
  Administered 2021-11-20: 4 mg via INTRAVENOUS
  Filled 2021-11-20: qty 2

## 2021-11-20 NOTE — Plan of Care (Signed)

## 2021-11-20 NOTE — Progress Notes (Addendum)
PROGRESS NOTE    Cheryl Singh  OMB:[id number] DOB: 04/16/1951 DOA: 11/12/2021 PCP: Glenis Smoker, MD   Brief Narrative: 71 year old female with history of hyper lipidemia hypertension anemia and metastatic bladder cancer admitted with abdominal pain CT scan of the abdomen shows diffuse thickening in the sigmoid extending towards the rectum consistent with acute colitis.  No perforation or abscess.  She also had hepatic and skeletal metastatic disease and bladder wall thickening consistent with her history of bladder cancer.  Recent PET scan done as outpatient on 11/09/2021 showed acute colitis and diverticulitis of the sigmoid and upper rectum.  Extensive bony metastasis with mixed response were seen. She lives at home with her caregiver. 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 intake.   CT of the abdomen shows acute colitis of the sigmoid and the rectal area. She tried regular food yesterday with increased abdominal pain. We will will continue to advance diet as tolerated. Continue Rocephin and Flagyl till 11/21/2021 to finish a course of 10 days.  #2 symptomatic iron deficiency anemia-she received 2 units of packed RBC during this hospital stay with improvement in hemoglobin.    #3 hypokalemia/hypomagnesemia resolved.    #4 metastatic bladder cancer appreciate oncology input  #5 history of essential hypertension pressure stable 125/85  #6 hyperlipidemia on Lipitor  #7 Klebsiella UTI sensitive to Rocephin and itching course tomorrow 11/22/2021.  Nutrition Problem:  Moderate Malnutrition Etiology: chronic illness, cancer and cancer related treatments     Signs/Symptoms: mild fat depletion, mild muscle depletion, percent weight loss    Interventions: Boost Breeze, Ensure Enlive (each supplement provides 350kcal and 20 grams of protein), MVI  Estimated body mass index is 25.63 kg/m as calculated from the following:   Height as of this encounter: 5' 4.5" (1.638 m).   Weight as of this encounter: 68.8 kg.  DVT prophylaxis: scd Code Status: full Family Communication:none Disposition Plan:  Status is: Inpatient Remains inpatient appropriate because: Admitted with colitis on IV antibiotics and clear liquids   Consultants:  Oncology  Procedures: None Antimicrobials: Rocephin and Flagyl Subjective: She is resting in bed still complains of abdominal pain nausea unable to keep food down objective: Vitals:   11/19/21 2039 11/20/21 0511 11/20/21 0938 11/20/21 1330  BP: 137/84 (!) 146/87 133/88 125/85  Pulse: 92 89  82  Resp: '16 16  16  '$ Temp: 98.3 F (36.8 C) (!) 97.4 F (36.3 C)  97.9 F (36.6 C)  TempSrc: Oral Oral  Oral  SpO2: 95% 97%  94%  Weight:      Height:        Intake/Output Summary (Last 24 hours) at 11/20/2021 1405 Last data filed at 11/20/2021 0900 Gross per 24 hour  Intake 1147.05 ml  Output --  Net 1147.05 ml    Filed Weights   11/13/21 1438  Weight: 68.8 kg    Examination:  General exam: Appears in nad Respiratory system: Clear to auscultation. Respiratory effort normal. Cardiovascular system: S1 & S2 heard, RRR. No JVD, murmurs, rubs, gallops or clicks. No pedal edema. Gastrointestinal system: Abdomen is nondistended,  soft and tender to touch.  No organomegaly or masses felt. Normal bowel sounds heard. Central nervous system: Alert and oriented. No focal neurological deficits. Extremities: Symmetric 5 x 5 power. Skin: No rashes, lesions or ulcers Psychiatry: Judgement and insight appear normal. Mood & affect  appropriate.     Data Reviewed: I have personally reviewed following labs and imaging studies  CBC: Recent Labs  Lab 11/15/21 0556 11/16/21 1020 11/17/21 0331 11/18/21 0433 11/19/21 0514 11/20/21 0517  WBC 15.9* 16.8* 13.3* 11.9* 10.6* 9.5  NEUTROABS 9.1* 11.6* 8.7* 7.9*  --   --   HGB 8.1* 8.3* 7.9* 7.9* 8.0* 9.6*  HCT 24.6* 26.1* 24.9* 25.4* 24.8* 30.2*  MCV 92.5 94.6 94.3 95.8 96.1 93.5  PLT 344 425* 381 413* 363 628    Basic Metabolic Panel: Recent Labs  Lab 11/14/21 0456 11/15/21 0556 11/16/21 1020 11/17/21 0331 11/18/21 0433 11/19/21 0514  NA 132* 135 137 137 137 135  K 3.2* 3.5 3.4* 3.0* 3.9 3.7  CL 98 101 100 101 105 104  CO2 '26 27 28 29 25 25  '$ GLUCOSE 90 81 146* 85 79 77  BUN 10 6* 7* 6* 7* 6*  CREATININE 0.71 0.74 0.89 0.79 0.67 0.65  CALCIUM 7.7* 7.8* 7.8* 7.7* 8.2* 7.6*  MG 1.3* 1.9  --  1.3* 1.8  --     GFR: Estimated Creatinine Clearance: 64 mL/min (by C-G formula based on SCr of 0.65 mg/dL). Liver Function Tests: Recent Labs  Lab 11/15/21 0556 11/16/21 1020 11/17/21 0331 11/18/21 0433  AST '17 16 15 '$ 14*  ALT '18 16 15 15  '$ ALKPHOS 122 126 124 127*  BILITOT 0.4 0.6 0.3 0.4  PROT 4.9* 5.0* 5.0* 5.1*  ALBUMIN 1.8* 1.9* 1.8* 1.9*    No results for input(s): LIPASE, AMYLASE in the last 168 hours.  No results for input(s): AMMONIA in the last 168 hours. Coagulation Profile: No results for input(s): INR, PROTIME in the last 168 hours. Cardiac Enzymes: No results for input(s): CKTOTAL, CKMB, CKMBINDEX, TROPONINI in the last 168 hours.  BNP (last 3 results) No results for input(s): PROBNP in the last 8760 hours. HbA1C: No results for input(s): HGBA1C in the last 72 hours. CBG: No results for input(s): GLUCAP in the last 168 hours.  Lipid Profile: No results for input(s): CHOL, HDL, LDLCALC, TRIG, CHOLHDL, LDLDIRECT in the last 72 hours. Thyroid Function Tests: No results for input(s): TSH, T4TOTAL, FREET4, T3FREE, THYROIDAB in the last  72 hours.  Anemia Panel: No results for input(s): VITAMINB12, FOLATE, FERRITIN, TIBC, IRON, RETICCTPCT in the last 72 hours.  Sepsis Labs: No results for input(s): PROCALCITON, LATICACIDVEN in the last 168 hours.   Recent Results (from the past 240 hour(s))  Urine Culture     Status: Abnormal   Collection Time: 11/12/21  6:32 PM   Specimen: Urine, Clean Catch  Result Value Ref Range Status   Specimen Description   Final    URINE, CLEAN CATCH Performed at [facility], [place] [address], [place], [place] [address]    Special Requests   Final    NONE Performed at [facility], [place] [address] [place], [place] [address]    Culture >=100,000 COLONIES/mL KLEBSIELLA PNEUMONIAE (A)  Final   Report Status 11/16/2021 FINAL  Final   Organism ID, Bacteria KLEBSIELLA PNEUMONIAE (A)  Final      Susceptibility   Klebsiella pneumoniae - MIC*    AMPICILLIN >=32 RESISTANT Resistant     CEFAZOLIN <=  4 SENSITIVE Sensitive     CEFEPIME <=0.12 SENSITIVE Sensitive     CEFTRIAXONE <=0.25 SENSITIVE Sensitive     CIPROFLOXACIN <=0.25 SENSITIVE Sensitive     GENTAMICIN <=1 SENSITIVE Sensitive     IMIPENEM <=0.25 SENSITIVE Sensitive     NITROFURANTOIN 64 INTERMEDIATE Intermediate     TRIMETH/SULFA <=20 SENSITIVE Sensitive     AMPICILLIN/SULBACTAM 8 SENSITIVE Sensitive     PIP/TAZO <=4 SENSITIVE Sensitive     * >=100,000 COLONIES/mL KLEBSIELLA PNEUMONIAE  Culture, blood (routine x 2)     Status: None   Collection Time: 11/12/21  6:40 PM   Specimen: BLOOD  Result Value Ref Range Status   Specimen Description   Final    BLOOD PORTA CATH Performed at [facility] [address], [place], [place] [address]    Special Requests   Final    BOTTLES DRAWN AEROBIC AND ANAEROBIC Blood Culture adequate volume Performed at [place] [address], [place], [place] [address]    Culture   Final    NO GROWTH 5 DAYS Performed at  [facility], [place] [address], [place], [place] [address]    Report Status 11/17/2021 FINAL  Final  Culture, blood (routine x 2)     Status: None   Collection Time: 11/13/21 12:10 AM   Specimen: BLOOD  Result Value Ref Range Status   Specimen Description   Final    BLOOD RIGHT ANTECUBITAL Performed at [place] [address], [place], [place] [address]    Special Requests   Final    BOTTLES DRAWN AEROBIC AND ANAEROBIC Blood Culture adequate volume Performed at [place] [address], [place], [place] [address]    Culture   Final    NO GROWTH 5 DAYS Performed at [facility], [place] [address], [place], [place] [address]    Report Status 11/18/2021 FINAL  Final          Radiology Studies: No results found.      Scheduled Meds:  sodium chloride   Intravenous Once   atorvastatin  10 mg Oral QPM   Chlorhexidine Gluconate Cloth  6 each Topical Daily   diltiazem  60 mg Oral TID   famotidine  40 mg Oral BID   feeding supplement  237 mL Oral BID BM   multivitamin with minerals  1 tablet Oral Daily   potassium chloride  20 mEq Oral Daily   Continuous Infusions:  cefTRIAXone (ROCEPHIN)  IV Stopped (11/19/21 1404)   lactated ringers Stopped (11/16/21 1555)   metronidazole 500 mg (11/20/21 0951)     LOS: 8 days    Georgette Shell, MD 11/20/2021, 2:05 PM

## 2021-11-20 NOTE — Progress Notes (Signed)
Ms. Chevere got blood yesterday.  She got 2 units of blood.  Her hemoglobin is 9.6 today.  She really did not get out of bed yesterday.  She is trying to eat a little bit more.  It does not sound  like she has had a bowel movement.  She has had no bleeding.  She has had no cough or shortness of breath.  I think she still is on antibiotics with Rocephin and Flagyl.  Her pain seems to be getting better.  She really needs to move around a little bit.  She has had no issues with fever.  There is no rashes.  Her vital signs are temperature 97.4.  Pulse 89.  Blood pressure 146/87.  Her lungs are clear.  Cardiac exam regular rate and rhythm.  Abdomen is soft.  Bowel sounds are present.  She may be a little bit distended.  There is no guarding or rebound tenderness.  Extremity shows no clubbing, cyanosis or edema.  Hopefully, she will be able to go home soon.  This colitis seems to be resolving.  I would had to think that the urinary tract infection should be resolving.  Her bladder cancer has responded to treatment.  Again, I would not think about it treat her for another couple weeks at the earliest.  I know that she has had wonderful care from all the staff up on [address].  Lattie Haw, MD  Vonna Kotyk 1:9

## 2021-11-21 ENCOUNTER — Other Ambulatory Visit: Payer: Self-pay | Admitting: Hematology & Oncology

## 2021-11-21 DIAGNOSIS — K529 Noninfective gastroenteritis and colitis, unspecified: Secondary | ICD-10-CM | POA: Diagnosis not present

## 2021-11-21 DIAGNOSIS — I10 Essential (primary) hypertension: Secondary | ICD-10-CM | POA: Diagnosis not present

## 2021-11-21 DIAGNOSIS — N3001 Acute cystitis with hematuria: Secondary | ICD-10-CM | POA: Diagnosis not present

## 2021-11-21 DIAGNOSIS — E878 Other disorders of electrolyte and fluid balance, not elsewhere classified: Secondary | ICD-10-CM

## 2021-11-21 LAB — COMPREHENSIVE METABOLIC PANEL
ALT: 12 U/L (ref 0–44)
AST: 15 U/L (ref 15–41)
Albumin: 2 g/dL — ABNORMAL LOW (ref 3.5–5.0)
Alkaline Phosphatase: 122 U/L (ref 38–126)
Anion gap: 7 (ref 5–15)
BUN: 6 mg/dL — ABNORMAL LOW (ref 8–23)
CO2: 26 mmol/L (ref 22–32)
Calcium: 7.8 mg/dL — ABNORMAL LOW (ref 8.9–10.3)
Chloride: 103 mmol/L (ref 98–111)
Creatinine, Ser: 0.55 mg/dL (ref 0.44–1.00)
GFR, Estimated: >60 mL/min (ref >60)
Glucose, Bld: 78 mg/dL (ref 70–99)
Potassium: 3.9 mmol/L (ref 3.5–5.1)
Sodium: 136 mmol/L (ref 135–145)
Total Bilirubin: 0.5 mg/dL (ref 0.3–1.2)
Total Protein: 5.1 g/dL — ABNORMAL LOW (ref 6.5–8.1)

## 2021-11-21 LAB — CBC
HCT: 30.2 % — ABNORMAL LOW (ref 36.0–46.0)
Hemoglobin: 9.8 g/dL — ABNORMAL LOW (ref 12.0–15.0)
MCH: 31 pg (ref 26.0–34.0)
MCHC: 32.5 g/dL (ref 30.0–36.0)
MCV: 95.6 fL (ref 80.0–100.0)
Platelets: 328 10*3/uL (ref 150–400)
RBC: 3.16 MIL/uL — ABNORMAL LOW (ref 3.87–5.11)
RDW: 18.1 % — ABNORMAL HIGH (ref 11.5–15.5)
WBC: 9.2 10*3/uL (ref 4.0–10.5)
nRBC: 0.2 % (ref 0.0–0.2)

## 2021-11-21 MED ORDER — POLYETHYLENE GLYCOL 3350 17 G PO PACK
17.0000 g | PACK | Freq: Two times a day (BID) | ORAL | Status: DC
  Administered 2021-11-21 – 2021-11-22 (×4): 17 g via ORAL
  Filled 2021-11-21 (×4): qty 1

## 2021-11-21 MED ORDER — AMOXICILLIN-POT CLAVULANATE 875-125 MG PO TABS
1.0000 | ORAL_TABLET | Freq: Two times a day (BID) | ORAL | Status: DC
  Administered 2021-11-21 – 2021-11-23 (×5): 1 via ORAL
  Filled 2021-11-21 (×5): qty 1

## 2021-11-21 MED ORDER — SENNOSIDES-DOCUSATE SODIUM 8.6-50 MG PO TABS
2.0000 | ORAL_TABLET | Freq: Every day | ORAL | Status: DC
  Administered 2021-11-21: 2 via ORAL
  Filled 2021-11-21: qty 2

## 2021-11-21 NOTE — Progress Notes (Signed)
Cheryl Singh is about the same.  She must have a bowel movement.  We will put her on a little MiraLAX to see if this may help.  She says she walked a little bit yesterday.  She has a little bit of discomfort in the lower back.  Her labs show white cell count of 9.2.  Hemoglobin 9.8.  Platelet count 3 28,000.  Her BUN is 6 creatinine 0.55.  Her calcium 7.8 with an albumin of 2.  She is not to thrilled with the food selection.  She has had no cough or shortness of breath.  She has had no bleeding.  Her vital signs are temperature 98.4.  Pulse 93.  Blood pressure 129/79.  Her abdomen is slightly distended.  There may be a little bit of tenderness in the left lower quadrant.  She has decent bowel sounds.  Her lungs are clear bilaterally.  She has good air movement bilaterally.  Cardiac exam regular rate and rhythm.  Hopefully, she will be able to go home soon.  She is on antibiotics.  I do not know if these can be converted to oral.  Hopefully she will be able to walk a little bit more today.  Hopefully, she will be able to have a bowel movement.  I appreciate the incredible care that she is getting from the staff upon [address].  Lattie Haw, MD Michaelyn Barter 6:10

## 2021-11-21 NOTE — Plan of Care (Signed)

## 2021-11-21 NOTE — Progress Notes (Signed)
Nutrition Follow-up  DOCUMENTATION CODES:   Non-severe (moderate) malnutrition in context of chronic illness  INTERVENTION:   -Ensure Plus High Protein po BID, each supplement provides 350 kcal and 20 grams of protein.   -Multivitamin with minerals daily  NUTRITION DIAGNOSIS:   Moderate Malnutrition related to chronic illness, cancer and cancer related treatments as evidenced by mild fat depletion, mild muscle depletion, percent weight loss.  Ongoing.  GOAL:   Patient will meet greater than or equal to 90% of their needs  Progressing.  MONITOR:   PO intake, Supplement acceptance, Labs, Weight trends, I & O's  ASSESSMENT:   71 y.o. female with medical history significant of bladder cancer on chemotherapy (follows with Dr. Marin Olp), HLD, HTN who presented to the ED on evening of 11/12/2021 due to abnormal imaging after having an outpatient PET scan that showed findings of colitis as well as skeletal metastatic disease. Admitted to the hospital with colitis, on IV antibiotics and IV fluids.  Patient currently consuming 100% of meals. Awaiting to have a BM, hasn't had one since 5/19. Bowel regimen started today.  Accepting Ensure supplements.   Admission weight: 151 lbs.  Medications: Pepcid, Multivitamin with minerals daily, Miralax, KLOR-CON, Senokot  Labs reviewed.  Diet Order:   Diet Order             Diet regular Room service appropriate? Yes; Fluid consistency: Thin  Diet effective now                   EDUCATION NEEDS:   No education needs have been identified at this time  Skin:  Skin Assessment: Reviewed RN Assessment  Last BM:  5/19  Height:   Ht Readings from Last 1 Encounters:  11/13/21 5' 4.5" (1.638 m)    Weight:   Wt Readings from Last 1 Encounters:  11/13/21 68.8 kg   BMI:  Body mass index is 25.63 kg/m.  Estimated Nutritional Needs:   Kcal:  2000-2200  Protein:  100-110g  Fluid:  2L/day  Clayton Bibles, MS, RD,  LDN Inpatient Clinical Dietitian Contact information available via Amion

## 2021-11-21 NOTE — Progress Notes (Signed)
PROGRESS NOTE    Cheryl RITSEMA  IAX:[id number] DOB: 04-18-1951 DOA: 11/12/2021 PCP: Glenis Smoker, MD   Brief Narrative: 71 year old female with history of hyper lipidemia hypertension anemia and metastatic bladder cancer admitted with abdominal pain CT scan of the abdomen shows diffuse thickening in the sigmoid extending towards the rectum consistent with acute colitis.  No perforation or abscess.  She also had hepatic and skeletal metastatic disease and bladder wall thickening consistent with her history of bladder cancer.  Recent PET scan done as outpatient on 11/09/2021 showed acute colitis and diverticulitis of the sigmoid and upper rectum.  Extensive bony metastasis with mixed response were seen. She lives at home with her caregiver.   Assessment & Plan:   Acute colitis  Patient admitted with severe abdominal pain and decreased p.o. intake. CT of the abdomen shows acute colitis of the sigmoid and the rectal area. Patient was started on ceftriaxone and metronidazole.  Symptoms seem to be improving.  She is noted to be constipated but has been passing some gas.  Started on laxatives by oncology. Change antibiotics to oral.  We will change her to Augmentin.  WBC noted to be normal. Continue to mobilize.  Symptomatic iron deficiency anemia She received 2 units of packed RBC during this hospital stay with improvement in hemoglobin.  No overt bleeding noted.  Hypokalemia and hypomagnesemia Repleted  Metastatic bladder cancer Medical oncology following.  Essential hypertension Blood pressure stable.  Noted to be on diltiazem.  Hyperlipidemia Continue Lipitor.  Klebsiella UTI Sensitivities reviewed.  Being treated with antibiotics as outlined above.  Moderate protein calorie malnutrition. Nutrition Problem: Moderate Malnutrition Etiology: chronic illness, cancer and cancer related treatments Signs/Symptoms: mild fat depletion, mild muscle depletion, percent weight  loss Interventions: Boost Breeze, Ensure Enlive (each supplement provides 350kcal and 20 grams of protein), MVI   DVT prophylaxis: scd Code Status: full Family Communication:none Disposition Plan: Return home when improved.  Anticipate discharge tomorrow.  Status is: Inpatient Remains inpatient appropriate because: Admitted with colitis on IV antibiotics and clear liquids   Consultants:  Oncology  Procedures: None   Subjective: Patient mentioned that she is passing gas but has not had a bowel movement in a few days.  Denies any abdominal pain per se.  No nausea vomiting.     Objective: Vitals:   11/20/21 1720 11/20/21 2051 11/21/21 0443 11/21/21 1017  BP: (!) 141/85 133/79 129/79 115/82  Pulse:  84 93   Resp:  16 16   Temp:  97.8 F (36.6 C) 98.4 F (36.9 C)   TempSrc:  Oral Oral   SpO2:  95% 94%   Weight:      Height:        Intake/Output Summary (Last 24 hours) at 11/21/2021 1109 Last data filed at 11/21/2021 0500 Gross per 24 hour  Intake 780 ml  Output --  Net 780 ml    Filed Weights   11/13/21 1438  Weight: 68.8 kg    Examination:   General appearance: Awake alert.  In no distress Resp: Clear to auscultation bilaterally.  Normal effort Cardio: S1-S2 is normal regular.  No S3-S4.  No rubs murmurs or bruit GI: Abdomen is soft.  Mildly tender in the lower abdomen without any rebound rigidity or guarding.  No masses organomegaly. Extremities: No edema.  Full range of motion of lower extremities. Neurologic: Alert and oriented x3.  No focal neurological deficits.     Data Reviewed: I have personally reviewed following labs and imaging  studies  CBC: Recent Labs  Lab 11/15/21 0556 11/16/21 1020 11/17/21 0331 11/18/21 0433 11/19/21 0514 11/20/21 0517 11/21/21 0411  WBC 15.9* 16.8* 13.3* 11.9* 10.6* 9.5 9.2  NEUTROABS 9.1* 11.6* 8.7* 7.9*  --   --   --   HGB 8.1* 8.3* 7.9* 7.9* 8.0* 9.6* 9.8*  HCT 24.6* 26.1* 24.9* 25.4* 24.8* 30.2* 30.2*  MCV  92.5 94.6 94.3 95.8 96.1 93.5 95.6  PLT 344 425* 381 413* 363 349 431    Basic Metabolic Panel: Recent Labs  Lab 11/15/21 0556 11/16/21 1020 11/17/21 0331 11/18/21 0433 11/19/21 0514 11/21/21 0411  NA 135 137 137 137 135 136  K 3.5 3.4* 3.0* 3.9 3.7 3.9  CL 101 100 101 105 104 103  CO2 '27 28 29 25 25 26  '$ GLUCOSE 81 146* 85 79 77 78  BUN 6* 7* 6* 7* 6* 6*  CREATININE 0.74 0.89 0.79 0.67 0.65 0.55  CALCIUM 7.8* 7.8* 7.7* 8.2* 7.6* 7.8*  MG 1.9  --  1.3* 1.8  --   --     GFR: Estimated Creatinine Clearance: 64 mL/min (by C-G formula based on SCr of 0.55 mg/dL). Liver Function Tests: Recent Labs  Lab 11/15/21 0556 11/16/21 1020 11/17/21 0331 11/18/21 0433 11/21/21 0411  AST '17 16 15 '$ 14* 15  ALT '18 16 15 15 12  '$ ALKPHOS 122 126 124 127* 122  BILITOT 0.4 0.6 0.3 0.4 0.5  PROT 4.9* 5.0* 5.0* 5.1* 5.1*  ALBUMIN 1.8* 1.9* 1.8* 1.9* 2.0*       Recent Results (from the past 240 hour(s))  Urine Culture     Status: Abnormal   Collection Time: 11/12/21  6:32 PM   Specimen: Urine, Clean Catch  Result Value Ref Range Status   Specimen Description   Final    URINE, CLEAN CATCH Performed at [facility], [place] [address] [place], [place] [address]    Special Requests   Final    NONE Performed at [facility], [place] [address], [place], [place] [address]    Culture >=100,000 COLONIES/mL KLEBSIELLA PNEUMONIAE (A)  Final   Report Status 11/16/2021 FINAL  Final   Organism ID, Bacteria KLEBSIELLA PNEUMONIAE (A)  Final      Susceptibility   Klebsiella pneumoniae - MIC*    AMPICILLIN >=32 RESISTANT Resistant     CEFAZOLIN <=4 SENSITIVE Sensitive     CEFEPIME <=0.12 SENSITIVE Sensitive     CEFTRIAXONE <=0.25 SENSITIVE Sensitive     CIPROFLOXACIN <=0.25 SENSITIVE Sensitive     GENTAMICIN <=1 SENSITIVE Sensitive     IMIPENEM <=0.25 SENSITIVE Sensitive     NITROFURANTOIN 64 INTERMEDIATE Intermediate     TRIMETH/SULFA <=20 SENSITIVE Sensitive      AMPICILLIN/SULBACTAM 8 SENSITIVE Sensitive     PIP/TAZO <=4 SENSITIVE Sensitive     * >=100,000 COLONIES/mL KLEBSIELLA PNEUMONIAE  Culture, blood (routine x 2)     Status: None   Collection Time: 11/12/21  6:40 PM   Specimen: BLOOD  Result Value Ref Range Status   Specimen Description   Final    BLOOD PORTA CATH Performed at [facility] [address], [place], [place] [address]    Special Requests   Final    BOTTLES DRAWN AEROBIC AND ANAEROBIC Blood Culture adequate volume Performed at [facility] [address], [place], [place] [address]    Culture   Final    NO GROWTH 5 DAYS Performed at [facility], [address] [place], [place] [address]    Report Status 11/17/2021 FINAL  Final  Culture, blood (routine x 2)     Status: None   Collection Time: 11/13/21 12:10 AM   Specimen: BLOOD  Result Value Ref Range Status   Specimen Description   Final    BLOOD RIGHT ANTECUBITAL Performed at [facility] [address], [place], [place] [address]    Special Requests   Final    BOTTLES DRAWN AEROBIC AND ANAEROBIC Blood Culture adequate volume Performed at [facility] [address] [place], [place] [address]    Culture   Final    NO GROWTH 5 DAYS Performed at [facility], [place] [address], [place], [place] [address]    Report Status 11/18/2021 FINAL  Final      Radiology Studies: No results found.    Scheduled Meds:  sodium chloride   Intravenous Once   amoxicillin-clavulanate  1 tablet Oral Q12H   atorvastatin  10 mg Oral QPM   Chlorhexidine Gluconate Cloth  6 each Topical Daily   diltiazem  60 mg Oral TID   famotidine  40 mg Oral BID   feeding supplement  237 mL Oral BID BM   multivitamin with minerals  1 tablet Oral Daily   polyethylene glycol  17 g Oral BID   potassium chloride  20 mEq Oral Daily   senna-docusate  2 tablet Oral QHS   Continuous  Infusions:     LOS: 9 days    Bonnielee Haff, MD 11/21/2021, 11:09 AM

## 2021-11-22 DIAGNOSIS — N3001 Acute cystitis with hematuria: Secondary | ICD-10-CM | POA: Diagnosis not present

## 2021-11-22 DIAGNOSIS — K529 Noninfective gastroenteritis and colitis, unspecified: Secondary | ICD-10-CM | POA: Diagnosis not present

## 2021-11-22 DIAGNOSIS — I10 Essential (primary) hypertension: Secondary | ICD-10-CM | POA: Diagnosis not present

## 2021-11-22 LAB — ERYTHROPOIETIN: Erythropoietin: 17.8 m[IU]/mL (ref 2.6–18.5)

## 2021-11-22 MED ORDER — DARBEPOETIN ALFA 300 MCG/0.6ML IJ SOSY
300.0000 ug | PREFILLED_SYRINGE | Freq: Once | INTRAMUSCULAR | Status: AC
  Administered 2021-11-22: 300 ug via SUBCUTANEOUS
  Filled 2021-11-22: qty 0.6

## 2021-11-22 MED ORDER — BISACODYL 5 MG PO TBEC
10.0000 mg | DELAYED_RELEASE_TABLET | Freq: Once | ORAL | Status: AC
  Administered 2021-11-22: 10 mg via ORAL
  Filled 2021-11-22: qty 2

## 2021-11-22 MED ORDER — SENNOSIDES-DOCUSATE SODIUM 8.6-50 MG PO TABS
2.0000 | ORAL_TABLET | Freq: Two times a day (BID) | ORAL | Status: DC
  Administered 2021-11-22 – 2021-11-23 (×2): 2 via ORAL
  Filled 2021-11-22 (×3): qty 2

## 2021-11-22 MED ORDER — BISACODYL 10 MG RE SUPP: 10.0000 mg | Freq: Once | RECTAL | Status: DC

## 2021-11-22 NOTE — Progress Notes (Signed)
PROGRESS NOTE    Cheryl Singh  EHM:[id number] DOB: 10-16-1950 DOA: 11/12/2021 PCP: Glenis Smoker, MD   Brief Narrative: 71 year old female with history of hyper lipidemia hypertension anemia and metastatic bladder cancer admitted with abdominal pain CT scan of the abdomen shows diffuse thickening in the sigmoid extending towards the rectum consistent with acute colitis.  No perforation or abscess.  She also had hepatic and skeletal metastatic disease and bladder wall thickening consistent with her history of bladder cancer.  Recent PET scan done as outpatient on 11/09/2021 showed acute colitis and diverticulitis of the sigmoid and upper rectum.  Extensive bony metastasis with mixed response were seen. She lives at home with her caregiver.   Assessment & Plan:   Acute colitis  Patient admitted with severe abdominal pain and decreased p.o. intake. CT of the abdomen shows acute colitis of the sigmoid and the rectal area. Patient was started on ceftriaxone and metronidazole.   Symptoms have improved.  Continues to have abdominal pain on and off.  Still reports being constipated but is passing gas.  Started on MiraLAX yesterday.  Will add Senokot and give her a dose of Dulcolax.   Changed over to oral antibiotics yesterday.  WBC has been normal.   Continue to mobilize.  Symptomatic iron deficiency anemia She received 2 units of packed RBC during this hospital stay with improvement in hemoglobin.  No overt bleeding noted. Hemoglobin noted to be stable.  Hypokalemia and hypomagnesemia Repleted  Metastatic bladder cancer Medical oncology following.  Essential hypertension Blood pressure stable.  Noted to be on diltiazem.  Hyperlipidemia Continue Lipitor.  Klebsiella UTI Sensitivities reviewed.  Being treated with antibiotics as outlined above.  Moderate protein calorie malnutrition. Nutrition Problem: Moderate Malnutrition Etiology: chronic illness, cancer and cancer  related treatments Signs/Symptoms: mild fat depletion, mild muscle depletion, percent weight loss Interventions: Boost Breeze, Ensure Enlive (each supplement provides 350kcal and 20 grams of protein), MVI   DVT prophylaxis: SCDs Code Status: Full code Family Communication: Discussed with patient Disposition Plan: Return home when improved.  Waiting on bowel movement.  Possible discharge in 24 to 48 hours.  Status is: Inpatient Remains inpatient appropriate because: Admitted with colitis   Consultants:  Oncology  Procedures: None   Subjective: Patient continues to feel constipated.  Denies any nausea vomiting.  Passing gas.  Occasional abdominal pain.  None currently.   Objective: Vitals:   11/21/21 1714 11/21/21 2107 11/22/21 0454 11/22/21 0946  BP: (!) 129/93 133/76 131/90 (!) 147/86  Pulse:  92 82   Resp:  16 18   Temp:  98.2 F (36.8 C) 97.9 F (36.6 C)   TempSrc:  Oral Oral   SpO2:  96% 95%   Weight:      Height:        Intake/Output Summary (Last 24 hours) at 11/22/2021 1043 Last data filed at 11/21/2021 1300 Gross per 24 hour  Intake 235 ml  Output --  Net 235 ml    Filed Weights   11/13/21 1438  Weight: 68.8 kg    Examination:   General appearance: Awake alert.  In no distress Resp: Clear to auscultation bilaterally.  Normal effort Cardio: S1-S2 is normal regular.  No S3-S4.  No rubs murmurs or bruit GI: Abdomen is soft.  Nontender nondistended.  Bowel sounds are present normal.  No masses organomegaly Extremities: No edema.  Full range of motion of lower extremities. Neurologic: Alert and oriented x3.  No focal neurological deficits.  Data Reviewed: I have personally reviewed following labs and imaging studies  CBC: Recent Labs  Lab 11/16/21 1020 11/17/21 0331 11/18/21 0433 11/19/21 0514 11/20/21 0517 11/21/21 0411  WBC 16.8* 13.3* 11.9* 10.6* 9.5 9.2  NEUTROABS 11.6* 8.7* 7.9*  --   --   --   HGB 8.3* 7.9* 7.9* 8.0* 9.6* 9.8*   HCT 26.1* 24.9* 25.4* 24.8* 30.2* 30.2*  MCV 94.6 94.3 95.8 96.1 93.5 95.6  PLT 425* 381 413* 363 349 433    Basic Metabolic Panel: Recent Labs  Lab 11/16/21 1020 11/17/21 0331 11/18/21 0433 11/19/21 0514 11/21/21 0411  NA 137 137 137 135 136  K 3.4* 3.0* 3.9 3.7 3.9  CL 100 101 105 104 103  CO2 '28 29 25 25 26  '$ GLUCOSE 146* 85 79 77 78  BUN 7* 6* 7* 6* 6*  CREATININE 0.89 0.79 0.67 0.65 0.55  CALCIUM 7.8* 7.7* 8.2* 7.6* 7.8*  MG  --  1.3* 1.8  --   --     GFR: Estimated Creatinine Clearance: 64 mL/min (by C-G formula based on SCr of 0.55 mg/dL). Liver Function Tests: Recent Labs  Lab 11/16/21 1020 11/17/21 0331 11/18/21 0433 11/21/21 0411  AST 16 15 14* 15  ALT '16 15 15 12  '$ ALKPHOS 126 124 127* 122  BILITOT 0.6 0.3 0.4 0.5  PROT 5.0* 5.0* 5.1* 5.1*  ALBUMIN 1.9* 1.8* 1.9* 2.0*     Recent Results (from the past 240 hour(s))  Urine Culture     Status: Abnormal   Collection Time: 11/12/21  6:32 PM   Specimen: Urine, Clean Catch  Result Value Ref Range Status   Specimen Description   Final    URINE, CLEAN CATCH Performed at [facility], [place] [address], [place], [place] [address]    Special Requests   Final    NONE Performed at [facility], [place] [address] [place], [place] [address]    Culture >=100,000 COLONIES/mL KLEBSIELLA PNEUMONIAE (A)  Final   Report Status 11/16/2021 FINAL  Final   Organism ID, Bacteria KLEBSIELLA PNEUMONIAE (A)  Final      Susceptibility   Klebsiella pneumoniae - MIC*    AMPICILLIN >=32 RESISTANT Resistant     CEFAZOLIN <=4 SENSITIVE Sensitive     CEFEPIME <=0.12 SENSITIVE Sensitive     CEFTRIAXONE <=0.25 SENSITIVE Sensitive     CIPROFLOXACIN <=0.25 SENSITIVE Sensitive     GENTAMICIN <=1 SENSITIVE Sensitive     IMIPENEM <=0.25 SENSITIVE Sensitive     NITROFURANTOIN 64 INTERMEDIATE Intermediate     TRIMETH/SULFA <=20 SENSITIVE Sensitive     AMPICILLIN/SULBACTAM 8 SENSITIVE Sensitive      PIP/TAZO <=4 SENSITIVE Sensitive     * >=100,000 COLONIES/mL KLEBSIELLA PNEUMONIAE  Culture, blood (routine x 2)     Status: None   Collection Time: 11/12/21  6:40 PM   Specimen: BLOOD  Result Value Ref Range Status   Specimen Description   Final    BLOOD PORTA CATH Performed at [facility] [address] [place], [place] [address]    Special Requests   Final    BOTTLES DRAWN AEROBIC AND ANAEROBIC Blood Culture adequate volume Performed at [place] [address], [place], [place] [address]    Culture   Final    NO GROWTH 5 DAYS Performed at [facility], [place] [address], [place], [place] [address]    Report Status 11/17/2021 FINAL  Final  Culture, blood (routine  x 2)     Status: None   Collection Time: 11/13/21 12:10 AM   Specimen: BLOOD  Result Value Ref Range Status   Specimen Description   Final    BLOOD RIGHT ANTECUBITAL Performed at [facility] [address] [place], [place] [address]    Special Requests   Final    BOTTLES DRAWN AEROBIC AND ANAEROBIC Blood Culture adequate volume Performed at [facility] [address] [place], [place] [address]    Culture   Final    NO GROWTH 5 DAYS Performed at [facility], [place] [address] [place],  [address]    Report Status 11/18/2021 FINAL  Final      Radiology Studies: No results found.    Scheduled Meds:  sodium chloride   Intravenous Once   amoxicillin-clavulanate  1 tablet Oral Q12H   atorvastatin  10 mg Oral QPM   bisacodyl  10 mg Oral Once   Chlorhexidine Gluconate Cloth  6 each Topical Daily   diltiazem  60 mg Oral TID   famotidine  40 mg Oral BID   feeding supplement  237 mL Oral BID BM   multivitamin with minerals  1 tablet Oral Daily   polyethylene glycol  17 g Oral BID   potassium chloride  20 mEq Oral Daily   senna-docusate  2 tablet Oral BID   Continuous Infusions:     LOS: 10 days     Bonnielee Haff, MD 11/22/2021, 10:43 AM

## 2021-11-22 NOTE — TOC Progression Note (Signed)
Transition of Care [facility]) - Progression Note    Patient Details  Name: Cheryl Singh MRN: [id number] Date of Birth: 07-22-1950  Transition of Care [facility]) CM/SW Contact  Raif Chachere, Marjie Skiff, RN Phone Number: 11/22/2021, 10:59 AM  Clinical Narrative:     TOC continues to follow along for dc needs as pt progresses medically.      Social Determinants of Health (SDOH) Interventions    Readmission Risk Interventions     View : No data to display.

## 2021-11-23 DIAGNOSIS — K529 Noninfective gastroenteritis and colitis, unspecified: Secondary | ICD-10-CM | POA: Diagnosis not present

## 2021-11-23 MED ORDER — AMOXICILLIN-POT CLAVULANATE 875-125 MG PO TABS
1.0000 | ORAL_TABLET | Freq: Two times a day (BID) | ORAL | 0 refills | Status: AC
Start: 2021-11-23 — End: 2021-11-26

## 2021-11-23 MED ORDER — HEPARIN SOD (PORK) LOCK FLUSH 100 UNIT/ML IV SOLN
500.0000 [IU] | Freq: Once | INTRAVENOUS | Status: AC
  Administered 2021-11-23: 500 [IU] via INTRAVENOUS
  Filled 2021-11-23: qty 5

## 2021-11-23 MED ORDER — SENNOSIDES-DOCUSATE SODIUM 8.6-50 MG PO TABS: 2.0000 | ORAL_TABLET | Freq: Two times a day (BID) | ORAL | 1 refills | Status: AC

## 2021-11-23 MED ORDER — OXYCODONE-ACETAMINOPHEN 5-325 MG PO TABS
1.0000 | ORAL_TABLET | ORAL | 0 refills | Status: DC | PRN
Start: 2021-11-23 — End: 2021-11-30

## 2021-11-23 MED ORDER — POLYETHYLENE GLYCOL 3350 17 G PO PACK
17.0000 g | PACK | Freq: Every day | ORAL | Status: DC
  Administered 2021-11-23: 17 g via ORAL

## 2021-11-23 NOTE — Progress Notes (Signed)
Overall, I still think she is making some progress.  She says she had little bit of a tough day yesterday.  She is having little more of pain in the lower abdomen.  She had no vomiting.  She had little bit of diarrhea.  This may be from the MiraLAX that she is taking.  No labs are back yet today.  She is having no problems with urinating.  It probably would not be a bad idea to recheck a urinalysis on her to make sure that the Klebsiella is resolved.  She has had no fever.  There has been no issues with cough or shortness of breath.  She did get a dose of Aranesp yesterday.  Her vital signs are temperature 97.8.  Pulse 87.  Blood pressure 144/83.  Her head neck exam shows no ocular or oral lesions.  Lungs are clear.  Cardiac exam regular rate and rhythm with an occasional extra beat.  Abdomen is soft.  Bowel sounds are present.  She has a little bit of distention.  She has no guarding or rebound tenderness.  Extremity shows no clubbing, cyanosis or edema.  It would be nice if Cheryl Singh could go home today or tomorrow at the latest.  Maybe, the cardiac monitor can be taken off work.  Again will be worthwhile rechecking a urine culture on her to make sure that the Klebsiella has resolved.  There is really not much that we are doing right now with her.  This is all non-oncologic which is a nice change of pace for [place].  I know she is gotten incredible care from all the staff up on [address].  Lattie Haw, MD  Michaelyn Barter 6:18

## 2021-11-23 NOTE — Plan of Care (Signed)

## 2021-11-23 NOTE — Discharge Summary (Signed)
[facility]  Physician Discharge Summary   Patient ID: Cheryl Singh MRN: [id number] DOB/AGE: [birth year] 71 y.o.  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)   HLD (hyperlipidemia)   GERD (gastroesophageal reflux disease)   Hypokalemia   Malnutrition of moderate degree   RECOMMENDATIONS FOR OUTPATIENT FOLLOW UP: Patient to follow-up with her oncologist for further management of her bladder cancer   Home Health: None Equipment/Devices: None  CODE STATUS: Full code  DISCHARGE CONDITION: fair  Diet recommendation: As before  INITIAL HISTORY: 71 year old female with history of hyper lipidemia hypertension anemia and metastatic bladder cancer admitted with abdominal pain CT scan of the abdomen shows diffuse thickening in the sigmoid extending towards the rectum consistent with acute colitis.  No perforation or abscess.  She also had hepatic and skeletal metastatic disease and bladder wall thickening consistent with her history of bladder cancer.  Recent PET scan done as outpatient on 11/09/2021 showed acute colitis and diverticulitis of the sigmoid and upper rectum.  Extensive bony metastasis with mixed response were seen. She lives at home with her caregiver.   HOSPITAL COURSE:   Acute colitis  Patient admitted with severe abdominal pain and decreased p.o. intake. CT of the abdomen shows acute colitis of the sigmoid and the rectal area. Patient was started on ceftriaxone and metronidazole.  Slowly started improving.  She was switched over to Augmentin.  Will be discharged on the same.  Has had bowel movement.  Tolerating her diet.   Symptomatic iron deficiency anemia She  received 2 units of packed RBC during this hospital stay with improvement in hemoglobin.  No overt bleeding noted. Hemoglobin noted to be stable.   Hypokalemia and hypomagnesemia Repleted  Metastatic bladder cancer Follow-up with her medical oncologist.  Essential hypertension Blood pressure stable.  Noted to be on diltiazem.  Hyperlipidemia Continue Lipitor.   Klebsiella UTI Sensitivities reviewed.  Being treated with antibiotics as outlined above.   Moderate protein calorie malnutrition. Nutrition Problem: Moderate Malnutrition Etiology: chronic illness, cancer and cancer related treatments Signs/Symptoms: mild fat depletion, mild muscle depletion, percent weight loss Interventions: Boost Breeze, Ensure Enlive (each supplement provides 350kcal and 20 grams of protein), MVI   Patient is stable.  Okay for discharge home today.  PERTINENT LABS:  The results of significant diagnostics from this hospitalization (including imaging, microbiology, ancillary and laboratory) are listed below for reference.     Labs:   Basic Metabolic Panel: Recent Labs  Lab 11/18/21 0433 11/19/21 0514 11/21/21 0411  NA 137 135 136  K 3.9 3.7 3.9  CL 105 104 103  CO2 '25 25 26  '$ GLUCOSE 79 77 78  BUN 7* 6* 6*  CREATININE 0.67 0.65 0.55  CALCIUM 8.2* 7.6* 7.8*  MG 1.8  --   --    Liver Function Tests: Recent Labs  Lab 11/18/21 0433 11/21/21 0411  AST 14* 15  ALT 15 12  ALKPHOS 127* 122  BILITOT 0.4 0.5  PROT 5.1* 5.1*  ALBUMIN 1.9* 2.0*    CBC: Recent Labs  Lab 11/18/21 0433 11/19/21 0514 11/20/21 0517 11/21/21 0411  WBC 11.9* 10.6* 9.5 9.2  NEUTROABS 7.9*  --   --   --  HGB 7.9* 8.0* 9.6* 9.8*  HCT 25.4* 24.8* 30.2* 30.2*  MCV 95.8 96.1 93.5 95.6  PLT 413* 363 349 328    IMAGING STUDIES DG Chest 2 View  Result Date: 11/13/2021 CLINICAL DATA:  Abdominal pain, hypoxia, history of bladder cancer EXAM: CHEST - 2 VIEW COMPARISON:  09/20/2021 FINDINGS: Lungs are  essentially clear.  No pleural effusion or pneumothorax. The heart is normal in size.  Thoracic aortic atherosclerosis. Right chest power port terminates at the cavoatrial junction. Visualized osseous structures are within normal limits. IMPRESSION: Normal chest radiographs. Electronically Signed   By: Julian Hy M.D.   On: 11/13/2021 00:28   CT Abdomen Pelvis W Contrast  Result Date: 11/12/2021 CLINICAL DATA:  Acute abdominal pain on the left EXAM: CT ABDOMEN AND PELVIS WITH CONTRAST TECHNIQUE: Multidetector CT imaging of the abdomen and pelvis was performed using the standard protocol following bolus administration of intravenous contrast. RADIATION DOSE REDUCTION: This exam was performed according to the departmental dose-optimization program which includes automated exposure control, adjustment of the mA and/or kV according to patient size and/or use of iterative reconstruction technique. CONTRAST:  168m OMNIPAQUE IOHEXOL 300 MG/ML  SOLN COMPARISON:  PET-CT from 11/09/2021 FINDINGS: Lower chest: Lungs are well aerated bilaterally. No focal infiltrate or sizable effusion is seen. Hepatobiliary: Liver demonstrates hypodensity best seen on image number 14 of series 2 similar to that seen on recent PET-CT consistent with metastatic disease. Gallbladder is within normal limits. Pancreas: Unremarkable. No pancreatic ductal dilatation or surrounding inflammatory changes. Spleen: Normal in size without focal abnormality. Adrenals/Urinary Tract: Adrenal glands are unremarkable. Kidneys demonstrate a normal enhancement pattern bilaterally. No renal calculi or obstructive changes are seen. Subcentimeter cysts are noted on the left. No follow-up is recommended. Normal excretion of contrast is noted on delayed images. The bladder is decompressed with diffuse wall thickening particularly on the left with areas of diffuse enhancement consistent with the known history of bladder carcinoma. Stomach/Bowel:  Rectosigmoid demonstrates significant wall thickening similar to that seen on the recent PET-CT most consistent with acute colitis. Scattered diverticular changes noted. The more proximal colon is within normal limits. The appendix is unremarkable. Small bowel and stomach are within normal limits. Vascular/Lymphatic: Aortic atherosclerosis. No enlarged abdominal or pelvic lymph nodes. Reproductive: Uterus and bilateral adnexa are unremarkable. Other: No free fluid is noted. Musculoskeletal: Multiple sclerotic foci are identified throughout the pelvis similar to that seen on prior exam consistent with metastatic disease. Similar changes are noted in the lumbar spine and visualized ribcage. No compression deformity is noted. IMPRESSION: Changes consistent with hepatic and skeletal metastatic disease. No acute fractures are seen. Some bladder wall thickening is noted particularly on the left consistent with the given clinical history of bladder carcinoma. Diffuse thickening in the sigmoid extending towards the rectum consistent with acute colitis stable from the recent CT. No perforation or abscess is identified. Electronically Signed   By: MInez CatalinaM.D.   On: 11/12/2021 20:19   NM PET Image Restag (PS) Skull Base To Thigh  Addendum Date: 11/12/2021   ADDENDUM REPORT: 11/12/2021 16:16 ADDENDUM: There is also increased metabolic activity in the spleen which is favored to represent systemic inflammatory response rather than tumor given absence of visible lesion and no change in splenic size. Suggest attention on follow-up. This may be related to ongoing marked inflammation of the rectosigmoid colon. Electronically Signed   By: GZetta BillsM.D.   On: 11/12/2021 16:16   Result Date: 11/12/2021 CLINICAL DATA:  Subsequent  treatment strategy for bladder cancer in a 71 year old female. EXAM: NUCLEAR MEDICINE PET SKULL BASE TO THIGH TECHNIQUE: 7.31 mCi F-18 FDG was injected intravenously. Full-ring PET imaging was  performed from the skull base to thigh after the radiotracer. CT data was obtained and used for attenuation correction and anatomic localization. Fasting blood glucose: 109 mg/dl COMPARISON:  CT angiography of the chest and PET evaluation from March of 2023. FINDINGS: Mediastinal blood pool activity: SUV max 1.80 Liver activity: SUV max NA NECK: No discrete adenopathy in the neck with hypermetabolic features. Incidental CT findings: none CHEST: No adenopathy in the chest or suspicious pulmonary nodules. Incidental CT findings: RIGHT-sided Port-A-Cath terminates at the upper portion of the RIGHT atrium at the caval to atrial junction. Calcified coronary artery disease. Aberrant RIGHT subclavian. Calcified atherosclerosis. Normal heart size without pericardial effusion. No adenopathy by size criteria in the chest. ABDOMEN/PELVIS: Large bladder mass which was present on previous imaging is no longer visible though the urinary bladder is collapsed. LEFT para-aortic lymph node (image 119/4) 6 mm as compared to 5 mm, not previously hypermetabolic now with a maximum SUV of 4.67. Signs of hepatic metastatic disease have nearly completely resolved, marked interval improvement. Area with greatest hypermetabolic activity with a maximum SUV of 5.68 previously as high as 14. Subtle low level activity only slightly greater than hepatic parenchyma may be indicative of residual uptake within lesions which are not well seen on CT, hepatic subsegment V/VIII 9 mm lesion previously as large as 21 mm. Spleen with greater FDG uptake than liver on today's exam, of uncertain significance. No visible lesion in the spleen in the spleen is stable in size. Incidental CT findings: Diffuse sigmoid thickening extending into the upper rectum with pericolonic and perirectal stranding in the setting of diverticular disease with marked increased metabolic activity. Longer segment than expected for usual diverticulitis though still potentially related  to diverticulitis. Marked increased metabolic activity associated with this finding maximum SUV 18.7. No acute findings relative liver, gallbladder, spleen, pancreas, adrenal glands, kidneys, stomach and small bowel. Pancolonic diverticulosis. Appendix not visible without secondary signs to suggest acute appendiceal process. Aortic atherosclerosis without aneurysm. No adenopathy by size criteria in the retroperitoneum. SKELETON: Diffuse skeletal metastases. Mixed response with respect to skeletal metastatic disease. CT appearance is largely similar though with increasing sclerosis of many areas. Metabolic activity is diminished in the lumbar spine and has developed in the interval at T12 for example. Maximum SUV at T12 of 5.97 also with activity at T9 the may be slightly increased. Diffuse marrow space activity in the bilateral proximal femora. Separate from areas of metastatic disease that were seen previously. Also with marrow space activity in the bilateral proximal humeri. Increased metabolic activity in the cervical spine in general, at multiple levels not present previously. Discrete metastatic focus above the RIGHT acetabulum and LEFT acetabulum in the LEFT iliac and RIGHT iliac bones show substantial improvement. Maximum SUV currently in this area of 3.81 as compared to 14.85. Increasing sclerosis of RIGHT femoral neck lesion in many of the other pelvic lesions seen on previous imaging. Incidental CT findings: None IMPRESSION: Signs of acute colitis and or marked diverticulitis of the sigmoid and upper rectum with intense surrounding stranding and increased metabolic activity. Correlation with patient symptoms and with any risk factors for infectious colitis such as recent antibiotic administration is suggested. Interval improvement with respect to disease in the liver still with residual metabolic activity at low levels in focal areas in the liver. No  hypermetabolic lymph nodes in the chest but with new  increased metabolism in the LEFT retroperitoneal lymph node. Extensive bony metastases with mixed response to therapy. There is diffuse increased metabolic activity within visualized marrow spaces, for example symmetric in the bilateral proximal femora without corresponding CT lesions, also seen in bilateral proximal humeri and in marrow spaces throughout the spine. Diffuse metastases are considered in addition to, if applicable marrow space stimulation. This more likely represents a mixed picture of marrow space stimulation of present and worsening of metastatic disease. In the absence of marrow space stimulation this represents substantial worsening of metastatic disease. Increasingly sclerotic appearance of many of the skeletal lesions may reflect some bony healing. Diminished activity most notably in the lumbar spine may relate to interval radiotherapy. These results were called by telephone at the time of interpretation on 11/12/2021 at 4:07 pm to provider [facility] , who verbally acknowledged these results. Electronically Signed: By: Zetta Bills M.D. On: 11/12/2021 16:07    DISCHARGE EXAMINATION: Vitals:   11/22/21 1452 11/22/21 1702 11/22/21 2142 11/23/21 0635  BP: (!) 137/91 137/90 (!) 146/83 (!) 144/83  Pulse: 96  96 87  Resp:   18 18  Temp: (!) 97.5 F (36.4 C)  (!) 97.3 F (36.3 C) 97.8 F (36.6 C)  TempSrc: Oral  Oral Oral  SpO2: 96%  96% 93%  Weight:      Height:       General appearance: Awake alert.  In no distress Resp: Clear to auscultation bilaterally.  Normal effort Cardio: S1-S2 is normal regular.  No S3-S4.  No rubs murmurs or bruit GI: Abdomen is soft.  Nontender nondistended.  Bowel sounds are present normal.  No masses organomegaly Extremities: No edema.  Full range of motion of lower extremities. Neurologic: Alert and oriented x3.  No focal neurological deficits.    DISPOSITION: Home  Discharge Instructions     Call MD for:  difficulty breathing, headache  or visual disturbances   Complete by: As directed    Call MD for:  extreme fatigue   Complete by: As directed    Call MD for:  persistant dizziness or light-headedness   Complete by: As directed    Call MD for:  persistant nausea and vomiting   Complete by: As directed    Call MD for:  severe uncontrolled pain   Complete by: As directed    Call MD for:  temperature >100.4   Complete by: As directed    Diet - low sodium heart healthy   Complete by: As directed    Discharge instructions   Complete by: As directed    Please take your medications as prescribed.  Take your stool softeners and laxatives to ensure that you have a bowel movement every day.  Follow-up with your oncologist as per his instructions. Seek attention if symptoms recur or get worse.  You were cared for by a hospitalist during your hospital stay. If you have any questions about your discharge medications or the care you received while you were in the hospital after you are discharged, you can call the unit and asked to speak with the hospitalist on call if the hospitalist that took care of you is not available. Once you are discharged, your primary care physician will handle any further medical issues. Please note that NO REFILLS for any discharge medications will be authorized once you are discharged, as it is imperative that you return to your primary care physician (or establish a  relationship with a primary care physician if you do not have one) for your aftercare needs so that they can reassess your need for medications and monitor your lab values. If you do not have a primary care physician, you can call [phone] for a physician referral.   Increase activity slowly   Complete by: As directed           Allergies as of 11/23/2021   No Known Allergies      Medication List     STOP taking these medications    amLODipine 5 MG tablet Commonly known as: NORVASC   lidocaine-prilocaine cream Commonly known as:  EMLA       TAKE these medications    amoxicillin-clavulanate 875-125 MG tablet Commonly known as: AUGMENTIN Take 1 tablet by mouth every 12 (twelve) hours for 3 days.   atorvastatin 10 MG tablet Commonly known as: LIPITOR Take 10 mg by mouth every evening.   celecoxib 200 MG capsule Commonly known as: CeleBREX Take 1 capsule (200 mg total) by mouth 2 (two) times daily.   dexamethasone 4 MG tablet Commonly known as: DECADRON Take 2 tablets (8 mg total) by mouth daily. Take daily x 3 days starting the day after cisplatin chemotherapy. Take with food.   diltiazem 30 MG tablet Commonly known as: CARDIZEM Take 2 tablets (60 mg total) by mouth 3 (three) times daily.   famotidine 40 MG tablet Commonly known as: PEPCID Take 1 tablet (40 mg total) by mouth 2 (two) times daily.   fentaNYL 25 MCG/HR Commonly known as: Gruver 1 patch onto the skin every 3 (three) days.   gabapentin 300 MG capsule Commonly known as: NEURONTIN Take 1 capsule (300 mg total) by mouth 3 (three) times daily.   Gas Relief 250 MG Caps Generic drug: Simethicone Take 1 capsule by mouth daily as needed (for flatulence).   Klor-Con M20 20 MEQ tablet Generic drug: potassium chloride SA TAKE 1 TABLET BY MOUTH EVERY DAY   losartan 100 MG tablet Commonly known as: COZAAR Take 100 mg by mouth daily at 6 (six) AM.   multivitamin with minerals tablet Take 1 tablet by mouth daily.   ondansetron 8 MG tablet Commonly known as: Zofran Take 1 tablet (8 mg total) by mouth 2 (two) times daily as needed. Start on the third day after cisplatin chemotherapy. What changed: reasons to take this   oxyCODONE-acetaminophen 5-325 MG tablet Commonly known as: PERCOCET/ROXICET Take 1-2 tablets by mouth every 4 (four) hours as needed for severe pain.   polyethylene glycol 17 g packet Commonly known as: MIRALAX / GLYCOLAX Take 17 g by mouth daily as needed for mild constipation.   prochlorperazine 10 MG  tablet Commonly known as: COMPAZINE Take 1 tablet (10 mg total) by mouth every 6 (six) hours as needed (Nausea or vomiting).   senna-docusate 8.6-50 MG tablet Commonly known as: Senokot-S Take 2 tablets by mouth 2 (two) times daily.          Follow-up Information     Glenis Smoker, MD. Schedule an appointment as soon as possible for a visit in 1 week(s).   Specialty: Family Medicine Contact information: [place] [address] [phone]                 TOTAL DISCHARGE TIME: 62 minutes  [place]  [facility] Pager on [email or web address]  11/24/2021, 10:21 AM

## 2021-11-27 ENCOUNTER — Encounter: Payer: Self-pay | Admitting: *Deleted

## 2021-11-27 NOTE — Progress Notes (Signed)
Patient has been discharged from the hospital. She will need follow up in this office in about 2 weeks. Message sent to scheduling.   Oncology Nurse Navigator Documentation     11/27/2021    7:30 AM  Oncology Nurse Navigator Flowsheets  Navigator Follow Up Date: 12/11/2021  Navigator Follow Up Reason: Follow-up Appointment;Chemotherapy  Navigator Location [facility]  Navigator Encounter Type Appt/Treatment Plan Review  Patient Visit Type MedOnc  Treatment Phase Active Tx  Barriers/Navigation Needs Coordination of Care;Education  Interventions Coordination of Care  Acuity Level 2-Minimal Needs (1-2 Barriers Identified)  Coordination of Care Appts  Support Groups/Services Friends and Family  Time Spent with Patient 15

## 2021-11-30 ENCOUNTER — Other Ambulatory Visit: Payer: Self-pay | Admitting: *Deleted

## 2021-11-30 DIAGNOSIS — C679 Malignant neoplasm of bladder, unspecified: Secondary | ICD-10-CM

## 2021-11-30 MED ORDER — PROCHLORPERAZINE MALEATE 10 MG PO TABS: 10.0000 mg | ORAL_TABLET | Freq: Four times a day (QID) | ORAL | 1 refills | Status: DC | PRN

## 2021-11-30 MED ORDER — OXYCODONE-ACETAMINOPHEN 5-325 MG PO TABS: 1.0000 | ORAL_TABLET | ORAL | 0 refills | Status: DC | PRN

## 2021-11-30 MED ORDER — XTAMPZA ER 9 MG PO C12A: 9.0000 mg | EXTENDED_RELEASE_CAPSULE | Freq: Two times a day (BID) | ORAL | 0 refills | Status: DC

## 2021-12-11 ENCOUNTER — Encounter: Payer: Self-pay | Admitting: *Deleted

## 2021-12-11 ENCOUNTER — Encounter: Payer: Self-pay | Admitting: Hematology & Oncology

## 2021-12-11 ENCOUNTER — Inpatient Hospital Stay: Payer: Medicare PPO

## 2021-12-11 ENCOUNTER — Inpatient Hospital Stay (HOSPITAL_BASED_OUTPATIENT_CLINIC_OR_DEPARTMENT_OTHER): Payer: Medicare PPO | Admitting: Hematology & Oncology

## 2021-12-11 ENCOUNTER — Inpatient Hospital Stay: Payer: Medicare PPO | Attending: Family Medicine

## 2021-12-11 ENCOUNTER — Other Ambulatory Visit: Payer: Self-pay | Admitting: Lab

## 2021-12-11 VITALS — BP 110/69 | HR 94 | Temp 98.4°F | Resp 18 | Wt 139.2 lb

## 2021-12-11 DIAGNOSIS — C7951 Secondary malignant neoplasm of bone: Secondary | ICD-10-CM | POA: Insufficient documentation

## 2021-12-11 DIAGNOSIS — C787 Secondary malignant neoplasm of liver and intrahepatic bile duct: Secondary | ICD-10-CM | POA: Diagnosis not present

## 2021-12-11 DIAGNOSIS — C679 Malignant neoplasm of bladder, unspecified: Secondary | ICD-10-CM

## 2021-12-11 DIAGNOSIS — D509 Iron deficiency anemia, unspecified: Secondary | ICD-10-CM | POA: Diagnosis not present

## 2021-12-11 DIAGNOSIS — Z5111 Encounter for antineoplastic chemotherapy: Secondary | ICD-10-CM | POA: Insufficient documentation

## 2021-12-11 LAB — CMP (CANCER CENTER ONLY)
ALT: 8 U/L (ref 0–44)
AST: 13 U/L — ABNORMAL LOW (ref 15–41)
Albumin: 2.7 g/dL — ABNORMAL LOW (ref 3.5–5.0)
Alkaline Phosphatase: 165 U/L — ABNORMAL HIGH (ref 38–126)
Anion gap: 8 (ref 5–15)
BUN: 16 mg/dL (ref 8–23)
CO2: 28 mmol/L (ref 22–32)
Calcium: 8.6 mg/dL — ABNORMAL LOW (ref 8.9–10.3)
Chloride: 100 mmol/L (ref 98–111)
Creatinine: 0.87 mg/dL (ref 0.44–1.00)
GFR, Estimated: >60 mL/min (ref >60)
Glucose, Bld: 102 mg/dL — ABNORMAL HIGH (ref 70–99)
Potassium: 3.9 mmol/L (ref 3.5–5.1)
Sodium: 136 mmol/L (ref 135–145)
Total Bilirubin: 0.4 mg/dL (ref 0.3–1.2)
Total Protein: 5.5 g/dL — ABNORMAL LOW (ref 6.5–8.1)

## 2021-12-11 LAB — CBC WITH DIFFERENTIAL (CANCER CENTER ONLY)
Abs Immature Granulocytes: 0.02 10*3/uL (ref 0.00–0.07)
Basophils Absolute: 0.1 10*3/uL (ref 0.0–0.1)
Basophils Relative: 1 %
Eosinophils Absolute: 0.3 10*3/uL (ref 0.0–0.5)
Eosinophils Relative: 4 %
HCT: 32.2 % — ABNORMAL LOW (ref 36.0–46.0)
Hemoglobin: 10.1 g/dL — ABNORMAL LOW (ref 12.0–15.0)
Immature Granulocytes: 0 %
Lymphocytes Relative: 12 %
Lymphs Abs: 1 10*3/uL (ref 0.7–4.0)
MCH: 31 pg (ref 26.0–34.0)
MCHC: 31.4 g/dL (ref 30.0–36.0)
MCV: 98.8 fL (ref 80.0–100.0)
Monocytes Absolute: 1.3 10*3/uL — ABNORMAL HIGH (ref 0.1–1.0)
Monocytes Relative: 16 %
Neutro Abs: 5.4 10*3/uL (ref 1.7–7.7)
Neutrophils Relative %: 67 %
Platelet Count: 337 10*3/uL (ref 150–400)
RBC: 3.26 MIL/uL — ABNORMAL LOW (ref 3.87–5.11)
RDW: 20.3 % — ABNORMAL HIGH (ref 11.5–15.5)
WBC Count: 8.1 10*3/uL (ref 4.0–10.5)
nRBC: 0 % (ref 0.0–0.2)

## 2021-12-11 LAB — FERRITIN: Ferritin: 1282 ng/mL — ABNORMAL HIGH (ref 11–307)

## 2021-12-11 LAB — LACTATE DEHYDROGENASE: LDH: 132 U/L (ref 98–192)

## 2021-12-11 MED ORDER — SODIUM CHLORIDE 0.9 % IV SOLN: Freq: Once | INTRAVENOUS | Status: AC

## 2021-12-11 MED ORDER — PALONOSETRON HCL INJECTION 0.25 MG/5ML
0.2500 mg | Freq: Once | INTRAVENOUS | Status: AC
  Administered 2021-12-11: 0.25 mg via INTRAVENOUS
  Filled 2021-12-11: qty 5

## 2021-12-11 MED ORDER — SODIUM CHLORIDE 0.9 % IV SOLN
10.0000 mg | Freq: Once | INTRAVENOUS | Status: AC
  Administered 2021-12-11: 10 mg via INTRAVENOUS
  Filled 2021-12-11: qty 10

## 2021-12-11 MED ORDER — SODIUM CHLORIDE 0.9% FLUSH
10.0000 mL | INTRAVENOUS | Status: DC | PRN
  Administered 2021-12-11: 10 mL

## 2021-12-11 MED ORDER — MAGNESIUM SULFATE 2 GM/50ML IV SOLN
2.0000 g | Freq: Once | INTRAVENOUS | Status: AC
  Administered 2021-12-11: 2 g via INTRAVENOUS
  Filled 2021-12-11: qty 50

## 2021-12-11 MED ORDER — SODIUM CHLORIDE 0.9 % IV SOLN
720.0000 mg/m2 | Freq: Once | INTRAVENOUS | Status: AC
  Administered 2021-12-11: 1216 mg via INTRAVENOUS
  Filled 2021-12-11: qty 26.3

## 2021-12-11 MED ORDER — HEPARIN SOD (PORK) LOCK FLUSH 100 UNIT/ML IV SOLN
500.0000 [IU] | Freq: Once | INTRAVENOUS | Status: AC | PRN
  Administered 2021-12-11: 500 [IU]

## 2021-12-11 MED ORDER — LORAZEPAM 2 MG/ML IJ SOLN
0.5000 mg | Freq: Once | INTRAMUSCULAR | Status: AC
  Administered 2021-12-11: 0.5 mg via INTRAVENOUS
  Filled 2021-12-11: qty 1

## 2021-12-11 MED ORDER — SODIUM CHLORIDE 0.9 % IV SOLN
150.0000 mg | Freq: Once | INTRAVENOUS | Status: AC
  Administered 2021-12-11: 150 mg via INTRAVENOUS
  Filled 2021-12-11: qty 150

## 2021-12-11 MED ORDER — ZOLEDRONIC ACID 4 MG/100ML IV SOLN
4.0000 mg | Freq: Once | INTRAVENOUS | Status: AC
  Administered 2021-12-11: 4 mg via INTRAVENOUS
  Filled 2021-12-11: qty 100

## 2021-12-11 MED ORDER — POTASSIUM CHLORIDE IN NACL 20-0.9 MEQ/L-% IV SOLN
Freq: Once | INTRAVENOUS | Status: AC
  Filled 2021-12-11: qty 1000

## 2021-12-11 MED ORDER — SODIUM CHLORIDE 0.9 % IV SOLN
50.4000 mg/m2 | Freq: Once | INTRAVENOUS | Status: AC
  Administered 2021-12-11: 85 mg via INTRAVENOUS
  Filled 2021-12-11: qty 85

## 2021-12-11 NOTE — Progress Notes (Signed)
Patient is feeling better after her recent hospitalization. She still has some abdominal discomfort but this is much improved.   Oncology Nurse Navigator Documentation     12/11/2021   10:45 AM  Oncology Nurse Navigator Flowsheets  Navigator Follow Up Date: 01/02/2022  Navigator Follow Up Reason: Follow-up Appointment;Chemotherapy  Navigator Location [facility]  Navigator Encounter Type Treatment;Appt/Treatment Plan Review  Patient Visit Type MedOnc  Treatment Phase Active Tx  Barriers/Navigation Needs Coordination of Care;Education  Interventions Psycho-Social Support  Acuity Level 2-Minimal Needs (1-2 Barriers Identified)  Support Groups/Services Friends and Family  Time Spent with Patient 15

## 2021-12-11 NOTE — Addendum Note (Signed)
Addended by: Rico Ala on: 12/11/2021 01:33 PM   Modules accepted: Orders

## 2021-12-11 NOTE — Progress Notes (Signed)
Ok to give Cisplatin today with urine output of only 100 cc per Dr Marin Olp.

## 2021-12-11 NOTE — Progress Notes (Unsigned)
Hematology and Oncology Follow Up Visit  Cheryl Singh [id number] 02-Aug-1950 71 y.o. 12/11/2021   Principle Diagnosis:  Metastatic bladder cancer-liver and bone metastasis Iron deficiency anemia  Current Therapy:   Status post cycle #3 of chemotherapy with cisplatin/gemcitabine Status post radiation therapy to the spine Zometa 4 mg IV q. 3 months-next dose on 12/18/2021 IV iron-Feraheme given on 09/17/2021     Interim History:  Cheryl Singh is back for follow-up.  She was actually hospitalized back in May.  She was there for over a week.  She came in with what looks like colitis.  This seemed to be the cause of her abdominal pain.  The colitis was seen initially on a PET scan.  She had a CT scan which showed the acute colitis in the sigmoid and rectal portions of the large intestine.  She was placed on antibiotics.  She began to improve.  I think we probably did transfuse her in the hospital.  She also had a Klebsiella urinary tract infection.  We did do a PET scan on her after her third cycle of treatment.  The PET scan seem to show that she was responding.  She really wants to continue on chemotherapy.  We will go ahead and make some dosage adjustments for her.  She is not hurting as much with the bony issues.  I know she has a lot of bony metastasis.  We have given her Zometa in the past.  She will be due for this month.  She is lost some weight.  She still is trying to recover from this hospitalization with colitis.  She has had no cough.  There is been no shortness of breath.  She does have anxiety over her situation.  I totally understand this.  She has had no problems with bleeding.  There is been no leg swelling.  Currently, I would have to say that her performance status is probably ECOG 2.    Medications:  Current Outpatient Medications:    atorvastatin (LIPITOR) 10 MG tablet, Take 10 mg by mouth every evening., Disp: , Rfl:    celecoxib (CELEBREX) 200 MG  capsule, Take 1 capsule (200 mg total) by mouth 2 (two) times daily., Disp: 60 capsule, Rfl: 4   dexamethasone (DECADRON) 4 MG tablet, Take 2 tablets (8 mg total) by mouth daily. Take daily x 3 days starting the day after cisplatin chemotherapy. Take with food., Disp: 30 tablet, Rfl: 1   diltiazem (CARDIZEM) 30 MG tablet, Take 2 tablets (60 mg total) by mouth 3 (three) times daily., Disp: 180 tablet, Rfl: 2   famotidine (PEPCID) 40 MG tablet, Take 1 tablet (40 mg total) by mouth 2 (two) times daily., Disp: 60 tablet, Rfl: 4   KLOR-CON M20 20 MEQ tablet, TAKE 1 TABLET BY MOUTH EVERY DAY, Disp: 30 tablet, Rfl: 1   Multiple Vitamins-Minerals (MULTIVITAMIN WITH MINERALS) tablet, Take 1 tablet by mouth daily., Disp: , Rfl:    oxyCODONE ER (XTAMPZA ER) 9 MG C12A, Take 9 mg by mouth every 12 (twelve) hours., Disp: 60 capsule, Rfl: 0   oxyCODONE-acetaminophen (PERCOCET/ROXICET) 5-325 MG tablet, Take 1-2 tablets by mouth every 4 (four) hours as needed for severe pain., Disp: 60 tablet, Rfl: 0   polyethylene glycol (MIRALAX / GLYCOLAX) 17 g packet, Take 17 g by mouth daily as needed for mild constipation., Disp: , Rfl:    prochlorperazine (COMPAZINE) 10 MG tablet, Take 1 tablet (10 mg total) by mouth every 6 (six) hours as  needed (Nausea or vomiting)., Disp: 30 tablet, Rfl: 1   senna-docusate (SENOKOT-S) 8.6-50 MG tablet, Take 2 tablets by mouth 2 (two) times daily., Disp: 60 tablet, Rfl: 1   Simethicone (GAS RELIEF) 250 MG CAPS, Take 1 capsule by mouth daily as needed (for flatulence)., Disp: , Rfl:    gabapentin (NEURONTIN) 300 MG capsule, Take 1 capsule (300 mg total) by mouth 3 (three) times daily. (Patient not taking: Reported on 11/12/2021), Disp: 90 capsule, Rfl: 1   losartan (COZAAR) 100 MG tablet, Take 100 mg by mouth daily at 6 (six) AM. (Patient not taking: Reported on 11/01/2021), Disp: , Rfl:    ondansetron (ZOFRAN) 8 MG tablet, Take 1 tablet (8 mg total) by mouth 2 (two) times daily as needed. Start  on the third day after cisplatin chemotherapy. (Patient not taking: Reported on 12/11/2021), Disp: 30 tablet, Rfl: 1  Allergies: No Known Allergies  Past Medical History, Surgical history, Social history, and Family History were reviewed and updated.  Review of Systems: Review of Systems  Constitutional:  Positive for fatigue.  HENT:  Negative.    Eyes: Negative.   Respiratory: Negative.    Cardiovascular:  Positive for palpitations.  Gastrointestinal:  Positive for nausea.  Endocrine: Negative.   Genitourinary: Negative.    Musculoskeletal:  Positive for back pain.  Neurological: Negative.   Hematological: Negative.   Psychiatric/Behavioral: Negative.      Physical Exam:  weight is 139 lb 4 oz (63.2 kg). Her oral temperature is 98.4 F (36.9 C). Her blood pressure is 110/69 and her pulse is 94. Her respiration is 18 and oxygen saturation is 100%.   Wt Readings from Last 3 Encounters:  12/11/21 139 lb 4 oz (63.2 kg)  12/11/21 139 lb 6.4 oz (63.2 kg)  11/13/21 151 lb 10.8 oz (68.8 kg)    Physical Exam Vitals reviewed.  HENT:     Head: Normocephalic and atraumatic.  Eyes:     Pupils: Pupils are equal, round, and reactive to light.  Cardiovascular:     Rate and Rhythm: Normal rate and regular rhythm.     Heart sounds: Normal heart sounds.  Pulmonary:     Effort: Pulmonary effort is normal.     Breath sounds: Normal breath sounds.  Abdominal:     General: Bowel sounds are normal.     Palpations: Abdomen is soft.  Musculoskeletal:        General: No tenderness or deformity. Normal range of motion.     Cervical back: Normal range of motion.  Lymphadenopathy:     Cervical: No cervical adenopathy.  Skin:    General: Skin is warm and dry.     Findings: No erythema or rash.  Neurological:     Mental Status: She is alert and oriented to person, place, and time.  Psychiatric:        Behavior: Behavior normal.        Thought Content: Thought content normal.         Judgment: Judgment normal.      Lab Results  Component Value Date   WBC 8.1 12/11/2021   HGB 10.1 (L) 12/11/2021   HCT 32.2 (L) 12/11/2021   MCV 98.8 12/11/2021   PLT 337 12/11/2021     Chemistry      Component Value Date/Time   NA 136 12/11/2021 0913   K 3.9 12/11/2021 0913   CL 100 12/11/2021 0913   CO2 28 12/11/2021 0913   BUN 16 12/11/2021 0913  CREATININE 0.87 12/11/2021 0913      Component Value Date/Time   CALCIUM 8.6 (L) 12/11/2021 0913   ALKPHOS 165 (H) 12/11/2021 0913   AST 13 (L) 12/11/2021 0913   ALT 8 12/11/2021 0913   BILITOT 0.4 12/11/2021 0913      Impression and Plan: Cheryl Singh is a very charming 71 year old white female.  She has metastatic bladder cancer.  So far, she has had 3 cycles of chemotherapy.  We have had to make some dosage adjustments.  She has had blood transfusions.  She was hospitalized with a colitis.  I do not think that the colitis was secondary to the chemotherapy.  She is not on any immunotherapy.  We will try to go ahead and treat today.  I will try to give her 2 more cycles of treatment and then see how everything looks.  At that time, that might be the most that we could probably give her.  I would then see about switching over to immunotherapy.  I does want her quality of life to be as good as possible.  I know she is trying incredibly hard.  She is really a tough woman.  She has a lot of motivation.  She has a lot of good support at home.  We will plan to get her back to see [place] in another 3-4 weeks.   Volanda Napoleon, MD 6/13/202310:12 AM

## 2021-12-11 NOTE — Progress Notes (Signed)
Dr. Marin Olp has decreased doses of gemcitabine and cisplatin today. Patient has had weight loss. Further decreasing current dose/m2 of both medications to match patient's weight.

## 2021-12-11 NOTE — Patient Instructions (Addendum)
[facility] [place]  Discharge Instructions: Thank you for choosing [facility] to provide your oncology and hematology care.   If you have a lab appointment with the [facility], please go directly to the [facility] and check in at the registration area.  Wear comfortable clothing and clothing appropriate for easy access to any Portacath or PICC line.   We strive to give you quality time with your provider. You may need to reschedule your appointment if you arrive late (15 or more minutes).  Arriving late affects you and other patients whose appointments are after yours.  Also, if you miss three or more appointments without notifying the office, you may be dismissed from the clinic at the provider's discretion.      For prescription refill requests, have your pharmacy contact our office and allow 72 hours for refills to be completed.    Today you received the following chemotherapy and/or immunotherapy agents Cisplatin, Gemzar      To help prevent nausea and vomiting after your treatment, we encourage you to take your nausea medication as directed.  BELOW ARE SYMPTOMS THAT SHOULD BE REPORTED IMMEDIATELY: *FEVER GREATER THAN 100.4 F (38 C) OR HIGHER *CHILLS OR SWEATING *NAUSEA AND VOMITING THAT IS NOT CONTROLLED WITH YOUR NAUSEA MEDICATION *UNUSUAL SHORTNESS OF BREATH *UNUSUAL BRUISING OR BLEEDING *URINARY PROBLEMS (pain or burning when urinating, or frequent urination) *BOWEL PROBLEMS (unusual diarrhea, constipation, pain near the anus) TENDERNESS IN MOUTH AND THROAT WITH OR WITHOUT PRESENCE OF ULCERS (sore throat, sores in mouth, or a toothache) UNUSUAL RASH, SWELLING OR PAIN  UNUSUAL VAGINAL DISCHARGE OR ITCHING   Items with * indicate a potential emergency and should be followed up as soon as possible or go to the Emergency Department if any problems should occur.  Please show the CHEMOTHERAPY ALERT CARD or IMMUNOTHERAPY ALERT CARD at  check-in to the Emergency Department and triage nurse. Should you have questions after your visit or need to cancel or reschedule your appointment, please contact [facility]  [phone] and follow the prompts.  Office hours are 8:00 a.m. to 4:30 p.m. Monday - Friday. Please note that voicemails left after 4:00 p.m. may not be returned until the following business day.  We are closed weekends and major holidays. You have access to a nurse at all times for urgent questions. Please call the main number to the clinic [phone] and follow the prompts.  For any non-urgent questions, you may also contact your provider using MyChart. We now offer e-Visits for anyone 68 and older to request care online for non-urgent symptoms. For details visit [email or web address].   Also download the MyChart app! Go to the app store, search "MyChart", open the app, select [facility], and log in with your MyChart username and password.  Due to Covid, a mask is required upon entering the hospital/clinic. If you do not have a mask, one will be given to you upon arrival. For doctor visits, patients may have 1 support person aged 52 or older with them. For treatment visits, patients cannot have anyone with them due to current Covid guidelines and our immunocompromised population. Implanted Port Insertion, Care After The following information offers guidance on how to care for yourself after your procedure. Your health care provider may also give you more specific instructions. If you have problems or questions, contact your health care provider. What can I expect after the procedure? After the procedure, it  is common to have: Discomfort at the port insertion site. Bruising on the skin over the port. This should improve over 3-4 days. Follow these instructions at home: [facility] care After your port is placed, you will get a manufacturer's information card. The card has information about your port.  Keep this card with you at all times. Take care of the port as told by your health care provider. Ask your health care provider if you or a family member can get training for taking care of the port at home. A home health care nurse will be be available to help care for the port. Make sure to remember what type of port you have. [facility] care     Follow instructions from your health care provider about how to take care of your port insertion site. Make sure you: Wash your hands with soap and water for at least 20 seconds before and after you change your bandage (dressing). If soap and water are not available, use hand sanitizer. Change your dressing as told by your health care provider. Leave stitches (sutures), skin glue, or adhesive strips in place. These skin closures may need to stay in place for 2 weeks or longer. If adhesive strip edges start to loosen and curl up, you may trim the loose edges. Do not remove adhesive strips completely unless your health care provider tells you to do that. Check your port insertion site every day for signs of infection. Check for: Redness, swelling, or pain. Fluid or blood. Warmth. Pus or a bad smell. Activity Return to your normal activities as told by your health care provider. Ask your health care provider what activities are safe for you. You may have to avoid lifting. Ask your health care provider how much you can safely lift. General instructions Take over-the-counter and prescription medicines only as told by your health care provider. Do not take baths, swim, or use a hot tub until your health care provider approves. Ask your health care provider if you may take showers. You may only be allowed to take sponge baths. If you were given a sedative during the procedure, it can affect you for several hours. Do not drive or operate machinery until your health care provider says that it is safe. Wear a medical alert bracelet in case of an emergency.  This will tell any health care providers that you have a port. Keep all follow-up visits. This is important. Contact a health care provider if: You cannot flush your port with saline as directed, or you cannot draw blood from the port. You have a fever or chills. You have redness, swelling, or pain around your port insertion site. You have fluid or blood coming from your port insertion site. Your port insertion site feels warm to the touch. You have pus or a bad smell coming from the port insertion site. Get help right away if: You have chest pain or shortness of breath. You have bleeding from your port that you cannot control. These symptoms may be an emergency. Get help right away. Call 911. Do not wait to see if the symptoms will go away. Do not drive yourself to the hospital. Summary Take care of the port as told by your health care provider. Keep the manufacturer's information card with you at all times. Change your dressing as told by your health care provider. Contact a health care provider if you have a fever or chills or if you have redness, swelling, or pain around your port  insertion site. Keep all follow-up visits. This information is not intended to replace advice given to you by your health care provider. Make sure you discuss any questions you have with your health care provider. Document Revised: 12/19/2020 Document Reviewed: 12/19/2020 Elsevier Patient Education  [place].

## 2021-12-11 NOTE — Progress Notes (Signed)
Ok to treat with todays labs per dr Marin Olp

## 2021-12-12 ENCOUNTER — Other Ambulatory Visit: Payer: Self-pay

## 2021-12-12 ENCOUNTER — Encounter: Payer: Self-pay | Admitting: Hematology & Oncology

## 2021-12-12 DIAGNOSIS — C679 Malignant neoplasm of bladder, unspecified: Secondary | ICD-10-CM

## 2021-12-12 DIAGNOSIS — C7951 Secondary malignant neoplasm of bone: Secondary | ICD-10-CM

## 2021-12-12 MED ORDER — DEXAMETHASONE 4 MG PO TABS: 8.0000 mg | ORAL_TABLET | Freq: Every day | ORAL | 1 refills | Status: DC

## 2021-12-13 ENCOUNTER — Encounter: Payer: Self-pay | Admitting: Hematology & Oncology

## 2021-12-14 ENCOUNTER — Other Ambulatory Visit: Payer: Medicare PPO

## 2021-12-18 ENCOUNTER — Inpatient Hospital Stay: Payer: Medicare PPO

## 2021-12-18 ENCOUNTER — Other Ambulatory Visit: Payer: Self-pay | Admitting: Oncology

## 2021-12-18 VITALS — BP 97/70 | HR 91 | Resp 18

## 2021-12-18 DIAGNOSIS — C7951 Secondary malignant neoplasm of bone: Secondary | ICD-10-CM | POA: Diagnosis not present

## 2021-12-18 DIAGNOSIS — D509 Iron deficiency anemia, unspecified: Secondary | ICD-10-CM | POA: Diagnosis not present

## 2021-12-18 DIAGNOSIS — C679 Malignant neoplasm of bladder, unspecified: Secondary | ICD-10-CM | POA: Diagnosis not present

## 2021-12-18 DIAGNOSIS — Z5111 Encounter for antineoplastic chemotherapy: Secondary | ICD-10-CM | POA: Diagnosis not present

## 2021-12-18 DIAGNOSIS — C787 Secondary malignant neoplasm of liver and intrahepatic bile duct: Secondary | ICD-10-CM | POA: Diagnosis not present

## 2021-12-18 LAB — CMP (CANCER CENTER ONLY)
ALT: 33 U/L (ref 0–44)
AST: 19 U/L (ref 15–41)
Albumin: 3 g/dL — ABNORMAL LOW (ref 3.5–5.0)
Alkaline Phosphatase: 126 U/L (ref 38–126)
Anion gap: 8 (ref 5–15)
BUN: 25 mg/dL — ABNORMAL HIGH (ref 8–23)
CO2: 29 mmol/L (ref 22–32)
Calcium: 8.4 mg/dL — ABNORMAL LOW (ref 8.9–10.3)
Chloride: 95 mmol/L — ABNORMAL LOW (ref 98–111)
Creatinine: 1.1 mg/dL — ABNORMAL HIGH (ref 0.44–1.00)
GFR, Estimated: 54 mL/min — ABNORMAL LOW (ref >60)
Glucose, Bld: 148 mg/dL — ABNORMAL HIGH (ref 70–99)
Potassium: 3.3 mmol/L — ABNORMAL LOW (ref 3.5–5.1)
Sodium: 132 mmol/L — ABNORMAL LOW (ref 135–145)
Total Bilirubin: 0.4 mg/dL (ref 0.3–1.2)
Total Protein: 5.6 g/dL — ABNORMAL LOW (ref 6.5–8.1)

## 2021-12-18 LAB — CBC WITH DIFFERENTIAL (CANCER CENTER ONLY)
Abs Immature Granulocytes: 0.03 10*3/uL (ref 0.00–0.07)
Basophils Absolute: 0 10*3/uL (ref 0.0–0.1)
Basophils Relative: 0 %
Eosinophils Absolute: 0.1 10*3/uL (ref 0.0–0.5)
Eosinophils Relative: 1 %
HCT: 31.1 % — ABNORMAL LOW (ref 36.0–46.0)
Hemoglobin: 10.3 g/dL — ABNORMAL LOW (ref 12.0–15.0)
Immature Granulocytes: 1 %
Lymphocytes Relative: 7 %
Lymphs Abs: 0.3 10*3/uL — ABNORMAL LOW (ref 0.7–4.0)
MCH: 31.9 pg (ref 26.0–34.0)
MCHC: 33.1 g/dL (ref 30.0–36.0)
MCV: 96.3 fL (ref 80.0–100.0)
Monocytes Absolute: 0.1 10*3/uL (ref 0.1–1.0)
Monocytes Relative: 2 %
Neutro Abs: 4.5 10*3/uL (ref 1.7–7.7)
Neutrophils Relative %: 89 %
Platelet Count: 158 10*3/uL (ref 150–400)
RBC: 3.23 MIL/uL — ABNORMAL LOW (ref 3.87–5.11)
RDW: 19 % — ABNORMAL HIGH (ref 11.5–15.5)
WBC Count: 5 10*3/uL (ref 4.0–10.5)
nRBC: 0 % (ref 0.0–0.2)

## 2021-12-18 LAB — SAMPLE TO BLOOD BANK

## 2021-12-18 MED ORDER — HEPARIN SOD (PORK) LOCK FLUSH 100 UNIT/ML IV SOLN
500.0000 [IU] | Freq: Once | INTRAVENOUS | Status: AC | PRN
  Administered 2021-12-18: 500 [IU]

## 2021-12-18 MED ORDER — SODIUM CHLORIDE 0.9 % IV SOLN: INTRAVENOUS | Status: DC

## 2021-12-18 MED ORDER — SODIUM CHLORIDE 0.9 % IV SOLN: Freq: Once | INTRAVENOUS | Status: AC

## 2021-12-18 MED ORDER — PROCHLORPERAZINE MALEATE 10 MG PO TABS
10.0000 mg | ORAL_TABLET | Freq: Once | ORAL | Status: AC
  Administered 2021-12-18: 10 mg via ORAL
  Filled 2021-12-18: qty 1

## 2021-12-18 MED ORDER — SODIUM CHLORIDE 0.9% FLUSH
10.0000 mL | INTRAVENOUS | Status: DC | PRN
  Administered 2021-12-18: 10 mL

## 2021-12-18 MED ORDER — SODIUM CHLORIDE 0.9 % IV SOLN
720.0000 mg/m2 | Freq: Once | INTRAVENOUS | Status: AC
  Administered 2021-12-18: 1216 mg via INTRAVENOUS
  Filled 2021-12-18: qty 26.72

## 2021-12-18 MED ORDER — SODIUM CHLORIDE 0.9 % IV SOLN: 8.0000 mg | Freq: Once | INTRAVENOUS | Status: DC

## 2021-12-18 MED ORDER — ONDANSETRON HCL 4 MG/2ML IJ SOLN
8.0000 mg | Freq: Once | INTRAMUSCULAR | Status: AC
  Administered 2021-12-18: 8 mg via INTRAVENOUS
  Filled 2021-12-18: qty 4

## 2021-12-18 MED ORDER — LORAZEPAM 2 MG/ML IJ SOLN
0.5000 mg | Freq: Once | INTRAMUSCULAR | Status: AC
  Administered 2021-12-18: 0.5 mg via INTRAVENOUS
  Filled 2021-12-18: qty 1

## 2021-12-18 MED ORDER — POTASSIUM CHLORIDE CRYS ER 20 MEQ PO TBCR
40.0000 meq | EXTENDED_RELEASE_TABLET | Freq: Once | ORAL | Status: AC
  Administered 2021-12-18: 40 meq via ORAL
  Filled 2021-12-18: qty 2

## 2021-12-18 NOTE — Progress Notes (Signed)
Dr. Marin Olp notified of pt.'s complaints of weakness, lightheadedness and hypotension.  Order received for pt to get an additional 500 ml of IV NS, 40 meq of oral potassium and to hold Cardizem per Dr. Marin Olp.  Pt to return on Thursday, 12/20/21 for labs and possible IVF's per Dr. Marin Olp.  Message sent to scheduling and pt notified.

## 2021-12-18 NOTE — Patient Instructions (Signed)

## 2021-12-20 ENCOUNTER — Inpatient Hospital Stay: Payer: Medicare PPO

## 2021-12-20 ENCOUNTER — Other Ambulatory Visit: Payer: Self-pay | Admitting: Hematology & Oncology

## 2021-12-20 VITALS — BP 134/80 | HR 113 | Temp 97.6°F | Resp 18

## 2021-12-20 DIAGNOSIS — C679 Malignant neoplasm of bladder, unspecified: Secondary | ICD-10-CM | POA: Diagnosis not present

## 2021-12-20 DIAGNOSIS — E878 Other disorders of electrolyte and fluid balance, not elsewhere classified: Secondary | ICD-10-CM

## 2021-12-20 DIAGNOSIS — C787 Secondary malignant neoplasm of liver and intrahepatic bile duct: Secondary | ICD-10-CM | POA: Diagnosis not present

## 2021-12-20 DIAGNOSIS — C7951 Secondary malignant neoplasm of bone: Secondary | ICD-10-CM | POA: Diagnosis not present

## 2021-12-20 DIAGNOSIS — D509 Iron deficiency anemia, unspecified: Secondary | ICD-10-CM | POA: Diagnosis not present

## 2021-12-20 DIAGNOSIS — Z5111 Encounter for antineoplastic chemotherapy: Secondary | ICD-10-CM | POA: Diagnosis not present

## 2021-12-20 LAB — CBC WITH DIFFERENTIAL (CANCER CENTER ONLY)
Abs Immature Granulocytes: 0.01 K/uL (ref 0.00–0.07)
Basophils Absolute: 0 K/uL (ref 0.0–0.1)
Basophils Relative: 0 %
Eosinophils Absolute: 0.1 K/uL (ref 0.0–0.5)
Eosinophils Relative: 2 %
HCT: 27.6 % — ABNORMAL LOW (ref 36.0–46.0)
Hemoglobin: 9 g/dL — ABNORMAL LOW (ref 12.0–15.0)
Immature Granulocytes: 0 %
Lymphocytes Relative: 9 %
Lymphs Abs: 0.3 K/uL — ABNORMAL LOW (ref 0.7–4.0)
MCH: 31.6 pg (ref 26.0–34.0)
MCHC: 32.6 g/dL (ref 30.0–36.0)
MCV: 96.8 fL (ref 80.0–100.0)
Monocytes Absolute: 0 K/uL — ABNORMAL LOW (ref 0.1–1.0)
Monocytes Relative: 1 %
Neutro Abs: 2.6 K/uL (ref 1.7–7.7)
Neutrophils Relative %: 88 %
Platelet Count: 94 K/uL — ABNORMAL LOW (ref 150–400)
RBC: 2.85 MIL/uL — ABNORMAL LOW (ref 3.87–5.11)
RDW: 18.6 % — ABNORMAL HIGH (ref 11.5–15.5)
WBC Count: 2.9 K/uL — ABNORMAL LOW (ref 4.0–10.5)
nRBC: 0 % (ref 0.0–0.2)

## 2021-12-20 LAB — CMP (CANCER CENTER ONLY)
ALT: 20 U/L (ref 0–44)
AST: 13 U/L — ABNORMAL LOW (ref 15–41)
Albumin: 2.9 g/dL — ABNORMAL LOW (ref 3.5–5.0)
Alkaline Phosphatase: 124 U/L (ref 38–126)
Anion gap: 7 (ref 5–15)
BUN: 20 mg/dL (ref 8–23)
CO2: 26 mmol/L (ref 22–32)
Calcium: 8.3 mg/dL — ABNORMAL LOW (ref 8.9–10.3)
Chloride: 99 mmol/L (ref 98–111)
Creatinine: 0.66 mg/dL (ref 0.44–1.00)
GFR, Estimated: >60 mL/min (ref >60)
Glucose, Bld: 187 mg/dL — ABNORMAL HIGH (ref 70–99)
Potassium: 3.9 mmol/L (ref 3.5–5.1)
Sodium: 132 mmol/L — ABNORMAL LOW (ref 135–145)
Total Bilirubin: 0.4 mg/dL (ref 0.3–1.2)
Total Protein: 5.5 g/dL — ABNORMAL LOW (ref 6.5–8.1)

## 2021-12-20 MED ORDER — SODIUM CHLORIDE 0.9% FLUSH
10.0000 mL | Freq: Once | INTRAVENOUS | Status: AC
  Administered 2021-12-20: 10 mL via INTRAVENOUS

## 2021-12-20 MED ORDER — HEPARIN SOD (PORK) LOCK FLUSH 100 UNIT/ML IV SOLN
500.0000 [IU] | Freq: Once | INTRAVENOUS | Status: AC
  Administered 2021-12-20: 500 [IU] via INTRAVENOUS

## 2021-12-20 NOTE — Addendum Note (Signed)
Addended by: Shelda Altes on: 12/20/2021 02:09 PM   Modules accepted: Orders

## 2021-12-26 ENCOUNTER — Other Ambulatory Visit: Payer: Self-pay

## 2021-12-26 ENCOUNTER — Emergency Department (HOSPITAL_COMMUNITY): Payer: Medicare PPO

## 2021-12-26 ENCOUNTER — Encounter (HOSPITAL_COMMUNITY): Payer: Self-pay

## 2021-12-26 ENCOUNTER — Inpatient Hospital Stay (HOSPITAL_COMMUNITY)
Admission: EM | Admit: 2021-12-26 | Discharge: 2022-01-03 | DRG: 391 | Disposition: A | Discharge status: Home / Self Care | Payer: Medicare PPO | Attending: Family Medicine | Admitting: Family Medicine

## 2021-12-26 ENCOUNTER — Telehealth: Payer: Self-pay | Admitting: *Deleted

## 2021-12-26 DIAGNOSIS — E871 Hypo-osmolality and hyponatremia: Secondary | ICD-10-CM | POA: Diagnosis present

## 2021-12-26 DIAGNOSIS — R1084 Generalized abdominal pain: Secondary | ICD-10-CM | POA: Diagnosis not present

## 2021-12-26 DIAGNOSIS — E440 Moderate protein-calorie malnutrition: Secondary | ICD-10-CM

## 2021-12-26 DIAGNOSIS — R109 Unspecified abdominal pain: Secondary | ICD-10-CM | POA: Diagnosis not present

## 2021-12-26 DIAGNOSIS — F17210 Nicotine dependence, cigarettes, uncomplicated: Secondary | ICD-10-CM | POA: Diagnosis not present

## 2021-12-26 DIAGNOSIS — Z79899 Other long term (current) drug therapy: Secondary | ICD-10-CM | POA: Diagnosis not present

## 2021-12-26 DIAGNOSIS — Z79891 Long term (current) use of opiate analgesic: Secondary | ICD-10-CM | POA: Diagnosis not present

## 2021-12-26 DIAGNOSIS — E785 Hyperlipidemia, unspecified: Secondary | ICD-10-CM | POA: Diagnosis present

## 2021-12-26 DIAGNOSIS — C7951 Secondary malignant neoplasm of bone: Secondary | ICD-10-CM | POA: Diagnosis present

## 2021-12-26 DIAGNOSIS — K529 Noninfective gastroenteritis and colitis, unspecified: Principal | ICD-10-CM | POA: Diagnosis present

## 2021-12-26 DIAGNOSIS — R0902 Hypoxemia: Secondary | ICD-10-CM | POA: Diagnosis not present

## 2021-12-26 DIAGNOSIS — Z923 Personal history of irradiation: Secondary | ICD-10-CM | POA: Diagnosis not present

## 2021-12-26 DIAGNOSIS — A4901 Methicillin susceptible Staphylococcus aureus infection, unspecified site: Secondary | ICD-10-CM | POA: Diagnosis not present

## 2021-12-26 DIAGNOSIS — D61818 Other pancytopenia: Secondary | ICD-10-CM | POA: Diagnosis not present

## 2021-12-26 DIAGNOSIS — I10 Essential (primary) hypertension: Secondary | ICD-10-CM | POA: Diagnosis present

## 2021-12-26 DIAGNOSIS — R000 Tachycardia, unspecified: Secondary | ICD-10-CM | POA: Diagnosis not present

## 2021-12-26 DIAGNOSIS — E876 Hypokalemia: Secondary | ICD-10-CM | POA: Diagnosis present

## 2021-12-26 DIAGNOSIS — I700 Atherosclerosis of aorta: Secondary | ICD-10-CM | POA: Diagnosis not present

## 2021-12-26 DIAGNOSIS — C679 Malignant neoplasm of bladder, unspecified: Secondary | ICD-10-CM | POA: Diagnosis present

## 2021-12-26 DIAGNOSIS — C50919 Malignant neoplasm of unspecified site of unspecified female breast: Secondary | ICD-10-CM | POA: Diagnosis not present

## 2021-12-26 DIAGNOSIS — D61810 Antineoplastic chemotherapy induced pancytopenia: Secondary | ICD-10-CM | POA: Diagnosis present

## 2021-12-26 DIAGNOSIS — C787 Secondary malignant neoplasm of liver and intrahepatic bile duct: Secondary | ICD-10-CM | POA: Diagnosis present

## 2021-12-26 DIAGNOSIS — C775 Secondary and unspecified malignant neoplasm of intrapelvic lymph nodes: Secondary | ICD-10-CM | POA: Diagnosis present

## 2021-12-26 DIAGNOSIS — R1030 Lower abdominal pain, unspecified: Secondary | ICD-10-CM

## 2021-12-26 DIAGNOSIS — T451X5A Adverse effect of antineoplastic and immunosuppressive drugs, initial encounter: Secondary | ICD-10-CM | POA: Diagnosis present

## 2021-12-26 DIAGNOSIS — R197 Diarrhea, unspecified: Secondary | ICD-10-CM | POA: Diagnosis not present

## 2021-12-26 DIAGNOSIS — K5190 Ulcerative colitis, unspecified, without complications: Secondary | ICD-10-CM | POA: Diagnosis not present

## 2021-12-26 LAB — CBC
HCT: 21.3 % — ABNORMAL LOW (ref 36.0–46.0)
Hemoglobin: 7 g/dL — ABNORMAL LOW (ref 12.0–15.0)
MCH: 31.7 pg (ref 26.0–34.0)
MCHC: 32.9 g/dL (ref 30.0–36.0)
MCV: 96.4 fL (ref 80.0–100.0)
Platelets: 12 10*3/uL — CL (ref 150–400)
RBC: 2.21 MIL/uL — ABNORMAL LOW (ref 3.87–5.11)
RDW: 17.3 % — ABNORMAL HIGH (ref 11.5–15.5)
WBC: 0.5 10*3/uL — CL (ref 4.0–10.5)
nRBC: 4.3 % — ABNORMAL HIGH (ref 0.0–0.2)

## 2021-12-26 LAB — COMPREHENSIVE METABOLIC PANEL
ALT: 8 U/L (ref 0–44)
ALT: 9 U/L (ref 0–44)
AST: 9 U/L — ABNORMAL LOW (ref 15–41)
AST: 8 U/L — ABNORMAL LOW (ref 15–41)
Albumin: 2 g/dL — ABNORMAL LOW (ref 3.5–5.0)
Albumin: 2.2 g/dL — ABNORMAL LOW (ref 3.5–5.0)
Alkaline Phosphatase: 96 U/L (ref 38–126)
Alkaline Phosphatase: 99 U/L (ref 38–126)
Anion gap: 7 (ref 5–15)
Anion gap: 9 (ref 5–15)
BUN: 14 mg/dL (ref 8–23)
BUN: 15 mg/dL (ref 8–23)
CO2: 23 mmol/L (ref 22–32)
CO2: 20 mmol/L — ABNORMAL LOW (ref 22–32)
Calcium: 7.1 mg/dL — ABNORMAL LOW (ref 8.9–10.3)
Calcium: 7.1 mg/dL — ABNORMAL LOW (ref 8.9–10.3)
Chloride: 102 mmol/L (ref 98–111)
Chloride: 103 mmol/L (ref 98–111)
Creatinine, Ser: 0.79 mg/dL (ref 0.44–1.00)
Creatinine, Ser: 0.56 mg/dL (ref 0.44–1.00)
GFR, Estimated: >60 mL/min (ref >60)
GFR, Estimated: >60 mL/min (ref >60)
Glucose, Bld: 120 mg/dL — ABNORMAL HIGH (ref 70–99)
Glucose, Bld: 111 mg/dL — ABNORMAL HIGH (ref 70–99)
Potassium: 4 mmol/L (ref 3.5–5.1)
Potassium: 3.8 mmol/L (ref 3.5–5.1)
Sodium: 132 mmol/L — ABNORMAL LOW (ref 135–145)
Sodium: 132 mmol/L — ABNORMAL LOW (ref 135–145)
Total Bilirubin: 0.5 mg/dL (ref 0.3–1.2)
Total Bilirubin: 0.8 mg/dL (ref 0.3–1.2)
Total Protein: 5.2 g/dL — ABNORMAL LOW (ref 6.5–8.1)
Total Protein: 5.5 g/dL — ABNORMAL LOW (ref 6.5–8.1)

## 2021-12-26 LAB — LACTIC ACID, PLASMA
Lactic Acid, Venous: 1.4 mmol/L (ref 0.5–1.9)
Lactic Acid, Venous: 1.4 mmol/L (ref 0.5–1.9)

## 2021-12-26 LAB — URINALYSIS, ROUTINE W REFLEX MICROSCOPIC
Bilirubin Urine: NEGATIVE
Glucose, UA: NEGATIVE mg/dL
Ketones, ur: 5 mg/dL — AB
Leukocytes,Ua: NEGATIVE
Nitrite: NEGATIVE
Protein, ur: 30 mg/dL — AB
Specific Gravity, Urine: 1.021 (ref 1.005–1.030)
pH: 5 (ref 5.0–8.0)

## 2021-12-26 LAB — POC OCCULT BLOOD, ED: Fecal Occult Bld: NEGATIVE

## 2021-12-26 LAB — PREPARE RBC (CROSSMATCH)

## 2021-12-26 MED ORDER — CHLORHEXIDINE GLUCONATE CLOTH 2 % EX PADS
6.0000 | MEDICATED_PAD | Freq: Every day | CUTANEOUS | Status: DC
  Administered 2021-12-26 – 2022-01-03 (×6): 6 via TOPICAL

## 2021-12-26 MED ORDER — SODIUM CHLORIDE 0.9 % IV SOLN: INTRAVENOUS | Status: AC

## 2021-12-26 MED ORDER — SODIUM CHLORIDE 0.9% FLUSH: 10.0000 mL | INTRAVENOUS | Status: DC | PRN

## 2021-12-26 MED ORDER — SODIUM CHLORIDE 0.9 % IV SOLN
2.0000 g | INTRAVENOUS | Status: AC
  Administered 2021-12-26: 2 g via INTRAVENOUS
  Filled 2021-12-26: qty 12.5

## 2021-12-26 MED ORDER — IOHEXOL 300 MG/ML  SOLN
100.0000 mL | Freq: Once | INTRAMUSCULAR | Status: AC | PRN
  Administered 2021-12-26: 100 mL via INTRAVENOUS

## 2021-12-26 MED ORDER — PIPERACILLIN-TAZOBACTAM 4.5 G IVPB
4.5000 g | Freq: Four times a day (QID) | INTRAVENOUS | Status: DC
Start: 2021-12-26 — End: 2021-12-26

## 2021-12-26 MED ORDER — LACTATED RINGERS IV BOLUS
1000.0000 mL | Freq: Once | INTRAVENOUS | Status: AC
  Administered 2021-12-26: 1000 mL via INTRAVENOUS

## 2021-12-26 MED ORDER — SODIUM CHLORIDE 0.9 % IV SOLN
10.0000 mL/h | Freq: Once | INTRAVENOUS | Status: AC
  Administered 2021-12-26: 10 mL/h via INTRAVENOUS

## 2021-12-26 MED ORDER — METRONIDAZOLE 500 MG/100ML IV SOLN
500.0000 mg | Freq: Two times a day (BID) | INTRAVENOUS | Status: DC
Start: 2021-12-26 — End: 2021-12-26
  Administered 2021-12-26: 500 mg via INTRAVENOUS
  Filled 2021-12-26: qty 100

## 2021-12-26 MED ORDER — PIPERACILLIN-TAZOBACTAM 3.375 G IVPB
3.3750 g | Freq: Three times a day (TID) | INTRAVENOUS | Status: DC
  Administered 2021-12-27 – 2021-12-28 (×5): 3.375 g via INTRAVENOUS
  Filled 2021-12-26 (×5): qty 50

## 2021-12-26 MED ORDER — LACTATED RINGERS IV SOLN: INTRAVENOUS | Status: DC

## 2021-12-26 MED ORDER — OXYCODONE HCL ER 10 MG PO T12A: 10.0000 mg | EXTENDED_RELEASE_TABLET | Freq: Two times a day (BID) | ORAL | Status: DC

## 2021-12-26 MED ORDER — MORPHINE SULFATE (PF) 2 MG/ML IV SOLN
2.0000 mg | INTRAVENOUS | Status: DC | PRN
  Administered 2021-12-27 – 2022-01-03 (×28): 2 mg via INTRAVENOUS
  Filled 2021-12-26 (×28): qty 1

## 2021-12-26 NOTE — ED Provider Notes (Signed)
[facility] DEPT Provider Note   CSN: [id number] Arrival date & time: 12/26/21  1055     History  No chief complaint on file.   Cheryl Singh is a 71 y.o. female.  HPI 71 yo female recent history of colitis, patient has been home and improved but is now worsening.  She has a history of anemia, metastatic bladder cancer, hyperlipidemia and a Klebsiella urinary tract infection.  She reports over the past couple days the pain has become worse.  She has had nausea decreased appetite with some vomiting.  She reports multiple soft stools which does not think that they have been dark.  She is generally weak but does not report any definitive fevers.     Home Medications Prior to Admission medications   Medication Sig Start Date End Date Taking? Authorizing Provider  atorvastatin (LIPITOR) 10 MG tablet Take 10 mg by mouth every evening.   Yes [provider]  celecoxib (CELEBREX) 200 MG capsule Take 1 capsule (200 mg total) by mouth 2 (two) times daily. 09/04/21  Yes Ennever, Rudell Cobb, MD  dexamethasone (DECADRON) 4 MG tablet Take 2 tablets (8 mg total) by mouth daily. Take daily x 3 days starting the day after cisplatin chemotherapy. Take with food. 12/12/21  Yes Celso Amy, NP  famotidine (PEPCID) 40 MG tablet Take 1 tablet (40 mg total) by mouth 2 (two) times daily. 09/04/21  Yes Ennever, Rudell Cobb, MD  KLOR-CON M20 20 MEQ tablet TAKE 1 TABLET BY MOUTH EVERY DAY Patient taking differently: Take 20 mEq by mouth daily. 12/20/21  Yes Ennever, Rudell Cobb, MD  Multiple Vitamins-Minerals (MULTIVITAMIN WITH MINERALS) tablet Take 1 tablet by mouth daily.   Yes [provider]  ondansetron (ZOFRAN) 8 MG tablet Take 1 tablet (8 mg total) by mouth 2 (two) times daily as needed. Start on the third day after cisplatin chemotherapy. 08/22/21  Yes Volanda Napoleon, MD  oxyCODONE ER [facility]) 9 MG C12A Take 9 mg by mouth every 12 (twelve) hours. 11/30/21  Yes  Volanda Napoleon, MD  oxyCODONE-acetaminophen (PERCOCET/ROXICET) 5-325 MG tablet Take 1-2 tablets by mouth every 4 (four) hours as needed for severe pain. 11/30/21  Yes Ennever, Rudell Cobb, MD  polyethylene glycol (MIRALAX / GLYCOLAX) 17 g packet Take 17 g by mouth daily as needed for mild constipation.   Yes [provider]  prochlorperazine (COMPAZINE) 10 MG tablet Take 1 tablet (10 mg total) by mouth every 6 (six) hours as needed (Nausea or vomiting). 11/30/21  Yes Ennever, Rudell Cobb, MD  senna-docusate (SENOKOT-S) 8.6-50 MG tablet Take 2 tablets by mouth 2 (two) times daily. Patient taking differently: Take 2 tablets by mouth 2 (two) times daily as needed for mild constipation. 11/23/21  Yes Bonnielee Haff, MD  Simethicone (GAS RELIEF) 250 MG CAPS Take 1 capsule by mouth daily as needed (for flatulence).   Yes [provider]  gabapentin (NEURONTIN) 300 MG capsule Take 1 capsule (300 mg total) by mouth 3 (three) times daily. Patient not taking: Reported on 11/12/2021 10/19/21   Volanda Napoleon, MD      Allergies    Patient has no known allergies.    Review of Systems   Review of Systems  Physical Exam Updated Vital Signs BP (!) 150/72   Pulse (!) 105   Temp 99.2 F (37.3 C) (Oral)   Resp (!) 21   Ht 1.626 m ('5\' 4"'$ )   Wt 68 kg  SpO2 93%   BMI 25.75 kg/m  Physical Exam Vitals and nursing note reviewed.  Constitutional:      General: She is not in acute distress.    Appearance: She is ill-appearing.  HENT:     Head: Normocephalic.     Right Ear: External ear normal.     Left Ear: External ear normal.     Nose: Nose normal.     Mouth/Throat:     Mouth: Mucous membranes are moist.     Pharynx: Oropharynx is clear.  Eyes:     Comments: Conjunctive are pale  Cardiovascular:     Rate and Rhythm: Regular rhythm. Tachycardia present.     Pulses: Normal pulses.     Heart sounds: Normal heart sounds.  Pulmonary:     Effort: Pulmonary effort is normal.     Breath  sounds: Normal breath sounds.  Abdominal:     Comments: Abdomen is distended with decreased bowel sounds and diffuse tenderness to palpation  Musculoskeletal:        General: Normal range of motion.     Cervical back: Normal range of motion.  Skin:    General: Skin is warm.  Neurological:     General: No focal deficit present.     Mental Status: She is alert.  Psychiatric:        Mood and Affect: Mood normal.     ED Results / Procedures / Treatments   Labs (all labs ordered are listed, but only abnormal results are displayed) Labs Reviewed  CBC - Abnormal; Notable for the following components:      Result Value   WBC 0.4 (*)    RBC 1.99 (*)    Hemoglobin 6.2 (*)    HCT 19.2 (*)    RDW 17.6 (*)    Platelets 13 (*)    nRBC 5.6 (*)    All other components within normal limits  COMPREHENSIVE METABOLIC PANEL - Abnormal; Notable for the following components:   Sodium 132 (*)    Glucose, Bld 120 (*)    Calcium 7.1 (*)    Total Protein 5.2 (*)    Albumin 2.0 (*)    AST 9 (*)    All other components within normal limits  URINALYSIS, ROUTINE W REFLEX MICROSCOPIC - Abnormal; Notable for the following components:   Color, Urine AMBER (*)    APPearance HAZY (*)    Hgb urine dipstick SMALL (*)    Ketones, ur 5 (*)    Protein, ur 30 (*)    Bacteria, UA RARE (*)    All other components within normal limits  CULTURE, BLOOD (ROUTINE X 2)  CULTURE, BLOOD (ROUTINE X 2)  C DIFFICILE QUICK SCREEN W PCR REFLEX    LACTIC ACID, PLASMA  LACTIC ACID, PLASMA  POC OCCULT BLOOD, ED  PREPARE RBC (CROSSMATCH)  TYPE AND SCREEN    EKG None  Radiology CT ABDOMEN PELVIS W CONTRAST  Result Date: 12/26/2021 CLINICAL DATA:  Abdominal pain.  History of bladder cancer EXAM: CT ABDOMEN AND PELVIS WITH CONTRAST TECHNIQUE: Multidetector CT imaging of the abdomen and pelvis was performed using the standard protocol following bolus administration of intravenous contrast. RADIATION DOSE REDUCTION:  This exam was performed according to the departmental dose-optimization program which includes automated exposure control, adjustment of the mA and/or kV according to patient size and/or use of iterative reconstruction technique. CONTRAST:  154m OMNIPAQUE IOHEXOL 300 MG/ML  SOLN COMPARISON:  CT 11/12/2021 FINDINGS: Lower chest: No acute abnormality.  Hepatobiliary: Approximally 1.0 cm hypodense lesion within the right hepatic lobe (series 2, image 18), unchanged from prior. No new focal liver lesion is identified. Gallbladder within normal limits. No biliary dilatation. Pancreas: Unremarkable. No pancreatic ductal dilatation or surrounding inflammatory changes. Spleen: Normal in size without focal abnormality. Adrenals/Urinary Tract: Unremarkable adrenal glands. Kidneys enhance symmetrically. No renal stone or hydronephrosis. Diffusely thickened appearance of the urinary bladder with left-sided mucosal enhancement compatible with history of known bladder carcinoma. Stomach/Bowel: Prominent long segment circumferential thickening of the rectosigmoid colon. Slight interval decrease in the degree of pericolonic fat stranding. Numerous diverticula throughout the affected segment. Multiple mildly prominent fluid-filled loops of small bowel throughout the abdomen measuring up to 2.9 cm in diameter. No discrete transition point. Normal appendix in the right lower quadrant. Vascular/Lymphatic: Aortic atherosclerosis. No enlarged abdominal or pelvic lymph nodes. Reproductive: Uterus and bilateral adnexa are unremarkable. Other: No ascites. No pneumoperitoneum. Fat containing umbilical hernia. Musculoskeletal: Multifocal sites of osseous metastatic disease throughout the included spine, ribs, pelvis, and bilateral femurs. No definite interval progression from prior. Subacute appearing bilateral sacral fractures (series 4, image 64), which could represent insufficiency fractures or pathologic fractures related to underlying  metastatic disease. IMPRESSION: 1. Prominent long segment circumferential thickening of the rectosigmoid colon compatible with acute colitis. Slight interval decrease in the degree of pericolonic fat stranding. 2. Multiple mildly prominent fluid-filled loops of small bowel throughout the abdomen without discrete transition point. Findings may represent ileus versus developing small bowel obstruction. 3. Diffuse osseous metastatic disease without definite interval progression from prior. 4. Subacute-appearing bilateral sacral fractures which could represent insufficiency fractures or pathologic fractures related to underlying metastatic disease. 5. No apparent progression in hepatic metastatic disease. Aortic Atherosclerosis (ICD10-I70.0). Electronically Signed   By: Davina Poke D.O.   On: 12/26/2021 13:46    Procedures Procedures    Medications Ordered in ED Medications  metroNIDAZOLE (FLAGYL) IVPB 500 mg (0 mg Intravenous Stopped 12/26/21 1500)  lactated ringers bolus 1,000 mL (0 mLs Intravenous Stopped 12/26/21 1341)  iohexol (OMNIPAQUE) 300 MG/ML solution 100 mL (100 mLs Intravenous Contrast Given 12/26/21 1312)  ceFEPIme (MAXIPIME) 2 g in sodium chloride 0.9 % 100 mL IVPB (0 g Intravenous Stopped 12/26/21 1405)  0.9 %  sodium chloride infusion (10 mL/hr Intravenous New Bag/Given 12/26/21 1341)    ED Course/ Medical Decision Making/ A&P Clinical Course as of 12/26/21 1542  Wed Dec 26, 2021  1305 CBC reviewed and interpreted and significant for pancytopenia [DR]  1306 White count is 400, no differential is back but is pancytopenic and neutropenic regardless Platelets are 13,000 [DR]    Clinical Course User Index [DR] Pattricia Boss, MD                           Medical Decision Making 71 year old female with known history of metastatic bladder cancer, on chemotherapy, cisplatin and, presents today with ongoing abdominal pain.  Patient with CT consistent with colitis. Patient with  pancytopenia with total white count 400, hemoglobin 6, and platelets are 13,000 1 anemia patient is being transfused 2 units packed red blood cells 2 patient with neutropenia and increased risk for infection, patient presents with tachycardia and abdominal pain, lactic acid is normal, blood pressure is normal, doubt sepsis at this time 3 patient with colitis, secondary to #2 patient treated with Maxipime and Flagyl 4 thrombocytopenia no evidence of acute bleeding at this time Care discussed with Dr. Alen Blew, on-call for oncology and  advises to add Dr. Marin Olp for treatment team and will be seen Plan and discussion with hospitalist for admission  Amount and/or Complexity of Data Reviewed Labs: ordered. Decision-making details documented in ED Course. Radiology: ordered and independent interpretation performed. Decision-making details documented in ED Course. ECG/medicine tests: ordered. Discussion of management or test interpretation with external provider(s): Care discussed with Dr. Alen Blew Care discussed with Dr. Posey Pronto who will see for admission  Risk Prescription drug management.           Final Clinical Impression(s) / ED Diagnoses Final diagnoses:  Pancytopenia ([place])  Colitis    Rx / DC Orders ED Discharge Orders     None         Pattricia Boss, MD 12/26/21 1542

## 2021-12-26 NOTE — ED Triage Notes (Signed)
Patient BIB EMS from home. Patient c/o abdominal pain for 5 days. Hx of diverticulitis and bladder cancer. Patient states last flair up of diverticulitis was about 6 weeks ago.

## 2021-12-26 NOTE — Telephone Encounter (Signed)
Message received from patient to inform Dr. Marin Olp that she is "in a bad way with terrible abdominal pain and nausea."  Dr. Marin Olp notified.  Call placed back to patient and patient notified per order of Dr. Marin Olp to go to the [facility] ER now.  Pt states that Cheryl Singh is going to call EMS now to get her to the [facility] ER.  Dr. Marin Olp notified.

## 2021-12-26 NOTE — H&P (Addendum)
History and Physical    Patient: Cheryl Singh PZW:[id number] DOB: [birth year] DOA: 12/26/2021 DOS: the patient was seen and examined on 12/26/2021 PCP: Glenis Smoker, MD  Patient coming from: Home  Chief Complaint: No chief complaint on file.  HPI: Cheryl Singh is a 71 y.o. female with medical history significant of hyperlipidemia, hypertension, anemia, metastatic bladder cancer  recently admitted for acute colitis coming in with left-sided abdominal pain similar to her previous presentation concerning for recurrent acute colitis.  She reports her symptoms started 4 to or so days ago been progressively worsening since that time.  She is still passing gas, last bowel movement was yesterday but a small bowel movement.  No blood or bowel movement.  She reports nausea, but no vomiting at this time.  No fevers, chills, symptoms, chest pain, shortness of breath.  No dark stools or blood in her stools.  She completed a round of chemotherapy last Tuesday.  Oncologist Dr. Marin Olp who is aware of her admission.  Review of Systems: As mentioned in the history of present illness. All other systems reviewed and are negative. 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[place]) 08/16/2021   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 ([place]) 08/16/2021   Goals of care, counseling/discussion 08/16/2021   History of radiation therapy    lumbar spine, left ribs  09/18/2021-10/02/2021  Dr Gery Pray   Hypertension    Past Surgical History:  Procedure Laterality Date   HEART CHAMBER REVISION     IR IMAGING GUIDED PORT INSERTION  08/20/2021   TRANSURETHRAL RESECTION OF BLADDER TUMOR Bilateral 07/17/2021   Procedure: CYSTOSCOPY BILATERAL RETROGRADES TRANSURETHRAL RESECTION OF BLADDER TUMOR (TURBT);  Surgeon: Remi Haggard, MD;  Location: [facility] ORS;  Service: Urology;  Laterality:  Bilateral;   Social History:  reports that she has been smoking cigarettes. She has a 20.00 pack-year smoking history. She has never used smokeless tobacco. She reports current alcohol use. She reports that she does not use drugs.  No Known Allergies  History reviewed. No pertinent family history.  Prior to Admission medications   Medication Sig Start Date End Date Taking? Authorizing Provider  atorvastatin (LIPITOR) 10 MG tablet Take 10 mg by mouth every evening.   Yes [provider]  celecoxib (CELEBREX) 200 MG capsule Take 1 capsule (200 mg total) by mouth 2 (two) times daily. 09/04/21  Yes Ennever, Rudell Cobb, MD  dexamethasone (DECADRON) 4 MG tablet Take 2 tablets (8 mg total) by mouth daily. Take daily x 3 days starting the day after cisplatin chemotherapy. Take with food. 12/12/21  Yes Celso Amy, NP  famotidine (PEPCID) 40 MG tablet Take 1 tablet (40 mg total) by mouth 2 (two) times daily. 09/04/21  Yes Ennever, Rudell Cobb, MD  KLOR-CON M20 20 MEQ tablet TAKE 1 TABLET BY MOUTH EVERY DAY Patient taking differently: Take 20 mEq by mouth daily. 12/20/21  Yes Ennever, Rudell Cobb, MD  Multiple Vitamins-Minerals (MULTIVITAMIN WITH MINERALS) tablet Take 1 tablet by mouth daily.   Yes [provider]  ondansetron (ZOFRAN) 8 MG tablet Take 1 tablet (8 mg total) by mouth 2 (two) times daily as needed. Start on the third day after cisplatin chemotherapy. 08/22/21  Yes Volanda Napoleon, MD  oxyCODONE ER [facility] ER) 9 MG C12A Take 9 mg by mouth every 12 (twelve) hours. 11/30/21  Yes Volanda Napoleon,  MD  oxyCODONE-acetaminophen (PERCOCET/ROXICET) 5-325 MG tablet Take 1-2 tablets by mouth every 4 (four) hours as needed for severe pain. 11/30/21  Yes Ennever, Rudell Cobb, MD  polyethylene glycol (MIRALAX / GLYCOLAX) 17 g packet Take 17 g by mouth daily as needed for mild constipation.   Yes [provider]  prochlorperazine (COMPAZINE) 10 MG tablet Take 1 tablet (10 mg total) by mouth every 6  (six) hours as needed (Nausea or vomiting). 11/30/21  Yes Ennever, Rudell Cobb, MD  senna-docusate (SENOKOT-S) 8.6-50 MG tablet Take 2 tablets by mouth 2 (two) times daily. Patient taking differently: Take 2 tablets by mouth 2 (two) times daily as needed for mild constipation. 11/23/21  Yes Bonnielee Haff, MD  Simethicone (GAS RELIEF) 250 MG CAPS Take 1 capsule by mouth daily as needed (for flatulence).   Yes [provider]  gabapentin (NEURONTIN) 300 MG capsule Take 1 capsule (300 mg total) by mouth 3 (three) times daily. Patient not taking: Reported on 11/12/2021 10/19/21   Volanda Napoleon, MD    Physical Exam: Vitals:   12/26/21 1630 12/26/21 1645 12/26/21 1700 12/26/21 1715  BP: (!) 142/89 127/67 (!) 143/74 114/66  Pulse: (!) 114 (!) 112 (!) 106 (!) 113  Resp: 17 18 (!) 25 (!) 21  Temp:      TempSrc:      SpO2: 99% 97% 97% 98%  Weight:      Height:       Physical Exam Vitals and nursing note reviewed.  Constitutional:      General: She is not in acute distress.    Appearance: Normal appearance. She is ill-appearing. She is not toxic-appearing.  HENT:     Head: Normocephalic and atraumatic.     Mouth/Throat:     Mouth: Mucous membranes are moist.  Cardiovascular:     Rate and Rhythm: Normal rate and regular rhythm.  Abdominal:     General: Abdomen is flat. There is distension.     Palpations: Abdomen is soft. There is no mass.     Tenderness: There is abdominal tenderness. There is no guarding or rebound.  Musculoskeletal:     Right lower leg: No edema.     Left lower leg: No edema.  Skin:    General: Skin is warm and dry.     Capillary Refill: Capillary refill takes less than 2 seconds.  Neurological:     Mental Status: She is alert.  Psychiatric:        Mood and Affect: Mood normal.        Behavior: Behavior normal.     Data Reviewed:     Latest Ref Rng & Units 12/26/2021    4:00 PM 12/26/2021   12:08 PM 12/20/2021   12:56 PM  CBC  WBC 4.0 - 10.5 K/uL 0.5   0.4  2.9   Hemoglobin 12.0 - 15.0 g/dL 7.0  6.2  9.0   Hematocrit 36.0 - 46.0 % 21.3  19.2  27.6   Platelets 150 - 400 K/uL 12  13  94       Latest Ref Rng & Units 12/26/2021    4:00 PM 12/26/2021   12:08 PM 12/20/2021   12:56 PM  BMP  Glucose 70 - 99 mg/dL 111  120  187   BUN 8 - 23 mg/dL '15  14  20   '$ Creatinine 0.44 - 1.00 mg/dL 0.56  0.79  0.66   Sodium 135 - 145 mmol/L 132  132  132  Potassium 3.5 - 5.1 mmol/L 3.8  4.0  3.9   Chloride 98 - 111 mmol/L 103  102  99   CO2 22 - 32 mmol/L '20  23  26   '$ Calcium 8.9 - 10.3 mg/dL 7.1  7.1  8.3     CT: 1. Prominent long segment circumferential thickening of the rectosigmoid colon compatible with acute colitis. Slight interval decrease in the degree of pericolonic fat stranding. 2. Multiple mildly prominent fluid-filled loops of small bowel throughout the abdomen without discrete transition point. Findings may represent ileus versus developing small bowel obstruction. 3. Diffuse osseous metastatic disease without definite interval progression from prior. 4. Subacute-appearing bilateral sacral fractures which could represent insufficiency fractures or pathologic fractures related to underlying metastatic disease. 5. No apparent progression in hepatic metastatic disease.  Assessment and Plan:  ONICA DAVIDOVICH is a 71 y.o. female with medical history significant of hyperlipidemia, hypertension, anemia, metastatic bladder cancer  recently admitted for acute colitis coming in with left-sided abdominal pain similar to her previous presentation concerning for recurrent acute colitis.  Abdominal pain Acute colitis C/f Ileus vs. SBO Hx of metastatic bladder cancer Patient reports progressively worsening left lower abdominal pain that began 4 days ago and is similar to her abdominal pain when she was recently hospitalized for acute colitis.  She reports a small nonbloody and formed bowel movement yesterday and still is passing gas today.   On exam she has tenderness throughout her abdomen but is not peritoneal.  CT shows long segment of thickening in the rectosigmoid colon compatible with acute colitis slightly improved from prior and dilated loops of small bowel that may be ileus versus small bowel obstruction.  It is not clear why she has a recurrent colitis so soon after her recent hospitalization.   -IV fluids with NS at 75 cc for the next 12 hours -Antibiotics with Zosyn given her neutropenia -N.p.o. except for sips and chips for bowel rest --Blood cultures are pending  Anemia Thrombocytopenia Leukopenia Hx of metastatic bladder cancer Secondary to her reticulocyte chemotherapy that she got last week (cisplatin/gemcitabine) along with radiation to her spine.  Received 1 unit packed red blood cells in the ED.  She does not have evidence of active bleeding on exam, platelets are 13. -repeat CBC 2100 -Transfuse platelets for less than 10 or active bleeding -Neutropenic precautions - Oncology is aware for admission  Hyponatremia Sodium of 132, suspect hypovolemic secondary SBO.  Given 1 L LR in the ED. - Hyponatremia ER work-up including UA, urine sodium, urine osmolality, serum osmolality serum sodium --Repeat BMP at 2100 -Fluids as above  Pain Control She is on gabapentin 300 mg 3 times daily, oxycodone long and short acting at home.  We will try to cover all of these in the IV given her possible SBO.  HLD-holding atorvastatin 10 mg  Diet: NPO DVT ppx: holding for thrombocytopenia  Advance Care Planning:   Code Status: Prior Full Consults: None Family Communication: pt deferred  Severity of Illness: The appropriate patient status for this patient is INPATIENT. Inpatient status is judged to be reasonable and necessary in order to provide the required intensity of service to ensure the patient's safety. The patient's presenting symptoms, physical exam findings, and initial radiographic and laboratory data in the  context of their chronic comorbidities is felt to place them at high risk for further clinical deterioration. Furthermore, it is not anticipated that the patient will be medically stable for discharge from the hospital within 2  midnights of admission.   * I certify that at the point of admission it is my clinical judgment that the patient will require inpatient hospital care spanning beyond 2 midnights from the point of admission due to high intensity of service, high risk for further deterioration and high frequency of surveillance required.*  Author: Lorelei Pont, MD 12/26/2021 5:44 PM  For on call review [email or web address].

## 2021-12-26 NOTE — ED Notes (Signed)
Patient transported to CT 

## 2021-12-27 DIAGNOSIS — C775 Secondary and unspecified malignant neoplasm of intrapelvic lymph nodes: Secondary | ICD-10-CM

## 2021-12-27 DIAGNOSIS — C7951 Secondary malignant neoplasm of bone: Secondary | ICD-10-CM

## 2021-12-27 DIAGNOSIS — K529 Noninfective gastroenteritis and colitis, unspecified: Secondary | ICD-10-CM

## 2021-12-27 DIAGNOSIS — F17210 Nicotine dependence, cigarettes, uncomplicated: Secondary | ICD-10-CM

## 2021-12-27 DIAGNOSIS — C679 Malignant neoplasm of bladder, unspecified: Secondary | ICD-10-CM | POA: Diagnosis not present

## 2021-12-27 DIAGNOSIS — R109 Unspecified abdominal pain: Secondary | ICD-10-CM

## 2021-12-27 DIAGNOSIS — R197 Diarrhea, unspecified: Secondary | ICD-10-CM

## 2021-12-27 DIAGNOSIS — C787 Secondary malignant neoplasm of liver and intrahepatic bile duct: Secondary | ICD-10-CM | POA: Diagnosis not present

## 2021-12-27 DIAGNOSIS — K5190 Ulcerative colitis, unspecified, without complications: Secondary | ICD-10-CM

## 2021-12-27 LAB — BPAM RBC
Blood Product Expiration Date: 202307182359
Blood Product Expiration Date: 202307182359
ISSUE DATE / TIME: 202306281447
ISSUE DATE / TIME: 202306282055
Unit Type and Rh: 6200
Unit Type and Rh: 6200

## 2021-12-27 LAB — TYPE AND SCREEN
ABO/RH(D): AB POS
Antibody Screen: NEGATIVE
Unit division: 0
Unit division: 0

## 2021-12-27 LAB — C DIFFICILE QUICK SCREEN W PCR REFLEX
C Diff antigen: NEGATIVE
C Diff interpretation: NOT DETECTED
C Diff toxin: NEGATIVE

## 2021-12-27 LAB — SODIUM, URINE, RANDOM: Sodium, Ur: 75 mmol/L

## 2021-12-27 LAB — CBC
HCT: 26.6 % — ABNORMAL LOW (ref 36.0–46.0)
Hemoglobin: 9.2 g/dL — ABNORMAL LOW (ref 12.0–15.0)
MCH: 31.2 pg (ref 26.0–34.0)
MCHC: 34.6 g/dL (ref 30.0–36.0)
MCV: 90.2 fL (ref 80.0–100.0)
Platelets: 10 10*3/uL — CL (ref 150–400)
RBC: 2.95 MIL/uL — ABNORMAL LOW (ref 3.87–5.11)
RDW: 17.1 % — ABNORMAL HIGH (ref 11.5–15.5)
WBC: 0.9 10*3/uL — CL (ref 4.0–10.5)
nRBC: 6.7 % — ABNORMAL HIGH (ref 0.0–0.2)

## 2021-12-27 LAB — COMPREHENSIVE METABOLIC PANEL
ALT: 8 U/L (ref 0–44)
AST: 9 U/L — ABNORMAL LOW (ref 15–41)
Albumin: 2 g/dL — ABNORMAL LOW (ref 3.5–5.0)
Alkaline Phosphatase: 84 U/L (ref 38–126)
Anion gap: 9 (ref 5–15)
BUN: 15 mg/dL (ref 8–23)
CO2: 24 mmol/L (ref 22–32)
Calcium: 6.9 mg/dL — ABNORMAL LOW (ref 8.9–10.3)
Chloride: 103 mmol/L (ref 98–111)
Creatinine, Ser: 0.7 mg/dL (ref 0.44–1.00)
GFR, Estimated: >60 mL/min (ref >60)
Glucose, Bld: 93 mg/dL (ref 70–99)
Potassium: 3.8 mmol/L (ref 3.5–5.1)
Sodium: 136 mmol/L (ref 135–145)
Total Bilirubin: 1 mg/dL (ref 0.3–1.2)
Total Protein: 4.9 g/dL — ABNORMAL LOW (ref 6.5–8.1)

## 2021-12-27 LAB — OSMOLALITY, URINE: Osmolality, Ur: 681 mOsm/kg (ref 300–900)

## 2021-12-27 LAB — OSMOLALITY: Osmolality: 276 mOsm/kg (ref 275–295)

## 2021-12-27 MED ORDER — BOOST / RESOURCE BREEZE PO LIQD CUSTOM
1.0000 | Freq: Three times a day (TID) | ORAL | Status: DC
  Administered 2021-12-27 – 2022-01-01 (×3): 1 via ORAL

## 2021-12-27 MED ORDER — ADULT MULTIVITAMIN W/MINERALS CH
1.0000 | ORAL_TABLET | Freq: Every day | ORAL | Status: DC
  Administered 2021-12-27 – 2022-01-03 (×7): 1 via ORAL
  Filled 2021-12-27 (×8): qty 1

## 2021-12-27 MED ORDER — SODIUM CHLORIDE 0.9 % IV SOLN: INTRAVENOUS | Status: AC

## 2021-12-27 MED ORDER — ONDANSETRON HCL 4 MG/2ML IJ SOLN
4.0000 mg | Freq: Four times a day (QID) | INTRAMUSCULAR | Status: DC | PRN
Start: 2021-12-27 — End: 2022-01-03
  Administered 2021-12-29: 4 mg via INTRAVENOUS
  Filled 2021-12-27 (×3): qty 2

## 2021-12-27 MED ORDER — ACETAMINOPHEN 325 MG PO TABS
650.0000 mg | ORAL_TABLET | Freq: Four times a day (QID) | ORAL | Status: DC | PRN
  Administered 2021-12-27: 650 mg via ORAL
  Filled 2021-12-27: qty 2

## 2021-12-27 MED ORDER — TBO-FILGRASTIM 480 MCG/0.8ML ~~LOC~~ SOSY
480.0000 ug | PREFILLED_SYRINGE | Freq: Every day | SUBCUTANEOUS | Status: DC
  Administered 2021-12-27 – 2021-12-28 (×2): 480 ug via SUBCUTANEOUS
  Filled 2021-12-27 (×2): qty 0.8

## 2021-12-27 NOTE — Plan of Care (Signed)
  Problem: Clinical Measurements: Goal: Diagnostic test results will improve Outcome: Progressing   

## 2021-12-27 NOTE — Progress Notes (Signed)
PROGRESS NOTE    Cheryl Singh  YBO:[id number] DOB: 06-21-1951 DOA: 12/26/2021 PCP: Glenis Smoker, MD   Brief Narrative:   71 y.o. female with medical history significant of hyperlipidemia, hypertension, anemia, metastatic bladder cancer, recent admission and discharge from 11/12/2021-11/23/2021 for acute colitis treated with IV and subsequently oral antibiotics presented with worsening left-sided abdominal pain.  On presentation, she was found to be pancytopenic.  CT of the abdomen/pelvis showed prominent long-segment circumferential thickening of the rectosigmoid colon compatible with acute colitis.  She was started on IV antibiotics.  Oncology was consulted.  Assessment & Plan:   Recurrent acute colitis -Patient was hospitalized last month for colitis and treated with IV and subsequently oral antibiotics. -Presented again with worsening left-sided abdominal pain: Imaging findings as above. -Currently on Zosyn.  Having watery diarrhea.  Stool for C. difficile pending. -Currently on clear liquid diet since this morning as per oncology. -Doubt that patient has bowel obstruction as patient is having diarrhea -Continue IV fluids  Pancytopenia History of metastatic bladder cancer currently undergoing chemotherapy -Oncology following.  Patient received 2 units packed red cells on 12/26/2021.  Hemoglobin 9.2 today.  Oncology has ordered 2 blood filgrastim.  Oncology wants to hold off on platelet transfusion today.  Repeat a.m. labs. -Neutropenic precautions  Hyponatremia--resolved  Hyperlipidemia -Statin on hold  Hypertension -Monitor blood pressure.    DVT prophylaxis: SCDs Code Status: Full Family Communication: None at bedside Disposition Plan: Status is: Inpatient Remains inpatient appropriate because: Of severity of illness    Consultants: Oncology  Procedures: None  Antimicrobials: Zosyn from 12/26/2021 onwards   Subjective: Patient seen and examined at  bedside.  Still complains of lower abdominal pain with diarrhea.  Denies any current nausea or vomiting.  No fever reported.  Objective: Vitals:   12/26/21 2043 12/26/21 2121 12/27/21 0024 12/27/21 0543  BP: (!) 159/82 140/75 (!) 146/78 (!) 136/105  Pulse: 96 (!) 106 95 82  Resp: '18 20 16 18  '$ Temp: 97.8 F (36.6 C) 99.6 F (37.6 C) 99.5 F (37.5 C) 99.7 F (37.6 C)  TempSrc: Oral Oral Oral Oral  SpO2: 100% 94% 100% 99%  Weight:      Height:        Intake/Output Summary (Last 24 hours) at 12/27/2021 1128 Last data filed at 12/27/2021 0919 Gross per 24 hour  Intake 1543.52 ml  Output 1 ml  Net 1542.52 ml   Filed Weights   12/26/21 1102 12/26/21 1818  Weight: 68 kg 63.5 kg    Examination:  General exam: Appears calm and comfortable.  Looks chronically ill and deconditioned.  Currently on room air. Respiratory system: Bilateral decreased breath sounds at bases Cardiovascular system: S1 & S2 heard, Rate controlled Gastrointestinal system: Abdomen is distended, soft and mildly tender in the lower quadrant. Normal bowel sounds heard. Extremities: No cyanosis, clubbing; trace lower extremity edema present Central nervous system: Alert and oriented. No focal neurological deficits. Moving extremities Skin: No rashes, lesions or ulcers Psychiatry: Affect is flat.  No signs of agitation    Data Reviewed: I have personally reviewed following labs and imaging studies  CBC: Recent Labs  Lab 12/20/21 1256 12/26/21 1208 12/26/21 1600 12/27/21 0253  WBC 2.9* 0.4* 0.5* 0.9*  NEUTROABS 2.6  --   --   --   HGB 9.0* 6.2* 7.0* 9.2*  HCT 27.6* 19.2* 21.3* 26.6*  MCV 96.8 96.5 96.4 90.2  PLT 94* 13* 12* 10*   Basic Metabolic Panel: Recent Labs  Lab  12/20/21 1256 12/26/21 1208 12/26/21 1600 12/27/21 0253  NA 132* 132* 132* 136  K 3.9 4.0 3.8 3.8  CL 99 102 103 103  CO2 26 23 20* 24  GLUCOSE 187* 120* 111* 93  BUN '20 14 15 15  '$ CREATININE 0.66 0.79 0.56 0.70  CALCIUM 8.3*  7.1* 7.1* 6.9*   GFR: Estimated Creatinine Clearance: 57.7 mL/min (by C-G formula based on SCr of 0.7 mg/dL). Liver Function Tests: Recent Labs  Lab 12/20/21 1256 12/26/21 1208 12/26/21 1600 12/27/21 0253  AST 13* 9* 8* 9*  ALT '20 8 9 8  '$ ALKPHOS 124 96 99 84  BILITOT 0.4 0.5 0.8 1.0  PROT 5.5* 5.2* 5.5* 4.9*  ALBUMIN 2.9* 2.0* 2.2* 2.0*   No results for input(s): "LIPASE", "AMYLASE" in the last 168 hours. No results for input(s): "AMMONIA" in the last 168 hours. Coagulation Profile: No results for input(s): "INR", "PROTIME" in the last 168 hours. Cardiac Enzymes: No results for input(s): "CKTOTAL", "CKMB", "CKMBINDEX", "TROPONINI" in the last 168 hours. BNP (last 3 results) No results for input(s): "PROBNP" in the last 8760 hours. HbA1C: No results for input(s): "HGBA1C" in the last 72 hours. CBG: No results for input(s): "GLUCAP" in the last 168 hours. Lipid Profile: No results for input(s): "CHOL", "HDL", "LDLCALC", "TRIG", "CHOLHDL", "LDLDIRECT" in the last 72 hours. Thyroid Function Tests: No results for input(s): "TSH", "T4TOTAL", "FREET4", "T3FREE", "THYROIDAB" in the last 72 hours. Anemia Panel: No results for input(s): "VITAMINB12", "FOLATE", "FERRITIN", "TIBC", "IRON", "RETICCTPCT" in the last 72 hours. Sepsis Labs: Recent Labs  Lab 12/26/21 1208 12/26/21 1405  LATICACIDVEN 1.4 1.4    Recent Results (from the past 240 hour(s))  Blood culture (routine x 2)     Status: None (Preliminary result)   Collection Time: 12/26/21 12:08 PM   Specimen: BLOOD  Result Value Ref Range Status   Specimen Description   Final    BLOOD RIGHT ANTECUBITAL Performed at [facility] [address], [place], [place] [address]    Special Requests   Final    BOTTLES DRAWN AEROBIC AND ANAEROBIC Blood Culture adequate volume Performed at [facility] [address] [place], [place] [address]    Culture   Final    NO GROWTH < 24  HOURS Performed at [facility] [address], [place], [place] [address]    Report Status PENDING  Incomplete         Radiology Studies: CT ABDOMEN PELVIS W CONTRAST  Result Date: 12/26/2021 CLINICAL DATA:  Abdominal pain.  History of bladder cancer EXAM: CT ABDOMEN AND PELVIS WITH CONTRAST TECHNIQUE: Multidetector CT imaging of the abdomen and pelvis was performed using the standard protocol following bolus administration of intravenous contrast. RADIATION DOSE REDUCTION: This exam was performed according to the departmental dose-optimization program which includes automated exposure control, adjustment of the mA and/or kV according to patient size and/or use of iterative reconstruction technique. CONTRAST:  166m OMNIPAQUE IOHEXOL 300 MG/ML  SOLN COMPARISON:  CT 11/12/2021 FINDINGS: Lower chest: No acute abnormality. Hepatobiliary: Approximally 1.0 cm hypodense lesion within the right hepatic lobe (series 2, image 18), unchanged from prior. No new focal liver lesion is identified. Gallbladder within normal limits. No biliary dilatation. Pancreas: Unremarkable. No pancreatic ductal dilatation or surrounding inflammatory changes. Spleen: Normal in size without focal abnormality. Adrenals/Urinary Tract: Unremarkable adrenal glands. Kidneys enhance symmetrically. No renal stone or hydronephrosis. Diffusely thickened appearance of the urinary bladder with left-sided mucosal enhancement compatible with history  of known bladder carcinoma. Stomach/Bowel: Prominent long segment circumferential thickening of the rectosigmoid colon. Slight interval decrease in the degree of pericolonic fat stranding. Numerous diverticula throughout the affected segment. Multiple mildly prominent fluid-filled loops of small bowel throughout the abdomen measuring up to 2.9 cm in diameter. No discrete transition point. Normal appendix in the right lower quadrant. Vascular/Lymphatic: Aortic atherosclerosis. No enlarged  abdominal or pelvic lymph nodes. Reproductive: Uterus and bilateral adnexa are unremarkable. Other: No ascites. No pneumoperitoneum. Fat containing umbilical hernia. Musculoskeletal: Multifocal sites of osseous metastatic disease throughout the included spine, ribs, pelvis, and bilateral femurs. No definite interval progression from prior. Subacute appearing bilateral sacral fractures (series 4, image 64), which could represent insufficiency fractures or pathologic fractures related to underlying metastatic disease. IMPRESSION: 1. Prominent long segment circumferential thickening of the rectosigmoid colon compatible with acute colitis. Slight interval decrease in the degree of pericolonic fat stranding. 2. Multiple mildly prominent fluid-filled loops of small bowel throughout the abdomen without discrete transition point. Findings may represent ileus versus developing small bowel obstruction. 3. Diffuse osseous metastatic disease without definite interval progression from prior. 4. Subacute-appearing bilateral sacral fractures which could represent insufficiency fractures or pathologic fractures related to underlying metastatic disease. 5. No apparent progression in hepatic metastatic disease. Aortic Atherosclerosis (ICD10-I70.0). Electronically Signed   By: Davina Poke D.O.   On: 12/26/2021 13:46        Scheduled Meds:  Chlorhexidine Gluconate Cloth  6 each Topical Daily   Tbo-Filgrastim  480 mcg Subcutaneous q1800   Continuous Infusions:  piperacillin-tazobactam (ZOSYN)  IV 3.375 g (12/27/21 0919)          Aline August, MD [facility] 12/27/2021, 11:28 AM

## 2021-12-27 NOTE — Progress Notes (Signed)
  Transition of Care [facility]) Screening Note   Patient Details  Name: ANTOINETTA BERRONES Date of Birth: 04-Feb-1951   Transition of Care [facility]) CM/SW Contact:    Dessa Phi, RN Phone Number: 12/27/2021, 12:14 PM    Transition of Care Department [facility]) has reviewed patient and no TOC needs have been identified at this time. We will continue to monitor patient advancement through interdisciplinary progression rounds. If new patient transition needs arise, please place a TOC consult.

## 2021-12-27 NOTE — Consult Note (Signed)
Referral MD  Reason for Referral: Recurrent colitis; metastatic bladder cancer  No chief complaint on file. : I saw him about abdominal pain again.  HPI: Cheryl Singh is a very nice 71 year old white female.  She has metastatic bladder cancer.  She has extensive bony metastasis.  She had liver metastasis.  We had her on systemic chemotherapy with cisplatin/gemcitabine.  She had a fourth cycle of chemotherapy back about 2 weeks ago.  She now comes in with another bout of colitis.  She was hospitalized with colitis I think back in May.  She has began to have abdominal pain again.  She does have some diarrhea.  When she came in, her white count 0.5.  Hemoglobin 7.  Platelet count 12,000.  I think she got transfused.  Her white cell count today is 0.9.  Hemoglobin 9.2.  Platelet count 10,000.  Her albumin is only 2.0.  She has CT scan done yesterday.  This showed prominent long segment circumferential thickening of the rectosigmoid colon.  She also may have a may have had an ileus.  She had osseous metastatic disease.  There was no progression of any hepatic metastasis.  Clearly, she is not going any further chemotherapy.  Have to believe that she has responded.  I think we probably had her set up another scan to see how things looked.  She is going need to have Neupogen.  Again I think she was transfused.  She wants to have some date.  I think we have to get her on clear liquids.  She has had no obvious bleeding.  There is no fever.  Currently, I would say performance status is ECOG 1.     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 ([place]) 08/16/2021   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 ([place]) 08/16/2021   Goals of care, counseling/discussion 08/16/2021   History of radiation therapy    lumbar spine, left ribs  09/18/2021-10/02/2021  Dr Gery Pray    Hypertension   :   Past Surgical History:  Procedure Laterality Date   HEART CHAMBER REVISION     IR IMAGING GUIDED PORT INSERTION  08/20/2021   TRANSURETHRAL RESECTION OF BLADDER TUMOR Bilateral 07/17/2021   Procedure: CYSTOSCOPY BILATERAL RETROGRADES TRANSURETHRAL RESECTION OF BLADDER TUMOR (TURBT);  Surgeon: Remi Haggard, MD;  Location: [facility];  Service: Urology;  Laterality: Bilateral;  :   Current Facility-Administered Medications:    Chlorhexidine Gluconate Cloth 2 % PADS 6 each, 6 each, Topical, Daily, Volanda Napoleon, MD, 6 each at 12/26/21 2114   morphine (PF) 2 MG/ML injection 2 mg, 2 mg, Intravenous, Q2H PRN, Ulyess Blossom C, MD, 2 mg at 12/27/21 0138   piperacillin-tazobactam (ZOSYN) IVPB 3.375 g, 3.375 g, Intravenous, Q8H, Ulyess Blossom C, MD, Last Rate: 12.5 mL/hr at 12/27/21 0142, 3.375 g at 12/27/21 0142   sodium chloride flush (NS) 0.9 % injection 10-40 mL, 10-40 mL, Intracatheter, PRN, Volanda Napoleon, MD  Facility-Administered Medications Ordered in Other Encounters:    sodium chloride flush (NS) 0.9 % injection 10 mL, 10 mL, Intracatheter, PRN, Volanda Napoleon, MD, 10 mL at 12/11/21 1554:   Chlorhexidine Gluconate Cloth  6 each Topical Daily  :  No Known Allergies:  History reviewed. No pertinent family history.:   Social History   Socioeconomic History   Marital status: Single    Spouse name: Not on  file   Number of children: Not on file   Years of education: Not on file   Highest education level: Not on file  Occupational History   Not on file  Tobacco Use   Smoking status: Every Day    Packs/day: 0.50    Years: 40.00    Total pack years: 20.00    Types: Cigarettes   Smokeless tobacco: Never  Vaping Use   Vaping Use: Never used  Substance and Sexual Activity   Alcohol use: Yes    Comment: occ   Drug use: No   Sexual activity: Not on file  Other Topics Concern   Not on file  Social History Narrative   Not on file   Social  Determinants of Health   Financial Resource Strain: Not on file  Food Insecurity: Not on file  Transportation Needs: Not on file  Physical Activity: Not on file  Stress: Not on file  Social Connections: Not on file  Intimate Partner Violence: Not on file  :  Pertinent items are noted in HPI.  Exam: Patient Vitals for the past 24 hrs:  BP Temp Temp src Pulse Resp SpO2 Height Weight  12/27/21 0543 (!) 136/105 99.7 F (37.6 C) Oral 82 18 99 % -- --  12/27/21 0024 (!) 146/78 99.5 F (37.5 C) Oral 95 16 100 % -- --  12/26/21 2121 140/75 99.6 F (37.6 C) Oral (!) 106 20 94 % -- --  12/26/21 2043 (!) 159/82 97.8 F (36.6 C) Oral 96 18 100 % -- --  12/26/21 1915 137/67 98.7 F (37.1 C) Oral 99 20 100 % -- --  12/26/21 1818 140/87 98 F (36.7 C) Oral 93 18 98 % 5' 4.5" (1.638 m) 139 lb 15.9 oz (63.5 kg)  12/26/21 1715 114/66 -- -- (!) 113 (!) 21 98 % -- --  12/26/21 1700 (!) 143/74 -- -- (!) 106 (!) 25 97 % -- --  12/26/21 1645 127/67 -- -- (!) 112 18 97 % -- --  12/26/21 1630 (!) 142/89 -- -- (!) 114 17 99 % -- --  12/26/21 1600 (!) 151/75 -- -- (!) 113 17 95 % -- --  12/26/21 1530 (!) 150/72 -- -- (!) 105 (!) 21 93 % -- --  12/26/21 1522 (!) 142/77 99.2 F (37.3 C) Oral -- -- -- -- --  12/26/21 1500 122/85 98.5 F (36.9 C) Oral (!) 112 19 98 % -- --  12/26/21 1430 130/89 -- -- (!) 111 16 98 % -- --  12/26/21 1415 130/74 -- -- (!) 113 19 99 % -- --  12/26/21 1400 121/81 -- -- (!) 115 (!) 22 98 % -- --  12/26/21 1341 (!) 148/69 -- -- -- (!) 21 -- -- --  12/26/21 1330 (!) 110/99 -- -- (!) 115 (!) 23 98 % -- --  12/26/21 1215 135/71 -- -- (!) 122 (!) 27 97 % -- --  12/26/21 1200 129/80 -- -- (!) 120 (!) 22 96 % -- --  12/26/21 1102 -- -- -- -- -- -- '5\' 4"'$  (1.626 m) 150 lb (68 kg)  12/26/21 1101 (!) 127/97 99 F (37.2 C) Oral (!) 130 (!) 25 98 % -- --   Physical Exam    Recent Labs    12/26/21 1600 12/27/21 0253  WBC 0.5* 0.9*  HGB 7.0* 9.2*  HCT 21.3* 26.6*  PLT 12*  10*    Recent Labs    12/26/21 1600 12/27/21 0253  NA 132* 136  K 3.8 3.8  CL 103 103  CO2 20* 24  GLUCOSE 111* 93  BUN 15 15  CREATININE 0.56 0.70  CALCIUM 7.1* 6.9*    Blood smear review: None  Pathology: None    Assessment and Plan: Cheryl Singh is a 71 year old white female.  She has metastatic urothelial carcinoma.  She has had 4 cycles of chemotherapy with cisplatin/gemcitabine.  She comes in with another episode of colitis.  Again, this might be from chemotherapy.  If it is, this is a very rare complication.  She is on IV antibiotics right now.  We will see what her C. difficile comes back.  She clearly is pancytopenic from chemotherapy.  She is not bleeding.  We will have to hold on platelets.  Again she must of gotten blood yesterday.  I will put her on Neupogen.  This will hopefully get her white cell count more quickly so she can heal up from this colitis.  I do not know if she is have a colonoscopy done.  I do not think this would be done given her low blood counts right now.  We are not going to give her any further chemotherapy.  I think we are going to treat this cancer, we could certainly consider immunotherapy.  She does look pretty good.  The pain is a real problem.  Hopefully clear liquids will be tolerated.  We will follow her along.  We will have to watch her blood counts closely.  It would not surprise me if she needs to be transfused again.  I know she will get incredible care from all the staff on 5 E.  Lattie Haw, MD  [facility]

## 2021-12-27 NOTE — Progress Notes (Signed)
Initial Nutrition Assessment  DOCUMENTATION CODES:   Not applicable  INTERVENTION:  - will order Boost Breeze TID, each supplement provides 250 kcal and 9 grams of protein.  - will order 1 tablet multivitamin with minerals/day.  - diet advancement as medically feasible.  - complete NFPE when feasible.   NUTRITION DIAGNOSIS:   Inadequate oral intake related to acute illness, decreased appetite as evidenced by per patient/family report.  GOAL:   Patient will meet greater than or equal to 90% of their needs  MONITOR:   PO intake, Supplement acceptance, Diet advancement, Labs, Weight trends  REASON FOR ASSESSMENT:   Malnutrition Screening Tool  ASSESSMENT:   71 y.o. female with medical history of HLD, HTN, anemia, metastatic bladder cancer, admission 5/15-5/26 for acute colitis treated with IV and subsequently oral abx. She presented to the ED due to worsening L-sided abdominal pain. She was found to have pancytopenia and CT abdomen/pelvis showed prominent thickening of rectosigmoid colon and acute colitis. She was started on IV abx. Oncology consulted.  Patient was seen by a RD on 5/24.  Diet advanced to CLD today at 0721 and she consumed 25% of breakfast and 0% of lunch. Patient shares that she had broth for breakfast and it did not agree with her. She shares that ginger ale has been well tolerated.   She had received Boost Breeze during previous admission but does not recall this supplement; agreeable to trying them this admission while on CLD.  She shares that since d/c from previous admission she has had ongoing abdominal discomfort and pain with PO intakes. She has done best with softer foods like tender meats, potatoes, and eggs. Due to pain, she is sometimes scared to eat.   Weight yesterday was 140 lb and weight on 4/25 was 156 lb. This indicates 16 lb weight loss (10% body weight) in the past 2 months; significant for time frame.  She had met criteria for moderate  malnutrition during previous admission; suspect this is ongoing.    Labs reviewed; Ca: 6.9 mg/dl. Medications reviewed. IVF; NS @ 75 ml/hr.     NUTRITION - FOCUSED PHYSICAL EXAM:  Requested to defer.  Diet Order:   Diet Order             Diet clear liquid Room service appropriate? Yes; Fluid consistency: Thin  Diet effective now                   EDUCATION NEEDS:   Not appropriate for education at this time  Skin:  Skin Assessment: Reviewed RN Assessment  Last BM:  6/28 (type 7 x1, medium amount)  Height:   Ht Readings from Last 1 Encounters:  12/26/21 5' 4.5" (1.638 m)    Weight:   Wt Readings from Last 1 Encounters:  12/26/21 63.5 kg     BMI:  Body mass index is 23.66 kg/m.  Estimated Nutritional Needs:  Kcal:  1800-2000 kcal Protein:  90-100 grams Fluid:  >/= 2 L/day     Jarome Matin, MS, RD, LDN, CNSC Registered Dietitian II Inpatient Clinical Nutrition RD pager # and on-call/weekend pager # available in [facility]

## 2021-12-28 DIAGNOSIS — K5190 Ulcerative colitis, unspecified, without complications: Secondary | ICD-10-CM | POA: Diagnosis not present

## 2021-12-28 DIAGNOSIS — E785 Hyperlipidemia, unspecified: Secondary | ICD-10-CM | POA: Diagnosis not present

## 2021-12-28 DIAGNOSIS — D61818 Other pancytopenia: Secondary | ICD-10-CM

## 2021-12-28 DIAGNOSIS — K529 Noninfective gastroenteritis and colitis, unspecified: Secondary | ICD-10-CM | POA: Diagnosis not present

## 2021-12-28 DIAGNOSIS — C679 Malignant neoplasm of bladder, unspecified: Secondary | ICD-10-CM

## 2021-12-28 DIAGNOSIS — C787 Secondary malignant neoplasm of liver and intrahepatic bile duct: Secondary | ICD-10-CM

## 2021-12-28 DIAGNOSIS — I10 Essential (primary) hypertension: Secondary | ICD-10-CM

## 2021-12-28 DIAGNOSIS — A4901 Methicillin susceptible Staphylococcus aureus infection, unspecified site: Secondary | ICD-10-CM | POA: Diagnosis not present

## 2021-12-28 DIAGNOSIS — R197 Diarrhea, unspecified: Secondary | ICD-10-CM | POA: Diagnosis not present

## 2021-12-28 LAB — CBC WITH DIFFERENTIAL/PLATELET
Abs Immature Granulocytes: 0.3 10*3/uL — ABNORMAL HIGH (ref 0.00–0.07)
Band Neutrophils: 3 %
Basophils Absolute: 0 10*3/uL (ref 0.0–0.1)
Basophils Relative: 0 %
Eosinophils Absolute: 0 10*3/uL (ref 0.0–0.5)
Eosinophils Relative: 1 %
HCT: 26.2 % — ABNORMAL LOW (ref 36.0–46.0)
Hemoglobin: 9.1 g/dL — ABNORMAL LOW (ref 12.0–15.0)
Lymphocytes Relative: 21 %
Lymphs Abs: 0.3 10*3/uL — ABNORMAL LOW (ref 0.7–4.0)
MCH: 31.7 pg (ref 26.0–34.0)
MCHC: 34.7 g/dL (ref 30.0–36.0)
MCV: 91.3 fL (ref 80.0–100.0)
Metamyelocytes Relative: 3 %
Monocytes Absolute: 0.3 10*3/uL (ref 0.1–1.0)
Monocytes Relative: 16 %
Myelocytes: 17 %
Neutro Abs: 0.7 10*3/uL — ABNORMAL LOW (ref 1.7–7.7)
Neutrophils Relative %: 39 %
Platelets: 15 10*3/uL — CL (ref 150–400)
RBC: 2.87 MIL/uL — ABNORMAL LOW (ref 3.87–5.11)
RDW: 17.2 % — ABNORMAL HIGH (ref 11.5–15.5)
WBC: 1.6 10*3/uL — ABNORMAL LOW (ref 4.0–10.5)
nRBC: 1.3 % — ABNORMAL HIGH (ref 0.0–0.2)

## 2021-12-28 LAB — BLOOD CULTURE ID PANEL (REFLEXED) - BCID2

## 2021-12-28 LAB — MAGNESIUM: Magnesium: 1.1 mg/dL — ABNORMAL LOW (ref 1.7–2.4)

## 2021-12-28 LAB — COMPREHENSIVE METABOLIC PANEL
ALT: 9 U/L (ref 0–44)
AST: 10 U/L — ABNORMAL LOW (ref 15–41)
Albumin: 2 g/dL — ABNORMAL LOW (ref 3.5–5.0)
Alkaline Phosphatase: 84 U/L (ref 38–126)
Anion gap: 9 (ref 5–15)
BUN: 10 mg/dL (ref 8–23)
CO2: 22 mmol/L (ref 22–32)
Calcium: 6.8 mg/dL — ABNORMAL LOW (ref 8.9–10.3)
Chloride: 105 mmol/L (ref 98–111)
Creatinine, Ser: 0.56 mg/dL (ref 0.44–1.00)
GFR, Estimated: >60 mL/min (ref >60)
Glucose, Bld: 91 mg/dL (ref 70–99)
Potassium: 2.3 mmol/L — CL (ref 3.5–5.1)
Sodium: 136 mmol/L (ref 135–145)
Total Bilirubin: 0.8 mg/dL (ref 0.3–1.2)
Total Protein: 4.8 g/dL — ABNORMAL LOW (ref 6.5–8.1)

## 2021-12-28 LAB — CBC
HCT: 19.2 % — ABNORMAL LOW (ref 36.0–46.0)
Hemoglobin: 6.2 g/dL — CL (ref 12.0–15.0)
MCH: 31.2 pg (ref 26.0–34.0)
MCHC: 32.3 g/dL (ref 30.0–36.0)
MCV: 96.5 fL (ref 80.0–100.0)
Platelets: 13 10*3/uL — CL (ref 150–400)
RBC: 1.99 MIL/uL — ABNORMAL LOW (ref 3.87–5.11)
RDW: 17.6 % — ABNORMAL HIGH (ref 11.5–15.5)
WBC: 0.4 10*3/uL — CL (ref 4.0–10.5)
nRBC: 5.6 % — ABNORMAL HIGH (ref 0.0–0.2)

## 2021-12-28 MED ORDER — CEFEPIME HCL 2 G IV SOLR
2.0000 g | Freq: Two times a day (BID) | INTRAVENOUS | Status: DC
  Administered 2021-12-28: 2 g via INTRAVENOUS
  Filled 2021-12-28 (×2): qty 12.5

## 2021-12-28 MED ORDER — MAGNESIUM SULFATE 4 GM/100ML IV SOLN
4.0000 g | Freq: Once | INTRAVENOUS | Status: AC
Start: 2021-12-28 — End: 2021-12-28
  Administered 2021-12-28: 4 g via INTRAVENOUS
  Filled 2021-12-28: qty 100

## 2021-12-28 MED ORDER — POTASSIUM CHLORIDE 10 MEQ/100ML IV SOLN
10.0000 meq | INTRAVENOUS | Status: AC
  Administered 2021-12-28 (×3): 10 meq via INTRAVENOUS
  Filled 2021-12-28 (×3): qty 100

## 2021-12-28 MED ORDER — VANCOMYCIN HCL IN DEXTROSE 1-5 GM/200ML-% IV SOLN
1000.0000 mg | INTRAVENOUS | Status: DC
  Administered 2021-12-29: 1000 mg via INTRAVENOUS
  Filled 2021-12-28: qty 200

## 2021-12-28 MED ORDER — POTASSIUM CHLORIDE CRYS ER 20 MEQ PO TBCR
40.0000 meq | EXTENDED_RELEASE_TABLET | ORAL | Status: AC
  Administered 2021-12-28 (×2): 40 meq via ORAL
  Filled 2021-12-28 (×2): qty 2

## 2021-12-28 MED ORDER — POTASSIUM CHLORIDE 20 MEQ PO PACK: 40.0000 meq | PACK | Freq: Three times a day (TID) | ORAL | Status: DC

## 2021-12-28 MED ORDER — METRONIDAZOLE 500 MG/100ML IV SOLN
500.0000 mg | Freq: Two times a day (BID) | INTRAVENOUS | Status: DC
Start: 2021-12-28 — End: 2022-01-02
  Administered 2021-12-28 – 2022-01-02 (×11): 500 mg via INTRAVENOUS
  Filled 2021-12-28 (×11): qty 100

## 2021-12-28 MED ORDER — VANCOMYCIN HCL 1250 MG/250ML IV SOLN
1250.0000 mg | Freq: Once | INTRAVENOUS | Status: AC
  Administered 2021-12-28: 1250 mg via INTRAVENOUS
  Filled 2021-12-28: qty 250

## 2021-12-28 MED ORDER — SODIUM CHLORIDE 0.9 % IV SOLN: INTRAVENOUS | Status: AC

## 2021-12-28 MED ORDER — SODIUM CHLORIDE 0.9 % IV SOLN
2.0000 g | Freq: Two times a day (BID) | INTRAVENOUS | Status: DC
  Administered 2021-12-29 – 2022-01-02 (×9): 2 g via INTRAVENOUS
  Filled 2021-12-28 (×10): qty 12.5

## 2021-12-28 NOTE — Progress Notes (Signed)
PHARMACY - PHYSICIAN COMMUNICATION CRITICAL VALUE ALERT - BLOOD CULTURE IDENTIFICATION (BCID)  Cheryl Singh is an 71 y.o. female who presented to [facility] on 12/26/2021 with a chief complaint of recurrent colitis  Assessment:  1 of 3 bottles, pediatric aerobic bottle with GPC = staph species. Most likely a contaminant  Name of physician (or Provider) Contacted: T. Opyd  Current antibiotics: Zosyn  Changes to prescribed antibiotics recommended:  Recommendations accepted by provider. No change to antibiotics  Results for orders placed or performed during the hospital encounter of 12/26/21  Blood Culture ID Panel (Reflexed) (Collected: 12/27/2021  2:53 AM)  Result Value Ref Range   Enterococcus faecalis NOT DETECTED NOT DETECTED   Enterococcus Faecium NOT DETECTED NOT DETECTED   Listeria monocytogenes NOT DETECTED NOT DETECTED   Staphylococcus species DETECTED (A) NOT DETECTED   Staphylococcus aureus (BCID) NOT DETECTED NOT DETECTED   Staphylococcus epidermidis NOT DETECTED NOT DETECTED   Staphylococcus lugdunensis NOT DETECTED NOT DETECTED   Streptococcus species NOT DETECTED NOT DETECTED   Streptococcus agalactiae NOT DETECTED NOT DETECTED   Streptococcus pneumoniae NOT DETECTED NOT DETECTED   Streptococcus pyogenes NOT DETECTED NOT DETECTED   A.calcoaceticus-baumannii NOT DETECTED NOT DETECTED   Bacteroides fragilis NOT DETECTED NOT DETECTED   Enterobacterales NOT DETECTED NOT DETECTED   Enterobacter cloacae complex NOT DETECTED NOT DETECTED   Escherichia coli NOT DETECTED NOT DETECTED   Klebsiella aerogenes NOT DETECTED NOT DETECTED   Klebsiella oxytoca NOT DETECTED NOT DETECTED   Klebsiella pneumoniae NOT DETECTED NOT DETECTED   Proteus species NOT DETECTED NOT DETECTED   Salmonella species NOT DETECTED NOT DETECTED   Serratia marcescens NOT DETECTED NOT DETECTED   Haemophilus influenzae NOT DETECTED NOT DETECTED   Neisseria meningitidis NOT DETECTED NOT DETECTED    Pseudomonas aeruginosa NOT DETECTED NOT DETECTED   Stenotrophomonas maltophilia NOT DETECTED NOT DETECTED   Candida albicans NOT DETECTED NOT DETECTED   Candida auris NOT DETECTED NOT DETECTED   Candida glabrata NOT DETECTED NOT DETECTED   Candida krusei NOT DETECTED NOT DETECTED   Candida parapsilosis NOT DETECTED NOT DETECTED   Candida tropicalis NOT DETECTED NOT DETECTED   Cryptococcus neoformans/gattii NOT DETECTED NOT DETECTED    Eudelia Bunch, Pharm.D 12/28/2021 2:39 AM

## 2021-12-28 NOTE — Progress Notes (Signed)
PROGRESS NOTE    Cheryl Singh  DJS:[id number] DOB: 02/24/1951 DOA: 12/26/2021 PCP: Glenis Smoker, MD   Brief Narrative:   71 y.o. female with medical history significant of hyperlipidemia, hypertension, anemia, metastatic bladder cancer, recent admission and discharge from 11/12/2021-11/23/2021 for acute colitis treated with IV and subsequently oral antibiotics presented with worsening left-sided abdominal pain.  On presentation, she was found to be pancytopenic.  CT of the abdomen/pelvis showed prominent long-segment circumferential thickening of the rectosigmoid colon compatible with acute colitis.  She was started on IV antibiotics.  Oncology was consulted.  Assessment & Plan:   Recurrent acute colitis -Patient was hospitalized last month for colitis and treated with IV and subsequently oral antibiotics. -Presented again with worsening left-sided abdominal pain: Imaging findings as above. -Currently on Zosyn.  Having watery diarrhea.  Stool for C. difficile negative. -Currently on clear liquid diet.  Advance diet if tolerated -Doubt that patient has bowel obstruction as patient is having diarrhea -Continue IV fluids  Staphylococcus bacteremia -1 out of 3 bottles; mostly contaminant.  Repeat blood cultures in AM.  Pancytopenia History of metastatic bladder cancer currently undergoing chemotherapy -Oncology following.  Patient received 2 units packed red cells on 12/26/2021.  Hemoglobin 9.1 today.  Patient is on Granix as per oncology.  WBC 1.6 today.  Platelets 15 today.  Repeat a.m. labs. -Neutropenic precautions  Hyponatremia--resolved  Hypokalemia -Replace.  Repeat a.m. labs  Hypomagnesemia -Replace.  Repeat a.m. labs  Hyperlipidemia -Statin on hold  Hypertension -Monitor blood pressure.    DVT prophylaxis: SCDs Code Status: Full Family Communication: None at bedside Disposition Plan: Status is: Inpatient Remains inpatient appropriate because: Of  severity of illness    Consultants: Oncology  Procedures: None  Antimicrobials: Zosyn from 12/26/2021 onwards   Subjective: Patient seen and examined at bedside.  Still having diarrhea but improving.  Lower abdominal pain is improving but still has intermittent significant abdominal pain.  Tolerating clear liquid diet.  No fever, chest pain or shortness of breath reported.  Objective: Vitals:   12/27/21 1837 12/27/21 2248 12/28/21 0500 12/28/21 0602  BP: (!) 144/72 123/73  (!) 153/76  Pulse: 82 83  61  Resp: '18 18  20  '$ Temp: 98.7 F (37.1 C) 97.9 F (36.6 C)  98.8 F (37.1 C)  TempSrc: Oral Oral  Oral  SpO2: 100% 100%  99%  Weight:   64 kg   Height:        Intake/Output Summary (Last 24 hours) at 12/28/2021 0750 Last data filed at 12/28/2021 0117 Gross per 24 hour  Intake 1362.75 ml  Output 1 ml  Net 1361.75 ml    Filed Weights   12/26/21 1818 12/27/21 1530 12/28/21 0500  Weight: 63.5 kg 62.7 kg 64 kg    Examination:  General: On room air.  No distress ENT/neck: No thyromegaly.  JVD is not elevated  respiratory: Decreased breath sounds at bases bilaterally with some crackles; no wheezing  CVS: S1-S2 heard, rate controlled currently Abdominal: Soft, still tender in the lower quadrant, slightly distended; no organomegaly, bowel sounds are heard Extremities: Trace lower extremity edema; no cyanosis  CNS: Awake and alert.  No focal neurologic deficit.  Moves extremities Lymph: No obvious lymphadenopathy Skin: No obvious ecchymosis/lesions  psych: Affect, judgment and mood are normal  musculoskeletal: No obvious joint swelling/deformity     Data Reviewed: I have personally reviewed following labs and imaging studies  CBC: Recent Labs  Lab 12/26/21 1208 12/26/21 1600 12/27/21 0253 12/28/21 0349  WBC 0.4* 0.5* 0.9* 1.6*  NEUTROABS  --   --   --  0.7*  HGB 6.2* 7.0* 9.2* 9.1*  HCT 19.2* 21.3* 26.6* 26.2*  MCV 96.5 96.4 90.2 91.3  PLT 13* 12* 10* 15*     Basic Metabolic Panel: Recent Labs  Lab 12/26/21 1208 12/26/21 1600 12/27/21 0253 12/28/21 0349  NA 132* 132* 136 136  K 4.0 3.8 3.8 2.3*  CL 102 103 103 105  CO2 23 20* 24 22  GLUCOSE 120* 111* 93 91  BUN '14 15 15 10  '$ CREATININE 0.79 0.56 0.70 0.56  CALCIUM 7.1* 7.1* 6.9* 6.8*  MG  --   --   --  1.1*    GFR: Estimated Creatinine Clearance: 57.7 mL/min (by C-G formula based on SCr of 0.56 mg/dL). Liver Function Tests: Recent Labs  Lab 12/26/21 1208 12/26/21 1600 12/27/21 0253 12/28/21 0349  AST 9* 8* 9* 10*  ALT '8 9 8 9  '$ ALKPHOS 96 99 84 84  BILITOT 0.5 0.8 1.0 0.8  PROT 5.2* 5.5* 4.9* 4.8*  ALBUMIN 2.0* 2.2* 2.0* 2.0*    No results for input(s): "LIPASE", "AMYLASE" in the last 168 hours. No results for input(s): "AMMONIA" in the last 168 hours. Coagulation Profile: No results for input(s): "INR", "PROTIME" in the last 168 hours. Cardiac Enzymes: No results for input(s): "CKTOTAL", "CKMB", "CKMBINDEX", "TROPONINI" in the last 168 hours. BNP (last 3 results) No results for input(s): "PROBNP" in the last 8760 hours. HbA1C: No results for input(s): "HGBA1C" in the last 72 hours. CBG: No results for input(s): "GLUCAP" in the last 168 hours. Lipid Profile: No results for input(s): "CHOL", "HDL", "LDLCALC", "TRIG", "CHOLHDL", "LDLDIRECT" in the last 72 hours. Thyroid Function Tests: No results for input(s): "TSH", "T4TOTAL", "FREET4", "T3FREE", "THYROIDAB" in the last 72 hours. Anemia Panel: No results for input(s): "VITAMINB12", "FOLATE", "FERRITIN", "TIBC", "IRON", "RETICCTPCT" in the last 72 hours. Sepsis Labs: Recent Labs  Lab 12/26/21 1208 12/26/21 1405  LATICACIDVEN 1.4 1.4     Recent Results (from the past 240 hour(s))  Blood culture (routine x 2)     Status: None (Preliminary result)   Collection Time: 12/26/21 12:08 PM   Specimen: BLOOD  Result Value Ref Range Status   Specimen Description   Final    BLOOD RIGHT ANTECUBITAL Performed at  [facility] [address] [place], [place] [address]    Special Requests   Final    BOTTLES DRAWN AEROBIC AND ANAEROBIC Blood Culture adequate volume Performed at [facility] [address], [place], [place] [address]    Culture   Final    NO GROWTH < 24 HOURS Performed at [facility] [address] [place], [place] [address]    Report Status PENDING  Incomplete  Blood culture (routine x 2)     Status: None (Preliminary result)   Collection Time: 12/27/21  2:53 AM   Specimen: BLOOD  Result Value Ref Range Status   Specimen Description   Final    BLOOD LEFT ANTECUBITAL Performed at [facility] [address] [place], [place] [address]    Special Requests   Final    IN PEDIATRIC BOTTLE Blood Culture results may not be optimal due to an inadequate volume of blood received in culture bottles Performed at [facility] [address], [place], [place] [address]    Culture  Setup Time   Final    GRAM POSITIVE COCCI AEROBIC  BOTTLE ONLY CRITICAL RESULT CALLED TO, READ BACK BY AND VERIFIED WITH: PHARMD MICHELLE BELL 12/28/21'@2'$ :31 BY TW Performed at [facility], [place] [address] [place], [place] [address]    Culture GRAM POSITIVE COCCI  Final   Report Status PENDING  Incomplete  Blood Culture ID Panel (Reflexed)     Status: Abnormal   Collection Time: 12/27/21  2:53 AM  Result Value Ref Range Status   Enterococcus faecalis NOT DETECTED NOT DETECTED Final   Enterococcus Faecium NOT DETECTED NOT DETECTED Final   Listeria monocytogenes NOT DETECTED NOT DETECTED Final   Staphylococcus species DETECTED (A) NOT DETECTED Final    Comment: CRITICAL RESULT CALLED TO, READ BACK BY AND VERIFIED WITH: PHARMD MICHELLE BELL 12/28/21'@2'$ :31 BY TW    Staphylococcus aureus (BCID) NOT DETECTED NOT DETECTED Final   Staphylococcus epidermidis NOT DETECTED NOT DETECTED Final   Staphylococcus lugdunensis NOT  DETECTED NOT DETECTED Final   Streptococcus species NOT DETECTED NOT DETECTED Final   Streptococcus agalactiae NOT DETECTED NOT DETECTED Final   Streptococcus pneumoniae NOT DETECTED NOT DETECTED Final   Streptococcus pyogenes NOT DETECTED NOT DETECTED Final   A.calcoaceticus-baumannii NOT DETECTED NOT DETECTED Final   Bacteroides fragilis NOT DETECTED NOT DETECTED Final   Enterobacterales NOT DETECTED NOT DETECTED Final   Enterobacter cloacae complex NOT DETECTED NOT DETECTED Final   Escherichia coli NOT DETECTED NOT DETECTED Final   Klebsiella aerogenes NOT DETECTED NOT DETECTED Final   Klebsiella oxytoca NOT DETECTED NOT DETECTED Final   Klebsiella pneumoniae NOT DETECTED NOT DETECTED Final   Proteus species NOT DETECTED NOT DETECTED Final   Salmonella species NOT DETECTED NOT DETECTED Final   Serratia marcescens NOT DETECTED NOT DETECTED Final   Haemophilus influenzae NOT DETECTED NOT DETECTED Final   Neisseria meningitidis NOT DETECTED NOT DETECTED Final   Pseudomonas aeruginosa NOT DETECTED NOT DETECTED Final   Stenotrophomonas maltophilia NOT DETECTED NOT DETECTED Final   Candida albicans NOT DETECTED NOT DETECTED Final   Candida auris NOT DETECTED NOT DETECTED Final   Candida glabrata NOT DETECTED NOT DETECTED Final   Candida krusei NOT DETECTED NOT DETECTED Final   Candida parapsilosis NOT DETECTED NOT DETECTED Final   Candida tropicalis NOT DETECTED NOT DETECTED Final   Cryptococcus neoformans/gattii NOT DETECTED NOT DETECTED Final    Comment: Performed at [facility], [place] [address], [place], [place] [address]  C Difficile Quick Screen w PCR reflex     Status: None   Collection Time: 12/27/21  2:17 PM   Specimen: STOOL  Result Value Ref Range Status   C Diff antigen NEGATIVE NEGATIVE Final   C Diff toxin NEGATIVE NEGATIVE Final   C Diff interpretation No C. difficile detected.  Final    Comment: Performed at [facility], [place] [address],  [place], [place] [address]         Radiology Studies: CT ABDOMEN PELVIS W CONTRAST  Result Date: 12/26/2021 CLINICAL DATA:  Abdominal pain.  History of bladder cancer EXAM: CT ABDOMEN AND PELVIS WITH CONTRAST TECHNIQUE: Multidetector CT imaging of the abdomen and pelvis was performed using the standard protocol following bolus administration of intravenous contrast. RADIATION DOSE REDUCTION: This exam was performed according to the departmental dose-optimization program which includes automated exposure control, adjustment of the mA and/or kV according to patient size and/or use of iterative reconstruction technique. CONTRAST:  125m OMNIPAQUE IOHEXOL 300 MG/ML  SOLN COMPARISON:  CT 11/12/2021 FINDINGS: Lower chest: No acute abnormality. Hepatobiliary: Approximally 1.0 cm  hypodense lesion within the right hepatic lobe (series 2, image 18), unchanged from prior. No new focal liver lesion is identified. Gallbladder within normal limits. No biliary dilatation. Pancreas: Unremarkable. No pancreatic ductal dilatation or surrounding inflammatory changes. Spleen: Normal in size without focal abnormality. Adrenals/Urinary Tract: Unremarkable adrenal glands. Kidneys enhance symmetrically. No renal stone or hydronephrosis. Diffusely thickened appearance of the urinary bladder with left-sided mucosal enhancement compatible with history of known bladder carcinoma. Stomach/Bowel: Prominent long segment circumferential thickening of the rectosigmoid colon. Slight interval decrease in the degree of pericolonic fat stranding. Numerous diverticula throughout the affected segment. Multiple mildly prominent fluid-filled loops of small bowel throughout the abdomen measuring up to 2.9 cm in diameter. No discrete transition point. Normal appendix in the right lower quadrant. Vascular/Lymphatic: Aortic atherosclerosis. No enlarged abdominal or pelvic lymph nodes. Reproductive: Uterus and bilateral adnexa are unremarkable. Other: No  ascites. No pneumoperitoneum. Fat containing umbilical hernia. Musculoskeletal: Multifocal sites of osseous metastatic disease throughout the included spine, ribs, pelvis, and bilateral femurs. No definite interval progression from prior. Subacute appearing bilateral sacral fractures (series 4, image 64), which could represent insufficiency fractures or pathologic fractures related to underlying metastatic disease. IMPRESSION: 1. Prominent long segment circumferential thickening of the rectosigmoid colon compatible with acute colitis. Slight interval decrease in the degree of pericolonic fat stranding. 2. Multiple mildly prominent fluid-filled loops of small bowel throughout the abdomen without discrete transition point. Findings may represent ileus versus developing small bowel obstruction. 3. Diffuse osseous metastatic disease without definite interval progression from prior. 4. Subacute-appearing bilateral sacral fractures which could represent insufficiency fractures or pathologic fractures related to underlying metastatic disease. 5. No apparent progression in hepatic metastatic disease. Aortic Atherosclerosis (ICD10-I70.0). Electronically Signed   By: Davina Poke D.O.   On: 12/26/2021 13:46        Scheduled Meds:  Chlorhexidine Gluconate Cloth  6 each Topical Daily   feeding supplement  1 Container Oral TID BM   multivitamin with minerals  1 tablet Oral Daily   potassium chloride  40 mEq Oral TID   Tbo-Filgrastim  480 mcg Subcutaneous q1800   Continuous Infusions:  magnesium sulfate bolus IVPB     piperacillin-tazobactam (ZOSYN)  IV 3.375 g (12/28/21 0118)   potassium chloride 10 mEq (12/28/21 0700)          Aline August, MD [facility] 12/28/2021, 7:50 AM

## 2021-12-28 NOTE — Progress Notes (Signed)
Pharmacy Antibiotic Note  Cheryl Singh is a 71 y.o. female admitted on 12/26/2021 with  abdominal pain .  Patient was recently admitted in May for colitis. Piperacillin/tazobactam was initiated on admission for IAI.   PMH significant for metastatic bladder cancer currently on chemotherapy - most recent dose of gemcitabine on 12/18/21, cisplatin on 12/11/21.  Blood cultures obtained on admission + 1/3 staph species, initially determined to likely be contaminant. Pharmacy consulted by oncology this morning to initiate vancomycin for concern for bacteremia with PAC. Recommended to repeat blood cultures prior to initiation of vancomycin and transition zosyn to cefepime/metronidazole for IAI coverage to reduce risk of AKI.   Today, 12/28/21 WBC/ANC low Afebrile SCr stable  Plan: D/c piperacillin/tazobactam. Start cefepime 2 g IV q12 + metronidazole 500 mg IV q12 Vancomycin 1250 mg IV LD followed by 1000 mg IV q24 for estimated AUC of 425 Goal vancomycin AUC 400-550. Check levels at steady state as needed Monitor renal function, culture data  Height: 5' 4.5" (163.8 cm) Weight: 64 kg (141 lb 1.5 oz) IBW/kg (Calculated) : 55.85  Temp (24hrs), Avg:98.5 F (36.9 C), Min:97.9 F (36.6 C), Max:98.8 F (37.1 C)  Recent Labs  Lab 12/26/21 1208 12/26/21 1405 12/26/21 1600 12/27/21 0253 12/28/21 0349  WBC 0.4*  --  0.5* 0.9* 1.6*  CREATININE 0.79  --  0.56 0.70 0.56  LATICACIDVEN 1.4 1.4  --   --   --     Estimated Creatinine Clearance: 57.7 mL/min (by C-G formula based on SCr of 0.56 mg/dL).    No Known Allergies  Antimicrobials this admission: Piperacillin/tazobactam 6/29 >> 6/30 vancomycin 6/30 >>  Cefepime/metronidazole 6/30 >>  Dose adjustments this admission:  Microbiology results: 6/30 repeat BCx: Sent 6/29 BCx: + 1/3 GPC, staph species. Identification pending  6/29 C.diff: Negative  Lenis Noon, PharmD 12/28/2021 9:47 AM

## 2021-12-28 NOTE — Plan of Care (Signed)

## 2021-12-28 NOTE — Progress Notes (Signed)
Overall, Cheryl Singh might be doing a little bit better.  She still having some pain.  There is still some diarrhea.  She would like to try full liquid diet.  I think that would be reasonable.  I noted that with her cultures, wound culture grew out Staph.  I am unsure if this was a contaminant.  We need to await the culture results.  I know she has the Port-A-Cath in.  Her potassium is quite low this morning at 2.3.  I think she is getting some supplemental potassium.  Her white blood cell count is now to 1.6.  Hemoglobin 9.1.  Platelet count 15,000.  Looks like her bone marrow might be recovering.  She has had no fever.  She has had no cough or shortness of breath.  She does have some palpitations.  She did have an EKG done yesterday.  It shows some sinus tachycardia with PACs.  There is been no pain outside of that with the abdomen.  She has had no problems urinating.  We will have to see what this Staph and the blood is all about.  I know she has a Port-A-Cath in.  However, I would not take the Port-A-Cath out.  I would get on empiric antibiotics.  I think she might be on Zosyn right now.  Maybe, we need to add vancomycin until we know if this is a contaminant.  She is still on the Granix.  I will keep her on Granix until her white blood cell count gets above 3000.  I do appreciate the incredible care that she is getting from all the staff on 4 E.  Lattie Haw, MD  Michaelyn Barter 4:32

## 2021-12-28 NOTE — TOC Initial Note (Signed)
Transition of Care [facility]) - Initial/Assessment Note    Patient Details  Name: LAVETTA GEIER MRN: [id number] Date of Birth: [birth year]  Transition of Care [facility]) CM/SW Contact:    Leeroy Cha, RN Phone Number: 12/28/2021, 8:52 AM  Clinical Narrative:                  Transition of Care [facility]) Screening Note   Patient Details  Name: GENEVRA ORNE Date of Birth: 1950/10/12   Transition of Care [facility]) CM/SW Contact:    Leeroy Cha, RN Phone Number: 12/28/2021, 8:52 AM    Transition of Care Department [facility]) has reviewed patient and no TOC needs have been identified at this time. We will continue to monitor patient advancement through interdisciplinary progression rounds. If new patient transition needs arise, please place a TOC consult.    Expected Discharge Plan: Home/Self Care Barriers to Discharge: Continued Medical Work up   Patient Goals and CMS Choice Patient states their goals for this hospitalization and ongoing recovery are:: to go home CMS Medicare.gov Compare Post Acute Care list provided to:: Patient Choice offered to / list presented to : Patient  Expected Discharge Plan and Services Expected Discharge Plan: Home/Self Care   Discharge Planning Services: CM Consult   Living arrangements for the past 2 months: Single Family Home                                      Prior Living Arrangements/Services Living arrangements for the past 2 months: Single Family Home Lives with:: Self Patient language and need for interpreter reviewed:: Yes Do you feel safe going back to the place where you live?: Yes            Criminal Activity/Legal Involvement Pertinent to Current Situation/Hospitalization: No - Comment as needed  Activities of Daily Living Home Assistive Devices/Equipment: Environmental consultant (specify type), Eyeglasses, Wheelchair, Raised toilet seat with rails ADL Screening (condition at time of admission) Patient's cognitive ability  adequate to safely complete daily activities?: Yes Is the patient deaf or have difficulty hearing?: No Does the patient have difficulty seeing, even when wearing glasses/contacts?: No Does the patient have difficulty concentrating, remembering, or making decisions?: No Patient able to express need for assistance with ADLs?: Yes Does the patient have difficulty dressing or bathing?: No Independently performs ADLs?: No Communication: Independent Dressing (OT): Independent Grooming: Independent Feeding: Independent Bathing: Independent Toileting: Needs assistance Is this a change from baseline?: Pre-admission baseline In/Out Bed: Needs assistance Is this a change from baseline?: Pre-admission baseline Walks in Home: Needs assistance Is this a change from baseline?: Pre-admission baseline Does the patient have difficulty walking or climbing stairs?: Yes Weakness of Legs: Both Weakness of Arms/Hands: Both  Permission Sought/Granted                  Emotional Assessment Appearance:: Appears stated age     Orientation: : Oriented to Situation, Oriented to  Time, Oriented to Place, Oriented to Self Alcohol / Substance Use: Not Applicable Psych Involvement: No (comment)  Admission diagnosis:  Colitis [K52.9] Pancytopenia ([place]) [D61.818] Abdominal pain [R10.9] Patient Active Problem List   Diagnosis Date Noted   Malnutrition of moderate degree 11/15/2021   Colitis 11/12/2021   Hypokalemia 11/12/2021   Premature atrial contraction 09/18/2021   Hypomagnesemia 09/18/2021   Acute respiratory failure with hypoxia ([place]) 09/16/2021   Generalized weakness 09/16/2021   Cancer related  pain 09/16/2021   Closed fracture of rib on left side 09/16/2021   Orthostatic hypotension 09/15/2021   UTI (urinary tract infection) 09/15/2021   Pancytopenia ([place]) 09/15/2021   Hypocalcemia 09/15/2021   HTN (hypertension) 09/15/2021   HLD (hyperlipidemia) 09/15/2021   GERD (gastroesophageal  reflux disease) 09/15/2021   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 ([place]) 08/16/2021   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 ([place]) 08/16/2021   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 ([place]) 08/16/2021   Goals of care, counseling/discussion 08/16/2021   PCP:  Glenis Smoker, MD Pharmacy:   [id number] [place]Phone: [phone]: [phone]    Social Determinants of Health (SDOH) Interventions    Readmission Risk Interventions    12/27/2021   12:15 PM  Readmission Risk Prevention Plan  Transportation Screening Complete  PCP or Specialist Appt within 3-5 Days Complete  [place] or [place]  Social Work Consult for [place]/Counseling Complete  Palliative Care Screening Not Applicable  Medication Review (Press photographer Complete

## 2021-12-29 DIAGNOSIS — R197 Diarrhea, unspecified: Secondary | ICD-10-CM | POA: Diagnosis not present

## 2021-12-29 DIAGNOSIS — I10 Essential (primary) hypertension: Secondary | ICD-10-CM | POA: Diagnosis not present

## 2021-12-29 DIAGNOSIS — C679 Malignant neoplasm of bladder, unspecified: Secondary | ICD-10-CM | POA: Diagnosis not present

## 2021-12-29 DIAGNOSIS — K529 Noninfective gastroenteritis and colitis, unspecified: Secondary | ICD-10-CM | POA: Diagnosis not present

## 2021-12-29 DIAGNOSIS — K5190 Ulcerative colitis, unspecified, without complications: Secondary | ICD-10-CM | POA: Diagnosis not present

## 2021-12-29 DIAGNOSIS — A4901 Methicillin susceptible Staphylococcus aureus infection, unspecified site: Secondary | ICD-10-CM | POA: Diagnosis not present

## 2021-12-29 DIAGNOSIS — D61818 Other pancytopenia: Secondary | ICD-10-CM | POA: Diagnosis not present

## 2021-12-29 LAB — BASIC METABOLIC PANEL
Anion gap: 8 (ref 5–15)
BUN: 7 mg/dL — ABNORMAL LOW (ref 8–23)
CO2: 21 mmol/L — ABNORMAL LOW (ref 22–32)
Calcium: 7.6 mg/dL — ABNORMAL LOW (ref 8.9–10.3)
Chloride: 108 mmol/L (ref 98–111)
Creatinine, Ser: 0.58 mg/dL (ref 0.44–1.00)
GFR, Estimated: >60 mL/min (ref >60)
Glucose, Bld: 72 mg/dL (ref 70–99)
Potassium: 3.3 mmol/L — ABNORMAL LOW (ref 3.5–5.1)
Sodium: 137 mmol/L (ref 135–145)

## 2021-12-29 LAB — CULTURE, BLOOD (ROUTINE X 2)

## 2021-12-29 LAB — MAGNESIUM: Magnesium: 1.7 mg/dL (ref 1.7–2.4)

## 2021-12-29 MED ORDER — POTASSIUM CHLORIDE 10 MEQ/100ML IV SOLN
10.0000 meq | INTRAVENOUS | Status: AC
  Administered 2021-12-29 (×3): 10 meq via INTRAVENOUS
  Filled 2021-12-29 (×3): qty 100

## 2021-12-29 NOTE — Progress Notes (Signed)
PROGRESS NOTE    Cheryl Singh  YPP:[id number] DOB: [birth year] DOA: 12/26/2021 PCP: Glenis Smoker, MD   Brief Narrative:   71 y.o. female with medical history significant of hyperlipidemia, hypertension, anemia, metastatic bladder cancer, recent admission and discharge from 11/12/2021-11/23/2021 for acute colitis treated with IV and subsequently oral antibiotics presented with worsening left-sided abdominal pain.  On presentation, she was found to be pancytopenic.  CT of the abdomen/pelvis showed prominent long-segment circumferential thickening of the rectosigmoid colon compatible with acute colitis.  She was started on IV antibiotics.  Oncology was consulted.  Assessment & Plan:   Recurrent acute colitis -Patient was hospitalized last month for colitis and treated with IV and subsequently oral antibiotics. -Presented again with worsening left-sided abdominal pain: Imaging findings as above. -Currently on cefepime and Flagyl.  Diarrhea improving.  Stool for C. difficile negative. -Currently on full liquid diet.  Advance diet to soft diet  Staphylococcus bacteremia -1 out of 3 bottles; mostly contaminant.  Patient was started on vancomycin on 12/28/2021 by oncology.  Repeat blood cultures from 12/28/2021 are negative so far.  If they remain negative, will possibly DC vancomycin in the next 24 hours.  Pancytopenia History of metastatic bladder cancer currently undergoing chemotherapy -Oncology following.  Patient received 2 units packed red cells on 12/26/2021.  Hemoglobin 9.8 today.  WBC 8 today.  Granix discontinued by oncology today.  Platelets 27 today.  Repeat a.m. labs.  Hyponatremia--resolved  Hypokalemia -Replace.  Repeat a.m. labs  Hypomagnesemia -Improved  Hyperlipidemia -Statin on hold  Hypertension -Monitor blood pressure.    DVT prophylaxis: SCDs Code Status: Full Family Communication: None at bedside Disposition Plan: Status is: Inpatient Remains  inpatient appropriate because: Of severity of illness    Consultants: Oncology  Procedures: None  Antimicrobials:  Anti-infectives (From admission, onward)    Start     Dose/Rate Route Frequency Ordered Stop   12/29/21 1200  vancomycin (VANCOCIN) IVPB 1000 mg/200 mL premix        1,000 mg 200 mL/hr over 60 Minutes Intravenous Every 24 hours 12/28/21 1052     12/29/21 0400  ceFEPIme (MAXIPIME) 2 g in sodium chloride 0.9 % 100 mL IVPB        2 g 200 mL/hr over 30 Minutes Intravenous Every 12 hours 12/28/21 2120     12/28/21 1400  ceFEPIme (MAXIPIME) 2 g in sodium chloride 0.9 % 100 mL IVPB  Status:  Discontinued        2 g 200 mL/hr over 30 Minutes Intravenous Every 12 hours 12/28/21 1049 12/28/21 2120   12/28/21 1400  metroNIDAZOLE (FLAGYL) IVPB 500 mg        500 mg 100 mL/hr over 60 Minutes Intravenous 2 times daily 12/28/21 1049     12/28/21 1145  vancomycin (VANCOREADY) IVPB 1250 mg/250 mL        1,250 mg 166.7 mL/hr over 90 Minutes Intravenous  Once 12/28/21 1048 12/28/21 1308   12/26/21 1800  piperacillin-tazobactam (ZOSYN) IVPB 4.5 g  Status:  Discontinued        4.5 g 200 mL/hr over 30 Minutes Intravenous Every 6 hours 12/26/21 1621 12/26/21 1623   12/26/21 1700  piperacillin-tazobactam (ZOSYN) IVPB 3.375 g  Status:  Discontinued        3.375 g 12.5 mL/hr over 240 Minutes Intravenous Every 8 hours 12/26/21 1624 12/28/21 0956   12/26/21 1315  ceFEPIme (MAXIPIME) 2 g in sodium chloride 0.9 % 100 mL IVPB  2 g 200 mL/hr over 30 Minutes Intravenous NOW 12/26/21 1313 12/26/21 1405   12/26/21 1315  metroNIDAZOLE (FLAGYL) IVPB 500 mg  Status:  Discontinued        500 mg 100 mL/hr over 60 Minutes Intravenous Every 12 hours 12/26/21 1313 12/26/21 1621        Subjective: Patient seen and examined at bedside.  Feels that her diarrhea is improving.  She is tolerating full liquid diet.  Has intermittent abdominal pain and nausea.  No overnight fever, shortness of  breath Objective: Vitals:   12/28/21 1356 12/28/21 2017 12/29/21 0358 12/29/21 0500  BP: (!) 141/78 131/75 116/78   Pulse: 87 85 91   Resp: (!) '24 18 17   '$ Temp: 98.2 F (36.8 C) 97.7 F (36.5 C) 97.7 F (36.5 C)   TempSrc: Oral Oral Oral   SpO2: 100% 98% 98%   Weight:    64.3 kg  Height:        Intake/Output Summary (Last 24 hours) at 12/29/2021 0756 Last data filed at 12/29/2021 0640 Gross per 24 hour  Intake 1681.69 ml  Output 450 ml  Net 1231.69 ml    Filed Weights   12/27/21 1530 12/28/21 0500 12/29/21 0500  Weight: 62.7 kg 64 kg 64.3 kg    Examination:  General: No acute distress.  Still on room air  ENT/neck: No palpable neck masses or JVD elevation noted  respiratory: Bilateral decreased breath sounds at bases with scattered crackles CVS: Currently rate controlled; S1-S2 are heard abdominal: Soft, mildly tender in the lower quadrant; slightly distended; no organomegaly, bowel sounds are heard normally  extremities: No clubbing.  Mild lower extremity edema present bilaterally  CNS: Alert and oriented.  No focal neurologic deficit.  Able to move extremities lymph: No obvious palpable lymphadenopathy noted  skin: No obvious rashes/petechiae  psych: Mostly flat affect.  No signs of agitation  musculoskeletal: No obvious joint swelling/deformity     Data Reviewed: I have personally reviewed following labs and imaging studies  CBC: Recent Labs  Lab 12/26/21 1208 12/26/21 1600 12/27/21 0253 12/28/21 0349 12/29/21 0346  WBC 0.4* 0.5* 0.9* 1.6* 8.0  NEUTROABS  --   --   --  0.7* 4.6  HGB 6.2* 7.0* 9.2* 9.1* 9.8*  HCT 19.2* 21.3* 26.6* 26.2* 29.3*  MCV 96.5 96.4 90.2 91.3 92.7  PLT 13* 12* 10* 15* 27*    Basic Metabolic Panel: Recent Labs  Lab 12/26/21 1208 12/26/21 1600 12/27/21 0253 12/28/21 0349 12/29/21 0346  NA 132* 132* 136 136 137  K 4.0 3.8 3.8 2.3* 3.3*  CL 102 103 103 105 108  CO2 23 20* 24 22 21*  GLUCOSE 120* 111* 93 91 72  BUN '14 15 15  10 '$ 7*  CREATININE 0.79 0.56 0.70 0.56 0.58  CALCIUM 7.1* 7.1* 6.9* 6.8* 7.6*  MG  --   --   --  1.1* 1.7    GFR: Estimated Creatinine Clearance: 57.7 mL/min (by C-G formula based on SCr of 0.58 mg/dL). Liver Function Tests: Recent Labs  Lab 12/26/21 1208 12/26/21 1600 12/27/21 0253 12/28/21 0349  AST 9* 8* 9* 10*  ALT '8 9 8 9  '$ ALKPHOS 96 99 84 84  BILITOT 0.5 0.8 1.0 0.8  PROT 5.2* 5.5* 4.9* 4.8*  ALBUMIN 2.0* 2.2* 2.0* 2.0*    No results for input(s): "LIPASE", "AMYLASE" in the last 168 hours. No results for input(s): "AMMONIA" in the last 168 hours. Coagulation Profile: No results for input(s): "INR", "PROTIME"  in the last 168 hours. Cardiac Enzymes: No results for input(s): "CKTOTAL", "CKMB", "CKMBINDEX", "TROPONINI" in the last 168 hours. BNP (last 3 results) No results for input(s): "PROBNP" in the last 8760 hours. HbA1C: No results for input(s): "HGBA1C" in the last 72 hours. CBG: No results for input(s): "GLUCAP" in the last 168 hours. Lipid Profile: No results for input(s): "CHOL", "HDL", "LDLCALC", "TRIG", "CHOLHDL", "LDLDIRECT" in the last 72 hours. Thyroid Function Tests: No results for input(s): "TSH", "T4TOTAL", "FREET4", "T3FREE", "THYROIDAB" in the last 72 hours. Anemia Panel: No results for input(s): "VITAMINB12", "FOLATE", "FERRITIN", "TIBC", "IRON", "RETICCTPCT" in the last 72 hours. Sepsis Labs: Recent Labs  Lab 12/26/21 1208 12/26/21 1405  LATICACIDVEN 1.4 1.4     Recent Results (from the past 240 hour(s))  Blood culture (routine x 2)     Status: None (Preliminary result)   Collection Time: 12/26/21 12:08 PM   Specimen: BLOOD  Result Value Ref Range Status   Specimen Description   Final    BLOOD RIGHT ANTECUBITAL Performed at [facility] [address], [place], [place] [address]    Special Requests   Final    BOTTLES DRAWN AEROBIC AND ANAEROBIC Blood Culture adequate volume Performed at [facility] [address] [place], [place] [address]    Culture   Final    NO GROWTH 2 DAYS Performed at [facility] [address], [place], [place] [address]    Report Status PENDING  Incomplete  Blood culture (routine x 2)     Status: None (Preliminary result)   Collection Time: 12/27/21  2:53 AM   Specimen: BLOOD  Result Value Ref Range Status   Specimen Description   Final    BLOOD LEFT ANTECUBITAL Performed at [facility] [address], [place], [place] [address]    Special Requests   Final    IN PEDIATRIC BOTTLE Blood Culture results may not be optimal due to an inadequate volume of blood received in culture bottles Performed at [facility] [address], [place], [place] [address]    Culture  Setup Time   Final    GRAM POSITIVE COCCI AEROBIC BOTTLE ONLY CRITICAL RESULT CALLED TO, READ BACK BY AND VERIFIED WITH: PHARMD MICHELLE BELL 12/28/21'@2'$ :31 BY TW Performed at [facility], [place] [address], [place], [place] [address]    Culture GRAM POSITIVE COCCI  Final   Report Status PENDING  Incomplete  Blood Culture ID Panel (Reflexed)     Status: Abnormal   Collection Time: 12/27/21  2:53 AM  Result Value Ref Range Status   Enterococcus faecalis NOT DETECTED NOT DETECTED Final   Enterococcus Faecium NOT DETECTED NOT DETECTED Final   Listeria monocytogenes NOT DETECTED NOT DETECTED Final   Staphylococcus species DETECTED (A) NOT DETECTED Final    Comment: CRITICAL RESULT CALLED TO, READ BACK BY AND VERIFIED WITH: PHARMD MICHELLE BELL 12/28/21'@2'$ :31 BY TW    Staphylococcus aureus (BCID) NOT DETECTED NOT DETECTED Final   Staphylococcus epidermidis NOT DETECTED NOT DETECTED Final   Staphylococcus lugdunensis NOT DETECTED NOT DETECTED Final   Streptococcus species NOT DETECTED NOT DETECTED Final   Streptococcus agalactiae NOT DETECTED NOT DETECTED Final   Streptococcus pneumoniae NOT DETECTED NOT DETECTED Final    Streptococcus pyogenes NOT DETECTED NOT DETECTED Final   A.calcoaceticus-baumannii NOT DETECTED NOT DETECTED Final   Bacteroides fragilis NOT DETECTED NOT DETECTED Final   Enterobacterales NOT DETECTED NOT DETECTED Final  Enterobacter cloacae complex NOT DETECTED NOT DETECTED Final   Escherichia coli NOT DETECTED NOT DETECTED Final   Klebsiella aerogenes NOT DETECTED NOT DETECTED Final   Klebsiella oxytoca NOT DETECTED NOT DETECTED Final   Klebsiella pneumoniae NOT DETECTED NOT DETECTED Final   Proteus species NOT DETECTED NOT DETECTED Final   Salmonella species NOT DETECTED NOT DETECTED Final   Serratia marcescens NOT DETECTED NOT DETECTED Final   Haemophilus influenzae NOT DETECTED NOT DETECTED Final   Neisseria meningitidis NOT DETECTED NOT DETECTED Final   Pseudomonas aeruginosa NOT DETECTED NOT DETECTED Final   Stenotrophomonas maltophilia NOT DETECTED NOT DETECTED Final   Candida albicans NOT DETECTED NOT DETECTED Final   Candida auris NOT DETECTED NOT DETECTED Final   Candida glabrata NOT DETECTED NOT DETECTED Final   Candida krusei NOT DETECTED NOT DETECTED Final   Candida parapsilosis NOT DETECTED NOT DETECTED Final   Candida tropicalis NOT DETECTED NOT DETECTED Final   Cryptococcus neoformans/gattii NOT DETECTED NOT DETECTED Final    Comment: Performed at [facility], [place] [address] [place], [place] [address]  C Difficile Quick Screen w PCR reflex     Status: None   Collection Time: 12/27/21  2:17 PM   Specimen: STOOL  Result Value Ref Range Status   C Diff antigen NEGATIVE NEGATIVE Final   C Diff toxin NEGATIVE NEGATIVE Final   C Diff interpretation No C. difficile detected.  Final    Comment: Performed at [facility], [place] [address] [place],  [address]         Radiology Studies: No results found.      Scheduled Meds:  Chlorhexidine Gluconate Cloth  6 each Topical Daily   feeding supplement  1 Container Oral TID BM    multivitamin with minerals  1 tablet Oral Daily   Continuous Infusions:  ceFEPime (MAXIPIME) IV 2 g (12/29/21 0439)   metronidazole 500 mg (12/28/21 2120)   potassium chloride 10 mEq (12/29/21 0640)   vancomycin            Aline August, MD [facility] 12/29/2021, 7:56 AM

## 2021-12-29 NOTE — Progress Notes (Signed)
Cheryl Singh is feeling better.  She does not have as much abdominal pain.  She still has some diarrhea but this is also improving.  There was one positive blood culture for STAPH.  We are repeating this.  She has never had a temperature.  I realize she has the Port-A-Cath in place.  She is on broad coverage antibiotics right now.  She is on vancomycin.  She is on Maxipime and Flagyl.  She is not neutropenic now.  We will stop the Neupogen.  Her potassium is still little bit low at 3.3.  I would probably give her some more IV potassium.  She has had no bleeding.  She has had no cough or shortness of breath.  This colitis is improving.  So far, there is no evidence of C. difficile.  Hopefully, we can start narrowing the antibiotics even further.  If the cultures that were taken yesterday from her blood are negative, then I would probably stop the vancomycin.  I do appreciate the great care she is getting from all the staff on [address].  Lattie Haw, MD  Psalm 46:10

## 2021-12-29 NOTE — Evaluation (Signed)
Physical Therapy Evaluation Patient Details Name: Cheryl Singh MRN: [id number] DOB: 12-13-1950 Today's Date: 12/29/2021  History of Present Illness  71 yo female admitted with recurrent colitis, bacteremia. Hx of met bladder ca  Clinical Impression  On eval, pt was Supv for mobility. She was only agreeable to take a few steps in the room with a RW. She declined hallway ambulation on today. Pt reports she has been walking to/from bathroom on her own. Will plan to follow pt during this hospital stay. Do not anticipate any f/u PT needs at this time.        Recommendations for follow up therapy are one component of a multi-disciplinary discharge planning process, led by the attending physician.  Recommendations may be updated based on patient status, additional functional criteria and insurance authorization.  Follow Up Recommendations No PT follow up      Assistance Recommended at Discharge Intermittent Supervision/Assistance  Patient can return home with the following  Assist for transportation    Equipment Recommendations None recommended by PT  Recommendations for Other Services       Functional Status Assessment Patient has had a recent decline in their functional status and demonstrates the ability to make significant improvements in function in a reasonable and predictable amount of time.     Precautions / Restrictions Precautions Precautions: Fall Restrictions Weight Bearing Restrictions: No      Mobility  Bed Mobility Overal bed mobility: Needs Assistance Bed Mobility: Supine to Sit, Sit to Supine     Supine to sit: Modified independent (Device/Increase time) Sit to supine: Modified independent (Device/Increase time)   General bed mobility comments: pt used UEs to assist LEs onto bed. Increased time.    Transfers Overall transfer level: Needs assistance   Transfers: Sit to/from Stand Sit to Stand: Supervision           General transfer comment: Supv  for safety.    Ambulation/Gait Ambulation/Gait assistance: Supervision Gait Distance (Feet): 8 Feet           General Gait Details: Pt only agreeable to take a few steps in the room. She held onto furniture in the room to steady. She reports she has been walking to/from [facility] (Stroke Patients Only)       Balance Overall balance assessment: Needs assistance   Sitting balance-Leahy Scale: Good     Standing balance support: During functional activity Standing balance-Leahy Scale: Fair                               Pertinent Vitals/Pain Pain Assessment Pain Assessment: 0-10 Pain Score: 4  Pain Location: abdomen Pain Descriptors / Indicators: Discomfort, Sore Pain Intervention(s): Limited activity within patient's tolerance, Monitored during session    Home Living Family/patient expects to be discharged to:: Private residence Living Arrangements: Non-relatives/Friends Available Help at Discharge: Friend(s);Available PRN/intermittently Type of Home: House Home Access: Stairs to enter   [employer] of Steps: threshold            Prior Function Prior Level of Function : Independent/Modified Independent             Mobility Comments: patient reported using wheelchair for longer distances in community. patient used RW in house with some furniture walking at times ADLs Comments: patient reported she was bathing at sink while handicap shower was  being built. patient has caregiver at home with her 24/7     Hand Dominance        Extremity/Trunk Assessment   Upper Extremity Assessment Upper Extremity Assessment: Overall WFL for tasks assessed    Lower Extremity Assessment Lower Extremity Assessment: Generalized weakness    Cervical / Trunk Assessment Cervical / Trunk Assessment: Normal  Communication   Communication: No difficulties  Cognition Arousal/Alertness:  Awake/alert Behavior During Therapy: WFL for tasks assessed/performed Overall Cognitive Status: Within Functional Limits for tasks assessed                                          General Comments      Exercises     Assessment/Plan    PT Assessment Patient needs continued PT services  PT Problem List Decreased strength;Decreased balance;Decreased activity tolerance;Pain;Decreased mobility       PT Treatment Interventions DME instruction;Gait training;Functional mobility training;Therapeutic activities;Balance training;Patient/family education;Therapeutic exercise    PT Goals (Current goals can be found in the Care Plan section)  Acute Rehab PT Goals Patient Stated Goal: to feel better PT Goal Formulation: With patient Time For Goal Achievement: 01/12/22 Potential to Achieve Goals: Good    Frequency Min 3X/week     Co-evaluation               AM-PAC PT "6 Clicks" Mobility  Outcome Measure Help needed turning from your back to your side while in a flat bed without using bedrails?: None Help needed moving from lying on your back to sitting on the side of a flat bed without using bedrails?: None Help needed moving to and from a bed to a chair (including a wheelchair)?: None Help needed standing up from a chair using your arms (e.g., wheelchair or bedside chair)?: None Help needed to walk in hospital room?: A Little Help needed climbing 3-5 steps with a railing? : A Little 6 Click Score: 22    End of Session   Activity Tolerance: Patient tolerated treatment well Patient left: in bed;with call bell/phone within reach   PT Visit Diagnosis: Muscle weakness (generalized) (M62.81)    Time: 1431-1440 PT Time Calculation (min) (ACUTE ONLY): 9 min   Charges:   PT Evaluation $PT Eval Moderate Complexity: 1 Mod             Doreatha Massed, PT Acute Rehabilitation  Office: [phone] Pager: [phone]

## 2021-12-30 DIAGNOSIS — C679 Malignant neoplasm of bladder, unspecified: Secondary | ICD-10-CM | POA: Diagnosis not present

## 2021-12-30 DIAGNOSIS — C787 Secondary malignant neoplasm of liver and intrahepatic bile duct: Secondary | ICD-10-CM | POA: Diagnosis not present

## 2021-12-30 DIAGNOSIS — D61818 Other pancytopenia: Secondary | ICD-10-CM | POA: Diagnosis not present

## 2021-12-30 DIAGNOSIS — K529 Noninfective gastroenteritis and colitis, unspecified: Secondary | ICD-10-CM | POA: Diagnosis not present

## 2021-12-30 LAB — CBC WITH DIFFERENTIAL/PLATELET
Abs Immature Granulocytes: 3.43 10*3/uL — ABNORMAL HIGH (ref 0.00–0.07)
Basophils Absolute: 0 10*3/uL (ref 0.0–0.1)
Basophils Relative: 0 %
Eosinophils Absolute: 0.1 10*3/uL (ref 0.0–0.5)
Eosinophils Relative: 0 %
HCT: 27.3 % — ABNORMAL LOW (ref 36.0–46.0)
Hemoglobin: 9.1 g/dL — ABNORMAL LOW (ref 12.0–15.0)
Immature Granulocytes: 18 %
Lymphocytes Relative: 5 %
Lymphs Abs: 0.9 10*3/uL (ref 0.7–4.0)
MCH: 31.4 pg (ref 26.0–34.0)
MCHC: 33.3 g/dL (ref 30.0–36.0)
MCV: 94.1 fL (ref 80.0–100.0)
Monocytes Absolute: 3.4 10*3/uL — ABNORMAL HIGH (ref 0.1–1.0)
Monocytes Relative: 18 %
Neutro Abs: 11.5 10*3/uL — ABNORMAL HIGH (ref 1.7–7.7)
Neutrophils Relative %: 59 %
Platelets: 44 10*3/uL — ABNORMAL LOW (ref 150–400)
RBC: 2.9 MIL/uL — ABNORMAL LOW (ref 3.87–5.11)
RDW: 17.7 % — ABNORMAL HIGH (ref 11.5–15.5)
WBC: 19.3 10*3/uL — ABNORMAL HIGH (ref 4.0–10.5)
nRBC: 0.3 % — ABNORMAL HIGH (ref 0.0–0.2)

## 2021-12-30 LAB — BASIC METABOLIC PANEL
Anion gap: 7 (ref 5–15)
BUN: 6 mg/dL — ABNORMAL LOW (ref 8–23)
CO2: 22 mmol/L (ref 22–32)
Calcium: 8 mg/dL — ABNORMAL LOW (ref 8.9–10.3)
Chloride: 105 mmol/L (ref 98–111)
Creatinine, Ser: 0.6 mg/dL (ref 0.44–1.00)
GFR, Estimated: >60 mL/min (ref >60)
Glucose, Bld: 81 mg/dL (ref 70–99)
Potassium: 3.2 mmol/L — ABNORMAL LOW (ref 3.5–5.1)
Sodium: 134 mmol/L — ABNORMAL LOW (ref 135–145)

## 2021-12-30 LAB — MAGNESIUM: Magnesium: 1.5 mg/dL — ABNORMAL LOW (ref 1.7–2.4)

## 2021-12-30 MED ORDER — POTASSIUM CHLORIDE CRYS ER 20 MEQ PO TBCR
40.0000 meq | EXTENDED_RELEASE_TABLET | ORAL | Status: AC
  Administered 2021-12-30 (×2): 40 meq via ORAL
  Filled 2021-12-30 (×2): qty 2

## 2021-12-30 MED ORDER — MAGNESIUM SULFATE 2 GM/50ML IV SOLN
2.0000 g | Freq: Once | INTRAVENOUS | Status: AC
  Administered 2021-12-30: 2 g via INTRAVENOUS
  Filled 2021-12-30: qty 50

## 2021-12-30 MED ORDER — ORAL CARE MOUTH RINSE: 15.0000 mL | OROMUCOSAL | Status: DC | PRN

## 2021-12-30 MED ORDER — TRAMADOL HCL 50 MG PO TABS
50.0000 mg | ORAL_TABLET | Freq: Four times a day (QID) | ORAL | Status: DC | PRN
  Administered 2021-12-30 – 2021-12-31 (×2): 50 mg via ORAL
  Filled 2021-12-30 (×2): qty 1

## 2021-12-30 NOTE — Progress Notes (Signed)
PROGRESS NOTE    Cheryl Singh  GXQ:[id number] DOB: July 18, 1950 DOA: 12/26/2021 PCP: Glenis Smoker, MD   Brief Narrative:   71 y.o. female with medical history significant of hyperlipidemia, hypertension, anemia, metastatic bladder cancer, recent admission and discharge from 11/12/2021-11/23/2021 for acute colitis treated with IV and subsequently oral antibiotics presented with worsening left-sided abdominal pain.  On presentation, she was found to be pancytopenic.  CT of the abdomen/pelvis showed prominent long-segment circumferential thickening of the rectosigmoid colon compatible with acute colitis.  She was started on IV antibiotics.  Oncology was consulted.  Assessment & Plan:   Recurrent acute colitis -Patient was hospitalized last month for colitis and treated with IV and subsequently oral antibiotics. -Presented again with worsening left-sided abdominal pain: Imaging findings as above. -Currently on cefepime and Flagyl.  Diarrhea improving.  Stool for C. difficile negative. -Currently on soft diet.  Will advance to regular diet today.  Patient does not feel ready for discharge today.  Staphylococcus bacteremia -1 out of 3 bottles; mostly contaminant.  Repeat blood cultures are negative.  Vancomycin has been discontinued on 12/29/2021.  Pancytopenia History of metastatic bladder cancer currently undergoing chemotherapy -Oncology following.  Patient received 2 units packed red cells on 12/26/2021.  Hemoglobin 9.8 today.  WBC 19.3 today.  Granix discontinued by oncology on 12/29/2021.  Platelets 44 today.  Repeat a.m. labs.  Hyponatremia--mild.  Monitor   hypokalemia -Replace.  Repeat a.m. labs  Hypomagnesemia -Replace.  Repeat a.m. labs.  Hyperlipidemia -Statin on hold  Hypertension -Monitor blood pressure.    DVT prophylaxis: SCDs Code Status: Full Family Communication: None at bedside Disposition Plan: Status is: Inpatient Remains inpatient appropriate  because: Of severity of illness.  Possible discharge tomorrow if continues to improve.    Consultants: Oncology  Procedures: None  Antimicrobials:  Anti-infectives (From admission, onward)    Start     Dose/Rate Route Frequency Ordered Stop   12/29/21 1200  vancomycin (VANCOCIN) IVPB 1000 mg/200 mL premix  Status:  Discontinued        1,000 mg 200 mL/hr over 60 Minutes Intravenous Every 24 hours 12/28/21 1052 12/29/21 1350   12/29/21 0400  ceFEPIme (MAXIPIME) 2 g in sodium chloride 0.9 % 100 mL IVPB        2 g 200 mL/hr over 30 Minutes Intravenous Every 12 hours 12/28/21 2120     12/28/21 1400  ceFEPIme (MAXIPIME) 2 g in sodium chloride 0.9 % 100 mL IVPB  Status:  Discontinued        2 g 200 mL/hr over 30 Minutes Intravenous Every 12 hours 12/28/21 1049 12/28/21 2120   12/28/21 1400  metroNIDAZOLE (FLAGYL) IVPB 500 mg        500 mg 100 mL/hr over 60 Minutes Intravenous 2 times daily 12/28/21 1049     12/28/21 1145  vancomycin (VANCOREADY) IVPB 1250 mg/250 mL        1,250 mg 166.7 mL/hr over 90 Minutes Intravenous  Once 12/28/21 1048 12/28/21 1308   12/26/21 1800  piperacillin-tazobactam (ZOSYN) IVPB 4.5 g  Status:  Discontinued        4.5 g 200 mL/hr over 30 Minutes Intravenous Every 6 hours 12/26/21 1621 12/26/21 1623   12/26/21 1700  piperacillin-tazobactam (ZOSYN) IVPB 3.375 g  Status:  Discontinued        3.375 g 12.5 mL/hr over 240 Minutes Intravenous Every 8 hours 12/26/21 1624 12/28/21 0956   12/26/21 1315  ceFEPIme (MAXIPIME) 2 g in sodium chloride 0.9 % 100  mL IVPB        2 g 200 mL/hr over 30 Minutes Intravenous NOW 12/26/21 1313 12/26/21 1405   12/26/21 1315  metroNIDAZOLE (FLAGYL) IVPB 500 mg  Status:  Discontinued        500 mg 100 mL/hr over 60 Minutes Intravenous Every 12 hours 12/26/21 1313 12/26/21 1621        Subjective: Patient seen and examined at bedside.  Tolerating soft diet.  Still having intermittent abdominal pain and nausea.  Does not feel ready  to go home yet.  No overnight fever reported.  Diarrhea has much improved.   Objective: Vitals:   12/29/21 1255 12/29/21 2054 12/30/21 0500 12/30/21 0539  BP: 138/83 137/74  132/80  Pulse: 88 84  84  Resp: '14 20  17  '$ Temp: 97.9 F (36.6 C) 98.2 F (36.8 C)  98.2 F (36.8 C)  TempSrc: Oral Oral  Oral  SpO2: 100% 100%  96%  Weight:   64.5 kg   Height:        Intake/Output Summary (Last 24 hours) at 12/30/2021 1030 Last data filed at 12/30/2021 0600 Gross per 24 hour  Intake 740 ml  Output 200 ml  Net 540 ml    Filed Weights   12/28/21 0500 12/29/21 0500 12/30/21 0500  Weight: 64 kg 64.3 kg 64.5 kg    Examination:  General: On room air currently.  No distress.   ENT/neck: JVD not elevated.  No palpable thyromegaly noted  respiratory: Decreased breath sounds at bases bilaterally, no wheezing  CVS: S1 and S2 heard.  Rate controlled currently.   Abdominal: Soft, still slightly tender in the lower quadrant; slightly distended; no organomegaly, normal bowel sounds are heard  extremities: Trace lower extremity edema present; no cyanosis  CNS: Awake and alert.  No focal neurologic deficit.  Moving extremities  lymph: No palpable lymphadenopathy  skin: No obvious ecchymosis/lesions psych: Currently not agitated.  Affect is flat.   Musculoskeletal: No joint swelling/deformity/tenderness     Data Reviewed: I have personally reviewed following labs and imaging studies  CBC: Recent Labs  Lab 12/26/21 1600 12/27/21 0253 12/28/21 0349 12/29/21 0346 12/30/21 0348  WBC 0.5* 0.9* 1.6* 8.0 19.3*  NEUTROABS  --   --  0.7* 4.6 11.5*  HGB 7.0* 9.2* 9.1* 9.8* 9.1*  HCT 21.3* 26.6* 26.2* 29.3* 27.3*  MCV 96.4 90.2 91.3 92.7 94.1  PLT 12* 10* 15* 27* 44*    Basic Metabolic Panel: Recent Labs  Lab 12/26/21 1600 12/27/21 0253 12/28/21 0349 12/29/21 0346 12/30/21 0348  NA 132* 136 136 137 134*  K 3.8 3.8 2.3* 3.3* 3.2*  CL 103 103 105 108 105  CO2 20* 24 22 21* 22  GLUCOSE  111* 93 91 72 81  BUN '15 15 10 '$ 7* 6*  CREATININE 0.56 0.70 0.56 0.58 0.60  CALCIUM 7.1* 6.9* 6.8* 7.6* 8.0*  MG  --   --  1.1* 1.7 1.5*    GFR: Estimated Creatinine Clearance: 57.7 mL/min (by C-G formula based on SCr of 0.6 mg/dL). Liver Function Tests: Recent Labs  Lab 12/26/21 1208 12/26/21 1600 12/27/21 0253 12/28/21 0349  AST 9* 8* 9* 10*  ALT '8 9 8 9  '$ ALKPHOS 96 99 84 84  BILITOT 0.5 0.8 1.0 0.8  PROT 5.2* 5.5* 4.9* 4.8*  ALBUMIN 2.0* 2.2* 2.0* 2.0*    No results for input(s): "LIPASE", "AMYLASE" in the last 168 hours. No results for input(s): "AMMONIA" in the last 168 hours. Coagulation Profile:  No results for input(s): "INR", "PROTIME" in the last 168 hours. Cardiac Enzymes: No results for input(s): "CKTOTAL", "CKMB", "CKMBINDEX", "TROPONINI" in the last 168 hours. BNP (last 3 results) No results for input(s): "PROBNP" in the last 8760 hours. HbA1C: No results for input(s): "HGBA1C" in the last 72 hours. CBG: No results for input(s): "GLUCAP" in the last 168 hours. Lipid Profile: No results for input(s): "CHOL", "HDL", "LDLCALC", "TRIG", "CHOLHDL", "LDLDIRECT" in the last 72 hours. Thyroid Function Tests: No results for input(s): "TSH", "T4TOTAL", "FREET4", "T3FREE", "THYROIDAB" in the last 72 hours. Anemia Panel: No results for input(s): "VITAMINB12", "FOLATE", "FERRITIN", "TIBC", "IRON", "RETICCTPCT" in the last 72 hours. Sepsis Labs: Recent Labs  Lab 12/26/21 1208 12/26/21 1405  LATICACIDVEN 1.4 1.4     Recent Results (from the past 240 hour(s))  Blood culture (routine x 2)     Status: None (Preliminary result)   Collection Time: 12/26/21 12:08 PM   Specimen: BLOOD  Result Value Ref Range Status   Specimen Description   Final    BLOOD RIGHT ANTECUBITAL Performed at [facility] [address] [place], [place] [address]    Special Requests   Final    BOTTLES DRAWN AEROBIC AND ANAEROBIC Blood Culture adequate volume Performed  at [facility] [address] [place], [place] [address]    Culture   Final    NO GROWTH 4 DAYS Performed at [facility], [place] [address] [place], [place] [address]    Report Status PENDING  Incomplete  Blood culture (routine x 2)     Status: Abnormal   Collection Time: 12/27/21  2:53 AM   Specimen: BLOOD  Result Value Ref Range Status   Specimen Description   Final    BLOOD LEFT ANTECUBITAL Performed at [facility] [address] [place], [place] [address]    Special Requests   Final    IN PEDIATRIC BOTTLE Blood Culture results may not be optimal due to an inadequate volume of blood received in culture bottles Performed at [facility] [address], [place], [place] [address]    Culture  Setup Time   Final    GRAM POSITIVE COCCI AEROBIC BOTTLE ONLY CRITICAL RESULT CALLED TO, READ BACK BY AND VERIFIED WITH: PHARMD MICHELLE BELL 12/28/21'@2'$ :31 BY TW    Culture (A)  Final    STAPHYLOCOCCUS HOMINIS THE SIGNIFICANCE OF ISOLATING THIS ORGANISM FROM A SINGLE SET OF BLOOD CULTURES WHEN MULTIPLE SETS ARE DRAWN IS UNCERTAIN. PLEASE NOTIFY THE MICROBIOLOGY DEPARTMENT WITHIN ONE WEEK IF SPECIATION AND SENSITIVITIES ARE REQUIRED. Performed at [facility], [place] [address] [place], [place] [address]    Report Status 12/29/2021 FINAL  Final  Blood Culture ID Panel (Reflexed)     Status: Abnormal   Collection Time: 12/27/21  2:53 AM  Result Value Ref Range Status   Enterococcus faecalis NOT DETECTED NOT DETECTED Final   Enterococcus Faecium NOT DETECTED NOT DETECTED Final   Listeria monocytogenes NOT DETECTED NOT DETECTED Final   Staphylococcus species DETECTED (A) NOT DETECTED Final    Comment: CRITICAL RESULT CALLED TO, READ BACK BY AND VERIFIED WITH: PHARMD MICHELLE BELL 12/28/21'@2'$ :31 BY TW    Staphylococcus aureus (BCID) NOT DETECTED NOT DETECTED Final   Staphylococcus epidermidis NOT DETECTED NOT DETECTED  Final   Staphylococcus lugdunensis NOT DETECTED NOT DETECTED Final   Streptococcus species NOT DETECTED NOT DETECTED Final   Streptococcus agalactiae NOT DETECTED NOT DETECTED Final  Streptococcus pneumoniae NOT DETECTED NOT DETECTED Final   Streptococcus pyogenes NOT DETECTED NOT DETECTED Final   A.calcoaceticus-baumannii NOT DETECTED NOT DETECTED Final   Bacteroides fragilis NOT DETECTED NOT DETECTED Final   Enterobacterales NOT DETECTED NOT DETECTED Final   Enterobacter cloacae complex NOT DETECTED NOT DETECTED Final   Escherichia coli NOT DETECTED NOT DETECTED Final   Klebsiella aerogenes NOT DETECTED NOT DETECTED Final   Klebsiella oxytoca NOT DETECTED NOT DETECTED Final   Klebsiella pneumoniae NOT DETECTED NOT DETECTED Final   Proteus species NOT DETECTED NOT DETECTED Final   Salmonella species NOT DETECTED NOT DETECTED Final   Serratia marcescens NOT DETECTED NOT DETECTED Final   Haemophilus influenzae NOT DETECTED NOT DETECTED Final   Neisseria meningitidis NOT DETECTED NOT DETECTED Final   Pseudomonas aeruginosa NOT DETECTED NOT DETECTED Final   Stenotrophomonas maltophilia NOT DETECTED NOT DETECTED Final   Candida albicans NOT DETECTED NOT DETECTED Final   Candida auris NOT DETECTED NOT DETECTED Final   Candida glabrata NOT DETECTED NOT DETECTED Final   Candida krusei NOT DETECTED NOT DETECTED Final   Candida parapsilosis NOT DETECTED NOT DETECTED Final   Candida tropicalis NOT DETECTED NOT DETECTED Final   Cryptococcus neoformans/gattii NOT DETECTED NOT DETECTED Final    Comment: Performed at [facility], [address] [place], [place] [address]  C Difficile Quick Screen w PCR reflex     Status: None   Collection Time: 12/27/21  2:17 PM   Specimen: STOOL  Result Value Ref Range Status   C Diff antigen NEGATIVE NEGATIVE Final   C Diff toxin NEGATIVE NEGATIVE Final   C Diff interpretation No C. difficile detected.  Final    Comment: Performed at [facility], [place] [address], [place], [place] [address]  Culture, blood (Routine X 2) w Reflex to ID Panel     Status: None (Preliminary result)   Collection Time: 12/28/21 10:36 AM   Specimen: BLOOD  Result Value Ref Range Status   Specimen Description   Final    BLOOD LEFT ANTECUBITAL Performed at [facility] [address], [place], [place] [address]    Special Requests   Final    BOTTLES DRAWN AEROBIC ONLY Blood Culture adequate volume Performed at [facility] [address] [place], [place] [address]    Culture   Final    NO GROWTH 2 DAYS Performed at [facility] [address] [place], [place] [address]    Report Status PENDING  Incomplete  Culture, blood (Routine X 2) w Reflex to ID Panel     Status: None (Preliminary result)   Collection Time: 12/28/21 10:36 AM   Specimen: BLOOD LEFT HAND  Result Value Ref Range Status   Specimen Description   Final    BLOOD LEFT HAND Performed at [facility] [address] [place], [place] [address]    Special Requests   Final    IN PEDIATRIC BOTTLE Blood Culture adequate volume Performed at [facility] [address], [place], [place] [address]    Culture   Final    NO GROWTH 2 DAYS Performed at [facility] [address], [place], [place] [address]    Report Status PENDING  Incomplete         Radiology Studies: No results found.      Scheduled Meds:  Chlorhexidine Gluconate Cloth  6 each Topical Daily   feeding supplement  1 Container Oral TID BM   multivitamin with minerals  1 tablet Oral Daily   potassium chloride  40 mEq Oral Q4H   Continuous Infusions:  ceFEPime (MAXIPIME) IV 2 g (12/30/21 0331)   magnesium sulfate bolus IVPB     metronidazole 500 mg (12/30/21 0805)          Aline August, MD [facility] 12/30/2021, 10:30 AM

## 2021-12-31 ENCOUNTER — Inpatient Hospital Stay (HOSPITAL_COMMUNITY): Payer: Medicare PPO

## 2021-12-31 DIAGNOSIS — D61818 Other pancytopenia: Secondary | ICD-10-CM | POA: Diagnosis not present

## 2021-12-31 DIAGNOSIS — K529 Noninfective gastroenteritis and colitis, unspecified: Secondary | ICD-10-CM | POA: Diagnosis not present

## 2021-12-31 DIAGNOSIS — R197 Diarrhea, unspecified: Secondary | ICD-10-CM | POA: Diagnosis not present

## 2021-12-31 DIAGNOSIS — A4901 Methicillin susceptible Staphylococcus aureus infection, unspecified site: Secondary | ICD-10-CM | POA: Diagnosis not present

## 2021-12-31 DIAGNOSIS — C679 Malignant neoplasm of bladder, unspecified: Secondary | ICD-10-CM | POA: Diagnosis not present

## 2021-12-31 DIAGNOSIS — C787 Secondary malignant neoplasm of liver and intrahepatic bile duct: Secondary | ICD-10-CM | POA: Diagnosis not present

## 2021-12-31 DIAGNOSIS — K5190 Ulcerative colitis, unspecified, without complications: Secondary | ICD-10-CM | POA: Diagnosis not present

## 2021-12-31 LAB — CBC WITH DIFFERENTIAL/PLATELET
Abs Immature Granulocytes: 1.09 10*3/uL — ABNORMAL HIGH (ref 0.00–0.07)
Abs Immature Granulocytes: 4.53 10*3/uL — ABNORMAL HIGH (ref 0.00–0.07)
Basophils Absolute: 0 10*3/uL (ref 0.0–0.1)
Basophils Absolute: 0 10*3/uL (ref 0.0–0.1)
Basophils Relative: 0 %
Basophils Relative: 0 %
Eosinophils Absolute: 0 10*3/uL (ref 0.0–0.5)
Eosinophils Absolute: 0.1 10*3/uL (ref 0.0–0.5)
Eosinophils Relative: 1 %
Eosinophils Relative: 0 %
HCT: 29.3 % — ABNORMAL LOW (ref 36.0–46.0)
HCT: 27.3 % — ABNORMAL LOW (ref 36.0–46.0)
Hemoglobin: 9.8 g/dL — ABNORMAL LOW (ref 12.0–15.0)
Hemoglobin: 8.9 g/dL — ABNORMAL LOW (ref 12.0–15.0)
Immature Granulocytes: 14 %
Immature Granulocytes: 24 %
Lymphocytes Relative: 7 %
Lymphocytes Relative: 4 %
Lymphs Abs: 0.5 10*3/uL — ABNORMAL LOW (ref 0.7–4.0)
Lymphs Abs: 0.8 10*3/uL (ref 0.7–4.0)
MCH: 31 pg (ref 26.0–34.0)
MCH: 30.9 pg (ref 26.0–34.0)
MCHC: 33.4 g/dL (ref 30.0–36.0)
MCHC: 32.6 g/dL (ref 30.0–36.0)
MCV: 92.7 fL (ref 80.0–100.0)
MCV: 94.8 fL (ref 80.0–100.0)
Monocytes Absolute: 1.8 10*3/uL — ABNORMAL HIGH (ref 0.1–1.0)
Monocytes Absolute: 3.7 10*3/uL — ABNORMAL HIGH (ref 0.1–1.0)
Monocytes Relative: 22 %
Monocytes Relative: 19 %
Neutro Abs: 4.6 10*3/uL (ref 1.7–7.7)
Neutro Abs: 10.1 10*3/uL — ABNORMAL HIGH (ref 1.7–7.7)
Neutrophils Relative %: 56 %
Neutrophils Relative %: 53 %
Platelets: 27 10*3/uL — CL (ref 150–400)
Platelets: 57 10*3/uL — ABNORMAL LOW (ref 150–400)
RBC: 3.16 MIL/uL — ABNORMAL LOW (ref 3.87–5.11)
RBC: 2.88 MIL/uL — ABNORMAL LOW (ref 3.87–5.11)
RDW: 17.4 % — ABNORMAL HIGH (ref 11.5–15.5)
RDW: 17.7 % — ABNORMAL HIGH (ref 11.5–15.5)
WBC: 8 10*3/uL (ref 4.0–10.5)
WBC: 19.2 10*3/uL — ABNORMAL HIGH (ref 4.0–10.5)
nRBC: 0.5 % — ABNORMAL HIGH (ref 0.0–0.2)
nRBC: 0.1 % (ref 0.0–0.2)

## 2021-12-31 LAB — BASIC METABOLIC PANEL
Anion gap: 6 (ref 5–15)
BUN: <5 mg/dL — ABNORMAL LOW (ref 8–23)
CO2: 25 mmol/L (ref 22–32)
Calcium: 7.9 mg/dL — ABNORMAL LOW (ref 8.9–10.3)
Chloride: 103 mmol/L (ref 98–111)
Creatinine, Ser: 0.51 mg/dL (ref 0.44–1.00)
GFR, Estimated: >60 mL/min (ref >60)
Glucose, Bld: 84 mg/dL (ref 70–99)
Potassium: 4 mmol/L (ref 3.5–5.1)
Sodium: 134 mmol/L — ABNORMAL LOW (ref 135–145)

## 2021-12-31 LAB — CULTURE, BLOOD (ROUTINE X 2)
Culture: NO GROWTH
Special Requests: ADEQUATE

## 2021-12-31 LAB — MAGNESIUM: Magnesium: 1.7 mg/dL (ref 1.7–2.4)

## 2021-12-31 MED ORDER — FAMOTIDINE IN NACL 20-0.9 MG/50ML-% IV SOLN
20.0000 mg | Freq: Two times a day (BID) | INTRAVENOUS | Status: DC
  Administered 2021-12-31 – 2022-01-02 (×5): 20 mg via INTRAVENOUS
  Filled 2021-12-31 (×5): qty 50

## 2021-12-31 MED ORDER — LORAZEPAM 2 MG/ML IJ SOLN: 0.5000 mg | INTRAMUSCULAR | Status: DC | PRN

## 2021-12-31 NOTE — Progress Notes (Signed)
PT Cancellation Note  Patient Details Name: Cheryl Singh MRN: [id number] DOB: 1951/06/24   Cancelled Treatment:     Pt politely declined wanting to rest. Pt has been evaluated with rec of retuning home, NO post Acute PT needed.  Pt is amb around room, to and from bathroom with staff.  Will continue to follow and attempt to see another day as schedule permits.   Rica Koyanagi  PTA Acute  Rehabilitation Services Office M-F          [phone] Weekend pager [phone]

## 2021-12-31 NOTE — Care Management Important Message (Signed)
Important Message  Patient Details IM Letter given to the Patient. Name: Cheryl Singh MRN: [id number] Date of Birth: 09/09/1950   Medicare Important Message Given:  Yes     Cheryl, Singh 12/31/2021, 2:27 PM

## 2021-12-31 NOTE — Progress Notes (Signed)
She is not having pain in the left side of her abdomen.  When she came in with the pain, this does not appear to be present.  She was having some dry heaves this morning when I was with her.  This might be secondary to some anxiety.  I think Ativan may not be a bad idea for her.  She is on a soft diet.  I do not think this is a bad idea for her.  So far, cultures are really negative.  I think that Staphylococcus that she had was a contaminant.  I do not think she has had any fever.  Her labs show white cell count of 19.2.  Hemoglobin 8.9.  Platelet count 57,000.  Her sodium is 134.  Potassium 4.0.  BUN less than 5 creatinine 0.5.  Calcium is 7.9 with an albumin of 2.0.  Her white cell count elevation, I think is secondary to the bone marrow recovery from chemotherapy.  On exam, her vital signs show temperature of 97.9.  Pulse 85.  Blood pressure 152/82.  Her abdomen is soft.  Bowel sounds are hyperactive.  She has no obvious tenderness.  There may be a little bit of distention.  Lungs are clear.  Cardiac exam regular rate and rhythm.  [name] has another bout of colitis.  I had to believe this is improving.  I am not sure why she would have this pain over on the left side.  It would be worthwhile getting an abdominal film to see what might be going on.  Hopefully, we will be able to get her out at the hospital this week.  We will have to see what her labs look like.  Again she is tolerating a soft diet which is encouraging.  I do appreciate the incredible care that she is getting from the staff up on [address].  Cheryl Haw, MD  Proverbs 3:5-6

## 2021-12-31 NOTE — Progress Notes (Signed)
PROGRESS NOTE    Cheryl Singh  GUY:[id number] DOB: January 29, 1951 DOA: 12/26/2021 PCP: Glenis Smoker, MD   Brief Narrative:   71 y.o. female with medical history significant of hyperlipidemia, hypertension, anemia, metastatic bladder cancer, recent admission and discharge from 11/12/2021-11/23/2021 for acute colitis treated with IV and subsequently oral antibiotics presented with worsening left-sided abdominal pain.  On presentation, she was found to be pancytopenic.  CT of the abdomen/pelvis showed prominent long-segment circumferential thickening of the rectosigmoid colon compatible with acute colitis.  She was started on IV antibiotics.  Oncology was consulted.  Assessment & Plan:   Recurrent acute colitis -Patient was hospitalized last month for colitis and treated with IV and subsequently oral antibiotics. -Presented again with worsening left-sided abdominal pain: Imaging findings as above. -Currently on cefepime and Flagyl.  Diarrhea improving.  Stool for C. difficile negative. -Currently on regular diet.  Still having intermittent abdominal pain with nausea/retching and not feeling ready to go home today.  Staphylococcus bacteremia -1 out of 3 bottles; mostly contaminant.  Repeat blood cultures are negative.  Vancomycin has been discontinued on 12/29/2021.  Pancytopenia History of metastatic bladder cancer currently undergoing chemotherapy -Oncology following.  Patient received 2 units packed red cells on 12/26/2021.  Hemoglobin 9.8 today.  WBC 19.2 today.  Granix discontinued by oncology on 12/29/2021.  Platelets 57 today.  Repeat a.m. labs.  Hyponatremia--mild.  Monitor   hypokalemia -Resolved   hypomagnesemia -Improved  Hyperlipidemia -Statin on hold  Hypertension -Monitor blood pressure.    DVT prophylaxis: SCDs Code Status: Full Family Communication: None at bedside Disposition Plan: Status is: Inpatient Remains inpatient appropriate because: Of severity of  illness.  Possible discharge tomorrow if continues to improve.    Consultants: Oncology  Procedures: None  Antimicrobials:  Anti-infectives (From admission, onward)    Start     Dose/Rate Route Frequency Ordered Stop   12/29/21 1200  vancomycin (VANCOCIN) IVPB 1000 mg/200 mL premix  Status:  Discontinued        1,000 mg 200 mL/hr over 60 Minutes Intravenous Every 24 hours 12/28/21 1052 12/29/21 1350   12/29/21 0400  ceFEPIme (MAXIPIME) 2 g in sodium chloride 0.9 % 100 mL IVPB        2 g 200 mL/hr over 30 Minutes Intravenous Every 12 hours 12/28/21 2120     12/28/21 1400  ceFEPIme (MAXIPIME) 2 g in sodium chloride 0.9 % 100 mL IVPB  Status:  Discontinued        2 g 200 mL/hr over 30 Minutes Intravenous Every 12 hours 12/28/21 1049 12/28/21 2120   12/28/21 1400  metroNIDAZOLE (FLAGYL) IVPB 500 mg        500 mg 100 mL/hr over 60 Minutes Intravenous 2 times daily 12/28/21 1049     12/28/21 1145  vancomycin (VANCOREADY) IVPB 1250 mg/250 mL        1,250 mg 166.7 mL/hr over 90 Minutes Intravenous  Once 12/28/21 1048 12/28/21 1308   12/26/21 1800  piperacillin-tazobactam (ZOSYN) IVPB 4.5 g  Status:  Discontinued        4.5 g 200 mL/hr over 30 Minutes Intravenous Every 6 hours 12/26/21 1621 12/26/21 1623   12/26/21 1700  piperacillin-tazobactam (ZOSYN) IVPB 3.375 g  Status:  Discontinued        3.375 g 12.5 mL/hr over 240 Minutes Intravenous Every 8 hours 12/26/21 1624 12/28/21 0956   12/26/21 1315  ceFEPIme (MAXIPIME) 2 g in sodium chloride 0.9 % 100 mL IVPB  2 g 200 mL/hr over 30 Minutes Intravenous NOW 12/26/21 1313 12/26/21 1405   12/26/21 1315  metroNIDAZOLE (FLAGYL) IVPB 500 mg  Status:  Discontinued        500 mg 100 mL/hr over 60 Minutes Intravenous Every 12 hours 12/26/21 1313 12/26/21 1621        Subjective: Patient seen and examined at bedside.  Had lower abdominal pain with nausea/retching this morning.  Does not feel ready to go home today.  No overnight fever,  chest pain, shortness of breath reported. Objective: Vitals:   12/30/21 1426 12/30/21 2042 12/31/21 0500 12/31/21 0524  BP: (!) 146/88 (!) 146/99  (!) 152/82  Pulse: 81 78  85  Resp: '16 18  18  '$ Temp: 97.7 F (36.5 C) (!) 97.3 F (36.3 C)  97.9 F (36.6 C)  TempSrc: Oral Oral  Oral  SpO2: 99% 99%  99%  Weight:   64.2 kg   Height:        Intake/Output Summary (Last 24 hours) at 12/31/2021 1025 Last data filed at 12/31/2021 0904 Gross per 24 hour  Intake 871.02 ml  Output 1025 ml  Net -153.98 ml    Filed Weights   12/29/21 0500 12/30/21 0500 12/31/21 0500  Weight: 64.3 kg 64.5 kg 64.2 kg    Examination:  General: No acute distress.  Currently on room air.   ENT/neck: No obvious neck masses or JVD elevation noted  respiratory: Bilateral decreased breath sounds at bases, scattered crackles CVS: Rate is currently controlled; S1 and S2 are heard Abdominal: Soft, mild tender in the lower quadrant; slightly distended; no organomegaly, bowel sounds are heard normally  extremities: No clubbing; mild lower extremity edema present CNS: Alert and oriented.  No focal neurologic deficit.  Moving extremities  lymph: No obvious lymph nodes palpated  skin: No obvious rashes/lesions  psych: Affect, mood and judgment are normal Musculoskeletal: No joint swelling/deformity/tenderness     Data Reviewed: I have personally reviewed following labs and imaging studies  CBC: Recent Labs  Lab 12/27/21 0253 12/28/21 0349 12/29/21 0346 12/30/21 0348 12/31/21 0410  WBC 0.9* 1.6* 8.0 19.3* 19.2*  NEUTROABS  --  0.7* 4.6 11.5* 10.1*  HGB 9.2* 9.1* 9.8* 9.1* 8.9*  HCT 26.6* 26.2* 29.3* 27.3* 27.3*  MCV 90.2 91.3 92.7 94.1 94.8  PLT 10* 15* 27* 44* 57*    Basic Metabolic Panel: Recent Labs  Lab 12/27/21 0253 12/28/21 0349 12/29/21 0346 12/30/21 0348 12/31/21 0410  NA 136 136 137 134* 134*  K 3.8 2.3* 3.3* 3.2* 4.0  CL 103 105 108 105 103  CO2 24 22 21* 22 25  GLUCOSE 93 91 72 81  84  BUN 15 10 7* 6* <5*  CREATININE 0.70 0.56 0.58 0.60 0.51  CALCIUM 6.9* 6.8* 7.6* 8.0* 7.9*  MG  --  1.1* 1.7 1.5* 1.7    GFR: Estimated Creatinine Clearance: 57.7 mL/min (by C-G formula based on SCr of 0.51 mg/dL). Liver Function Tests: Recent Labs  Lab 12/26/21 1208 12/26/21 1600 12/27/21 0253 12/28/21 0349  AST 9* 8* 9* 10*  ALT '8 9 8 9  '$ ALKPHOS 96 99 84 84  BILITOT 0.5 0.8 1.0 0.8  PROT 5.2* 5.5* 4.9* 4.8*  ALBUMIN 2.0* 2.2* 2.0* 2.0*    No results for input(s): "LIPASE", "AMYLASE" in the last 168 hours. No results for input(s): "AMMONIA" in the last 168 hours. Coagulation Profile: No results for input(s): "INR", "PROTIME" in the last 168 hours. Cardiac Enzymes: No results for input(s): "  CKTOTAL", "CKMB", "CKMBINDEX", "TROPONINI" in the last 168 hours. BNP (last 3 results) No results for input(s): "PROBNP" in the last 8760 hours. HbA1C: No results for input(s): "HGBA1C" in the last 72 hours. CBG: No results for input(s): "GLUCAP" in the last 168 hours. Lipid Profile: No results for input(s): "CHOL", "HDL", "LDLCALC", "TRIG", "CHOLHDL", "LDLDIRECT" in the last 72 hours. Thyroid Function Tests: No results for input(s): "TSH", "T4TOTAL", "FREET4", "T3FREE", "THYROIDAB" in the last 72 hours. Anemia Panel: No results for input(s): "VITAMINB12", "FOLATE", "FERRITIN", "TIBC", "IRON", "RETICCTPCT" in the last 72 hours. Sepsis Labs: Recent Labs  Lab 12/26/21 1208 12/26/21 1405  LATICACIDVEN 1.4 1.4     Recent Results (from the past 240 hour(s))  Blood culture (routine x 2)     Status: None (Preliminary result)   Collection Time: 12/26/21 12:08 PM   Specimen: BLOOD  Result Value Ref Range Status   Specimen Description   Final    BLOOD RIGHT ANTECUBITAL Performed at [facility] [address], [place], [place] [address]    Special Requests   Final    BOTTLES DRAWN AEROBIC AND ANAEROBIC Blood Culture adequate volume Performed at [facility] [address], [place], [place] [address]    Culture   Final    NO GROWTH 4 DAYS Performed at [facility], [place] [address] [place], [place] [address]    Report Status PENDING  Incomplete  Blood culture (routine x 2)     Status: Abnormal   Collection Time: 12/27/21  2:53 AM   Specimen: BLOOD  Result Value Ref Range Status   Specimen Description   Final    BLOOD LEFT ANTECUBITAL Performed at [facility] [address], [place], [place] [address]    Special Requests   Final    IN PEDIATRIC BOTTLE Blood Culture results may not be optimal due to an inadequate volume of blood received in culture bottles Performed at [facility] [address], [place], [place] [address]    Culture  Setup Time   Final    GRAM POSITIVE COCCI AEROBIC BOTTLE ONLY CRITICAL RESULT CALLED TO, READ BACK BY AND VERIFIED WITH: PHARMD MICHELLE BELL 12/28/21'@2'$ :31 BY TW    Culture (A)  Final    STAPHYLOCOCCUS HOMINIS THE SIGNIFICANCE OF ISOLATING THIS ORGANISM FROM A SINGLE SET OF BLOOD CULTURES WHEN MULTIPLE SETS ARE DRAWN IS UNCERTAIN. PLEASE NOTIFY THE MICROBIOLOGY DEPARTMENT WITHIN ONE WEEK IF SPECIATION AND SENSITIVITIES ARE REQUIRED. Performed at [facility], [place] [address], [place],  [address]    Report Status 12/29/2021 FINAL  Final  Blood Culture ID Panel (Reflexed)     Status: Abnormal   Collection Time: 12/27/21  2:53 AM  Result Value Ref Range Status   Enterococcus faecalis NOT DETECTED NOT DETECTED Final   Enterococcus Faecium NOT DETECTED NOT DETECTED Final   Listeria monocytogenes NOT DETECTED NOT DETECTED Final   Staphylococcus species DETECTED (A) NOT DETECTED Final    Comment: CRITICAL RESULT CALLED TO, READ BACK BY AND VERIFIED WITH: PHARMD MICHELLE BELL 12/28/21'@2'$ :31 BY TW    Staphylococcus aureus (BCID) NOT DETECTED NOT DETECTED Final   Staphylococcus epidermidis NOT DETECTED NOT DETECTED Final    Staphylococcus lugdunensis NOT DETECTED NOT DETECTED Final   Streptococcus species NOT DETECTED NOT DETECTED Final   Streptococcus agalactiae NOT DETECTED NOT DETECTED Final   Streptococcus pneumoniae NOT DETECTED NOT DETECTED Final   Streptococcus pyogenes NOT DETECTED NOT DETECTED Final  A.calcoaceticus-baumannii NOT DETECTED NOT DETECTED Final   Bacteroides fragilis NOT DETECTED NOT DETECTED Final   Enterobacterales NOT DETECTED NOT DETECTED Final   Enterobacter cloacae complex NOT DETECTED NOT DETECTED Final   Escherichia coli NOT DETECTED NOT DETECTED Final   Klebsiella aerogenes NOT DETECTED NOT DETECTED Final   Klebsiella oxytoca NOT DETECTED NOT DETECTED Final   Klebsiella pneumoniae NOT DETECTED NOT DETECTED Final   Proteus species NOT DETECTED NOT DETECTED Final   Salmonella species NOT DETECTED NOT DETECTED Final   Serratia marcescens NOT DETECTED NOT DETECTED Final   Haemophilus influenzae NOT DETECTED NOT DETECTED Final   Neisseria meningitidis NOT DETECTED NOT DETECTED Final   Pseudomonas aeruginosa NOT DETECTED NOT DETECTED Final   Stenotrophomonas maltophilia NOT DETECTED NOT DETECTED Final   Candida albicans NOT DETECTED NOT DETECTED Final   Candida auris NOT DETECTED NOT DETECTED Final   Candida glabrata NOT DETECTED NOT DETECTED Final   Candida krusei NOT DETECTED NOT DETECTED Final   Candida parapsilosis NOT DETECTED NOT DETECTED Final   Candida tropicalis NOT DETECTED NOT DETECTED Final   Cryptococcus neoformans/gattii NOT DETECTED NOT DETECTED Final    Comment: Performed at [facility], [place]. [address], [place], [place] [address]  C Difficile Quick Screen w PCR reflex     Status: None   Collection Time: 12/27/21  2:17 PM   Specimen: STOOL  Result Value Ref Range Status   C Diff antigen NEGATIVE NEGATIVE Final   C Diff toxin NEGATIVE NEGATIVE Final   C Diff interpretation No C. difficile detected.  Final    Comment: Performed at [facility], [place] [address] [place], [place] [address]  Culture, blood (Routine X 2) w Reflex to ID Panel     Status: None (Preliminary result)   Collection Time: 12/28/21 10:36 AM   Specimen: BLOOD  Result Value Ref Range Status   Specimen Description   Final    BLOOD LEFT ANTECUBITAL Performed at [facility] [address], [place], [place] [address]    Special Requests   Final    BOTTLES DRAWN AEROBIC ONLY Blood Culture adequate volume Performed at [facility] [address], [place], [place] [address]    Culture   Final    NO GROWTH 2 DAYS Performed at [facility] [address] [place], [place] [address]    Report Status PENDING  Incomplete  Culture, blood (Routine X 2) w Reflex to ID Panel     Status: None (Preliminary result)   Collection Time: 12/28/21 10:36 AM   Specimen: BLOOD LEFT HAND  Result Value Ref Range Status   Specimen Description   Final    BLOOD LEFT HAND Performed at [facility] [address] [place], [place] [address]    Special Requests   Final    IN PEDIATRIC BOTTLE Blood Culture adequate volume Performed at [facility] [address], [place], [place] [address]    Culture   Final    NO GROWTH 2 DAYS Performed at [facility] [address], [place], [place] [address]    Report Status PENDING  Incomplete         Radiology Studies: DG Abd 2 Views  Result Date: 12/31/2021 CLINICAL DATA:  Acute colitis, metastatic breast cancer EXAM: ABDOMEN - 2 VIEW COMPARISON:  CT abdomen 12/26/2021 FINDINGS: Scattered sclerotic osseous metastatic disease. Gas-filled small bowel without dilatation. Relatively gasless sigmoid colon in  the region of prior inflammation. No obvious signs of pneumatosis. There is some gas below the diaphragm spanning the midline, somewhat high in position on the erect view, but with a lobular configuration which seems to favor intraluminal  gas over extraluminal gas. Consider a left lateral decubitus view of the abdomen for confirmation. IMPRESSION: 1. Gas in the uppermost abdomen in the midline seems to have a lobular configuration suggesting that it is intraluminal. Consider a left side down lateral decubitus projection to exclude the less likely possibility of free intraperitoneal gas. 2. Paucity of gas in the previously inflamed sigmoid region, which may be a manifestation of continued inflammation but which is technically nonspecific. 3. No overtly dilated small bowel is currently identified. 4. Scattered sclerotic osseous metastatic disease. Electronically Signed   By: Van Clines M.D.   On: 12/31/2021 08:42        Scheduled Meds:  Chlorhexidine Gluconate Cloth  6 each Topical Daily   feeding supplement  1 Container Oral TID BM   multivitamin with minerals  1 tablet Oral Daily   Continuous Infusions:  ceFEPime (MAXIPIME) IV 2 g (12/31/21 0426)   famotidine (PEPCID) IV 20 mg (12/31/21 0904)   metronidazole 500 mg (12/31/21 0954)          Aline August, MD [facility] 12/31/2021, 10:25 AM

## 2021-12-31 NOTE — TOC Progression Note (Signed)
Transition of Care [facility]) - Progression Note    Patient Details  Name: Cheryl Singh MRN: [id number] Date of Birth: 1951/01/24  Transition of [facility]) CM/SW Contact  Joaquin Courts, RN Phone Number: 12/31/2021, 10:32 AM  Clinical Narrative:    CM screened chart, notes patient from home with plans to return home at discharge, noted has pcp and remains on RA.  Noted not follow-up PT recommendations, patient has no TOC needs identified at this time.  TOC will sign off, please consult if new needs arise.   Expected Discharge Plan: Home/Self Care Barriers to Discharge: Continued Medical Work up  Expected Discharge Plan and Services Expected Discharge Plan: Home/Self Care   Discharge Planning Services: CM Consult   Living arrangements for the past 2 months: Single Family Home                                       Social Determinants of Health (SDOH) Interventions    Readmission Risk Interventions    12/31/2021   10:31 AM 12/27/2021   12:15 PM  Readmission Risk Prevention Plan  Transportation Screening Complete Complete  PCP or Specialist Appt within 3-5 Days Complete Complete  HRI or Home Care Consult Complete Complete  Social Work Consult for [place] Planning/Counseling Complete Complete  Palliative Care Screening Not Applicable Not Applicable  Medication Review [employer]) Complete Complete

## 2022-01-01 DIAGNOSIS — K529 Noninfective gastroenteritis and colitis, unspecified: Secondary | ICD-10-CM | POA: Diagnosis not present

## 2022-01-01 DIAGNOSIS — D61818 Other pancytopenia: Secondary | ICD-10-CM | POA: Diagnosis not present

## 2022-01-01 DIAGNOSIS — E876 Hypokalemia: Secondary | ICD-10-CM

## 2022-01-01 DIAGNOSIS — C679 Malignant neoplasm of bladder, unspecified: Secondary | ICD-10-CM | POA: Diagnosis not present

## 2022-01-01 LAB — BASIC METABOLIC PANEL
Anion gap: 7 (ref 5–15)
BUN: 6 mg/dL — ABNORMAL LOW (ref 8–23)
CO2: 27 mmol/L (ref 22–32)
Calcium: 7.8 mg/dL — ABNORMAL LOW (ref 8.9–10.3)
Chloride: 103 mmol/L (ref 98–111)
Creatinine, Ser: 0.55 mg/dL (ref 0.44–1.00)
GFR, Estimated: >60 mL/min (ref >60)
Glucose, Bld: 86 mg/dL (ref 70–99)
Potassium: 2.9 mmol/L — ABNORMAL LOW (ref 3.5–5.1)
Sodium: 137 mmol/L (ref 135–145)

## 2022-01-01 LAB — CBC WITH DIFFERENTIAL/PLATELET
Abs Immature Granulocytes: 4 10*3/uL — ABNORMAL HIGH (ref 0.00–0.07)
Basophils Absolute: 0 10*3/uL (ref 0.0–0.1)
Basophils Relative: 0 %
Eosinophils Absolute: 0 10*3/uL (ref 0.0–0.5)
Eosinophils Relative: 0 %
HCT: 28.3 % — ABNORMAL LOW (ref 36.0–46.0)
Hemoglobin: 9.2 g/dL — ABNORMAL LOW (ref 12.0–15.0)
Lymphocytes Relative: 5 %
Lymphs Abs: 0.6 10*3/uL — ABNORMAL LOW (ref 0.7–4.0)
MCH: 30.9 pg (ref 26.0–34.0)
MCHC: 32.5 g/dL (ref 30.0–36.0)
MCV: 95 fL (ref 80.0–100.0)
Metamyelocytes Relative: 5 %
Monocytes Absolute: 1.2 10*3/uL — ABNORMAL HIGH (ref 0.1–1.0)
Monocytes Relative: 9 %
Myelocytes: 21 %
Neutro Abs: 7 10*3/uL (ref 1.7–7.7)
Neutrophils Relative %: 55 %
Platelets: 75 10*3/uL — ABNORMAL LOW (ref 150–400)
Promyelocytes Relative: 5 %
RBC: 2.98 MIL/uL — ABNORMAL LOW (ref 3.87–5.11)
RDW: 17.8 % — ABNORMAL HIGH (ref 11.5–15.5)
WBC: 12.8 10*3/uL — ABNORMAL HIGH (ref 4.0–10.5)
nRBC: 0.2 % (ref 0.0–0.2)

## 2022-01-01 LAB — MAGNESIUM: Magnesium: 1.3 mg/dL — ABNORMAL LOW (ref 1.7–2.4)

## 2022-01-01 MED ORDER — MAGNESIUM SULFATE 2 GM/50ML IV SOLN
2.0000 g | Freq: Once | INTRAVENOUS | Status: AC
  Administered 2022-01-01: 2 g via INTRAVENOUS
  Filled 2022-01-01: qty 50

## 2022-01-01 MED ORDER — POTASSIUM CHLORIDE CRYS ER 20 MEQ PO TBCR
40.0000 meq | EXTENDED_RELEASE_TABLET | ORAL | Status: AC
  Administered 2022-01-01 (×2): 40 meq via ORAL
  Filled 2022-01-01 (×3): qty 2

## 2022-01-01 NOTE — Progress Notes (Signed)
PROGRESS NOTE    Cheryl Singh  GEX:[id number] DOB: 08-12-1950 DOA: 12/26/2021 PCP: Glenis Smoker, MD   Brief Narrative:   71 y.o. female with medical history significant of hyperlipidemia, hypertension, anemia, metastatic bladder cancer, recent admission and discharge from 11/12/2021-11/23/2021 for acute colitis treated with IV and subsequently oral antibiotics presented with worsening left-sided abdominal pain.  On presentation, she was found to be pancytopenic.  CT of the abdomen/pelvis showed prominent long-segment circumferential thickening of the rectosigmoid colon compatible with acute colitis.  She was started on IV antibiotics.  Oncology was consulted.  Assessment & Plan:   Recurrent acute colitis -Patient was hospitalized last month for colitis and treated with IV and subsequently oral antibiotics. -Presented again with worsening left-sided abdominal pain: Imaging findings as above. -Currently on cefepime and Flagyl.  Diarrhea improving.  Stool for C. difficile negative. -Currently on regular diet.  Still having intermittent abdominal pain with some nausea and still does not feel ready to go home today.  Staphylococcus bacteremia -1 out of 3 bottles; mostly contaminant.  Repeat blood cultures are negative.  Vancomycin has been discontinued on 12/29/2021.  Pancytopenia History of metastatic bladder cancer currently undergoing chemotherapy -Oncology following.  Patient received 2 units packed red cells on 12/26/2021.  Hemoglobin 9.2 today.  WBC improving to 12.8 today.  Granix discontinued by oncology on 12/29/2021.  Platelets 75 today.  Repeat a.m. labs.  Hyponatremia--mild.  Resolved  hypokalemia -Replace.  Repeat a.m. labs  hypomagnesemia -Replace.  Repeat a.m. labs   hyperlipidemia -Statin on hold  Hypertension -Monitor blood pressure.    DVT prophylaxis: SCDs Code Status: Full Family Communication: None at bedside Disposition Plan: Status is:  Inpatient Remains inpatient appropriate because: Of severity of illness.  Possible discharge tomorrow if continues to improve.    Consultants: Oncology  Procedures: None  Antimicrobials:  Anti-infectives (From admission, onward)    Start     Dose/Rate Route Frequency Ordered Stop   12/29/21 1200  vancomycin (VANCOCIN) IVPB 1000 mg/200 mL premix  Status:  Discontinued        1,000 mg 200 mL/hr over 60 Minutes Intravenous Every 24 hours 12/28/21 1052 12/29/21 1350   12/29/21 0400  ceFEPIme (MAXIPIME) 2 g in sodium chloride 0.9 % 100 mL IVPB        2 g 200 mL/hr over 30 Minutes Intravenous Every 12 hours 12/28/21 2120     12/28/21 1400  ceFEPIme (MAXIPIME) 2 g in sodium chloride 0.9 % 100 mL IVPB  Status:  Discontinued        2 g 200 mL/hr over 30 Minutes Intravenous Every 12 hours 12/28/21 1049 12/28/21 2120   12/28/21 1400  metroNIDAZOLE (FLAGYL) IVPB 500 mg        500 mg 100 mL/hr over 60 Minutes Intravenous 2 times daily 12/28/21 1049     12/28/21 1145  vancomycin (VANCOREADY) IVPB 1250 mg/250 mL        1,250 mg 166.7 mL/hr over 90 Minutes Intravenous  Once 12/28/21 1048 12/28/21 1308   12/26/21 1800  piperacillin-tazobactam (ZOSYN) IVPB 4.5 g  Status:  Discontinued        4.5 g 200 mL/hr over 30 Minutes Intravenous Every 6 hours 12/26/21 1621 12/26/21 1623   12/26/21 1700  piperacillin-tazobactam (ZOSYN) IVPB 3.375 g  Status:  Discontinued        3.375 g 12.5 mL/hr over 240 Minutes Intravenous Every 8 hours 12/26/21 1624 12/28/21 0956   12/26/21 1315  ceFEPIme (MAXIPIME) 2 g in  sodium chloride 0.9 % 100 mL IVPB        2 g 200 mL/hr over 30 Minutes Intravenous NOW 12/26/21 1313 12/26/21 1405   12/26/21 1315  metroNIDAZOLE (FLAGYL) IVPB 500 mg  Status:  Discontinued        500 mg 100 mL/hr over 60 Minutes Intravenous Every 12 hours 12/26/21 1313 12/26/21 1621        Subjective: Patient seen and examined at bedside.  Feels slightly better but still having intermittent  abdominal pain.  Tolerating diet with some nausea.  Does not feel ready to go home today yet.   Objective: Vitals:   12/31/21 0524 12/31/21 1216 12/31/21 2125 01/01/22 0600  BP: (!) 152/82 (!) 120/94 (!) 147/95 (!) 137/95  Pulse: 85 90 92 76  Resp: '18 17 20 20  '$ Temp: 97.9 F (36.6 C) (!) 97.5 F (36.4 C) 98.1 F (36.7 C) 98.1 F (36.7 C)  TempSrc: Oral Oral Oral Oral  SpO2: 99% 99% 99% 100%  Weight:      Height:        Intake/Output Summary (Last 24 hours) at 01/01/2022 1054 Last data filed at 01/01/2022 1021 Gross per 24 hour  Intake 942.58 ml  Output --  Net 942.58 ml    Filed Weights   12/29/21 0500 12/30/21 0500 12/31/21 0500  Weight: 64.3 kg 64.5 kg 64.2 kg    Examination:  General: On room air currently.  No distress.  Looks chronically ill and deconditioned. ENT/neck: No signs of JVD elevation.  No obvious thyromegaly noted.  Respiratory: Decreased breath sounds at bases bilaterally with some scattered crackles  CVS: S1-S2 heard; rate controlled currently  abdominal: Soft, has mild tenderness in the lower quadrant; slightly distended; no organomegaly, normal bowel sounds are heard extremities: Mild lower extremity edema present; no cyanosis  CNS: Awake and alert.  No focal neurologic deficit.  Able to move extremities lymph: No obvious lymphadenopathy palpable  skin: No obvious petechiae/lesions psych: Affect is mostly flat.  No signs of agitation.   Musculoskeletal: No joint swelling/deformity/tenderness     Data Reviewed: I have personally reviewed following labs and imaging studies  CBC: Recent Labs  Lab 12/28/21 0349 12/29/21 0346 12/30/21 0348 12/31/21 0410 01/01/22 0613  WBC 1.6* 8.0 19.3* 19.2* 12.8*  NEUTROABS 0.7* 4.6 11.5* 10.1* 7.0  HGB 9.1* 9.8* 9.1* 8.9* 9.2*  HCT 26.2* 29.3* 27.3* 27.3* 28.3*  MCV 91.3 92.7 94.1 94.8 95.0  PLT 15* 27* 44* 57* 75*    Basic Metabolic Panel: Recent Labs  Lab 12/28/21 0349 12/29/21 0346 12/30/21 0348  12/31/21 0410 01/01/22 0613  NA 136 137 134* 134* 137  K 2.3* 3.3* 3.2* 4.0 2.9*  CL 105 108 105 103 103  CO2 22 21* '22 25 27  '$ GLUCOSE 91 72 81 84 86  BUN 10 7* 6* <5* 6*  CREATININE 0.56 0.58 0.60 0.51 0.55  CALCIUM 6.8* 7.6* 8.0* 7.9* 7.8*  MG 1.1* 1.7 1.5* 1.7 1.3*    GFR: Estimated Creatinine Clearance: 57.7 mL/min (by C-G formula based on SCr of 0.55 mg/dL). Liver Function Tests: Recent Labs  Lab 12/26/21 1208 12/26/21 1600 12/27/21 0253 12/28/21 0349  AST 9* 8* 9* 10*  ALT '8 9 8 9  '$ ALKPHOS 96 99 84 84  BILITOT 0.5 0.8 1.0 0.8  PROT 5.2* 5.5* 4.9* 4.8*  ALBUMIN 2.0* 2.2* 2.0* 2.0*    No results for input(s): "LIPASE", "AMYLASE" in the last 168 hours. No results for input(s): "AMMONIA" in the  last 168 hours. Coagulation Profile: No results for input(s): "INR", "PROTIME" in the last 168 hours. Cardiac Enzymes: No results for input(s): "CKTOTAL", "CKMB", "CKMBINDEX", "TROPONINI" in the last 168 hours. BNP (last 3 results) No results for input(s): "PROBNP" in the last 8760 hours. HbA1C: No results for input(s): "HGBA1C" in the last 72 hours. CBG: No results for input(s): "GLUCAP" in the last 168 hours. Lipid Profile: No results for input(s): "CHOL", "HDL", "LDLCALC", "TRIG", "CHOLHDL", "LDLDIRECT" in the last 72 hours. Thyroid Function Tests: No results for input(s): "TSH", "T4TOTAL", "FREET4", "T3FREE", "THYROIDAB" in the last 72 hours. Anemia Panel: No results for input(s): "VITAMINB12", "FOLATE", "FERRITIN", "TIBC", "IRON", "RETICCTPCT" in the last 72 hours. Sepsis Labs: Recent Labs  Lab 12/26/21 1208 12/26/21 1405  LATICACIDVEN 1.4 1.4     Recent Results (from the past 240 hour(s))  Blood culture (routine x 2)     Status: None   Collection Time: 12/26/21 12:08 PM   Specimen: BLOOD  Result Value Ref Range Status   Specimen Description   Final    BLOOD RIGHT ANTECUBITAL Performed at [facility] [address], [place],  [place] [address]    Special Requests   Final    BOTTLES DRAWN AEROBIC AND ANAEROBIC Blood Culture adequate volume Performed at [facility] [address], [place], [place] [address]    Culture   Final    NO GROWTH 5 DAYS Performed at [facility], [place] [address], [place], [place] [address]    Report Status 12/31/2021 FINAL  Final  Blood culture (routine x 2)     Status: Abnormal   Collection Time: 12/27/21  2:53 AM   Specimen: BLOOD  Result Value Ref Range Status   Specimen Description   Final    BLOOD LEFT ANTECUBITAL Performed at [facility] [address], [place], [place] [address]    Special Requests   Final    IN PEDIATRIC BOTTLE Blood Culture results may not be optimal due to an inadequate volume of blood received in culture bottles Performed at [facility] [address] [place], [place] [address]    Culture  Setup Time   Final    GRAM POSITIVE COCCI AEROBIC BOTTLE ONLY CRITICAL RESULT CALLED TO, READ BACK BY AND VERIFIED WITH: PHARMD MICHELLE BELL 12/28/21'@2'$ :31 BY TW    Culture (A)  Final    STAPHYLOCOCCUS HOMINIS THE SIGNIFICANCE OF ISOLATING THIS ORGANISM FROM A SINGLE SET OF BLOOD CULTURES WHEN MULTIPLE SETS ARE DRAWN IS UNCERTAIN. PLEASE NOTIFY THE MICROBIOLOGY DEPARTMENT WITHIN ONE WEEK IF SPECIATION AND SENSITIVITIES ARE REQUIRED. Performed at [facility], [place] [address], [place], [place] [address]    Report Status 12/29/2021 FINAL  Final  Blood Culture ID Panel (Reflexed)     Status: Abnormal   Collection Time: 12/27/21  2:53 AM  Result Value Ref Range Status   Enterococcus faecalis NOT DETECTED NOT DETECTED Final   Enterococcus Faecium NOT DETECTED NOT DETECTED Final   Listeria monocytogenes NOT DETECTED NOT DETECTED Final   Staphylococcus species DETECTED (A) NOT DETECTED Final    Comment: CRITICAL RESULT CALLED TO, READ BACK BY AND VERIFIED WITH: PHARMD MICHELLE BELL 12/28/21'@2'$ :31 BY TW     Staphylococcus aureus (BCID) NOT DETECTED NOT DETECTED Final   Staphylococcus epidermidis NOT DETECTED NOT DETECTED Final   Staphylococcus lugdunensis NOT DETECTED NOT DETECTED Final   Streptococcus species NOT DETECTED NOT DETECTED Final   Streptococcus agalactiae NOT DETECTED NOT  DETECTED Final   Streptococcus pneumoniae NOT DETECTED NOT DETECTED Final   Streptococcus pyogenes NOT DETECTED NOT DETECTED Final   A.calcoaceticus-baumannii NOT DETECTED NOT DETECTED Final   Bacteroides fragilis NOT DETECTED NOT DETECTED Final   Enterobacterales NOT DETECTED NOT DETECTED Final   Enterobacter cloacae complex NOT DETECTED NOT DETECTED Final   Escherichia coli NOT DETECTED NOT DETECTED Final   Klebsiella aerogenes NOT DETECTED NOT DETECTED Final   Klebsiella oxytoca NOT DETECTED NOT DETECTED Final   Klebsiella pneumoniae NOT DETECTED NOT DETECTED Final   Proteus species NOT DETECTED NOT DETECTED Final   Salmonella species NOT DETECTED NOT DETECTED Final   Serratia marcescens NOT DETECTED NOT DETECTED Final   Haemophilus influenzae NOT DETECTED NOT DETECTED Final   Neisseria meningitidis NOT DETECTED NOT DETECTED Final   Pseudomonas aeruginosa NOT DETECTED NOT DETECTED Final   Stenotrophomonas maltophilia NOT DETECTED NOT DETECTED Final   Candida albicans NOT DETECTED NOT DETECTED Final   Candida auris NOT DETECTED NOT DETECTED Final   Candida glabrata NOT DETECTED NOT DETECTED Final   Candida krusei NOT DETECTED NOT DETECTED Final   Candida parapsilosis NOT DETECTED NOT DETECTED Final   Candida tropicalis NOT DETECTED NOT DETECTED Final   Cryptococcus neoformans/gattii NOT DETECTED NOT DETECTED Final    Comment: Performed at [facility], [address], [place], [place] [address]  C Difficile Quick Screen w PCR reflex     Status: None   Collection Time: 12/27/21  2:17 PM   Specimen: STOOL  Result Value Ref Range Status   C Diff antigen NEGATIVE NEGATIVE Final   C Diff toxin  NEGATIVE NEGATIVE Final   C Diff interpretation No C. difficile detected.  Final    Comment: Performed at [facility], [place] [address], [place], [place] [address]  Culture, blood (Routine X 2) w Reflex to ID Panel     Status: None (Preliminary result)   Collection Time: 12/28/21 10:36 AM   Specimen: BLOOD  Result Value Ref Range Status   Specimen Description   Final    BLOOD LEFT ANTECUBITAL Performed at [facility] [address], [place], [place] [address]    Special Requests   Final    BOTTLES DRAWN AEROBIC ONLY Blood Culture adequate volume Performed at [facility] [address] [place], [place] [address]    Culture   Final    NO GROWTH 4 DAYS Performed at [facility], [place] [address], [place], [place] [address]    Report Status PENDING  Incomplete  Culture, blood (Routine X 2) w Reflex to ID Panel     Status: None (Preliminary result)   Collection Time: 12/28/21 10:36 AM   Specimen: BLOOD LEFT HAND  Result Value Ref Range Status   Specimen Description   Final    BLOOD LEFT HAND Performed at [facility] [address] [place], [place] [address]    Special Requests   Final    IN PEDIATRIC BOTTLE Blood Culture adequate volume Performed at [facility] [address] [place] [address] [place], [place] [address]    Culture   Final    NO GROWTH 4 DAYS Performed at [facility], [place] [address], [place],  [address]    Report Status PENDING  Incomplete         Radiology Studies: DG Abd 2 Views  Result Date: 12/31/2021 CLINICAL DATA:  Acute colitis, metastatic breast cancer EXAM: ABDOMEN - 2  VIEW COMPARISON:  CT abdomen 12/26/2021 FINDINGS: Scattered sclerotic osseous metastatic disease. Gas-filled small bowel without dilatation. Relatively gasless sigmoid colon in the region of prior inflammation. No obvious signs of pneumatosis. There is some gas below the diaphragm  spanning the midline, somewhat high in position on the erect view, but with a lobular configuration which seems to favor intraluminal gas over extraluminal gas. Consider a left lateral decubitus view of the abdomen for confirmation. IMPRESSION: 1. Gas in the uppermost abdomen in the midline seems to have a lobular configuration suggesting that it is intraluminal. Consider a left side down lateral decubitus projection to exclude the less likely possibility of free intraperitoneal gas. 2. Paucity of gas in the previously inflamed sigmoid region, which may be a manifestation of continued inflammation but which is technically nonspecific. 3. No overtly dilated small bowel is currently identified. 4. Scattered sclerotic osseous metastatic disease. Electronically Signed   By: Van Clines M.D.   On: 12/31/2021 08:42        Scheduled Meds:  Chlorhexidine Gluconate Cloth  6 each Topical Daily   feeding supplement  1 Container Oral TID BM   multivitamin with minerals  1 tablet Oral Daily   potassium chloride  40 mEq Oral Q4H   Continuous Infusions:  ceFEPime (MAXIPIME) IV 2 g (01/01/22 0324)   famotidine (PEPCID) IV 20 mg (01/01/22 0915)   metronidazole 500 mg (01/01/22 1021)          Aline August, MD [facility] 01/01/2022, 10:54 AM

## 2022-01-01 NOTE — Plan of Care (Signed)
  Problem: Clinical Measurements: Goal: Diagnostic test results will improve Outcome: Progressing Goal: Respiratory complications will improve Outcome: Progressing   

## 2022-01-02 ENCOUNTER — Inpatient Hospital Stay: Payer: Medicare PPO

## 2022-01-02 ENCOUNTER — Other Ambulatory Visit: Payer: Self-pay | Admitting: Hematology & Oncology

## 2022-01-02 ENCOUNTER — Inpatient Hospital Stay: Payer: Medicare PPO | Admitting: Hematology & Oncology

## 2022-01-02 DIAGNOSIS — E878 Other disorders of electrolyte and fluid balance, not elsewhere classified: Secondary | ICD-10-CM

## 2022-01-02 DIAGNOSIS — D61818 Other pancytopenia: Secondary | ICD-10-CM | POA: Diagnosis not present

## 2022-01-02 DIAGNOSIS — R109 Unspecified abdominal pain: Secondary | ICD-10-CM | POA: Diagnosis not present

## 2022-01-02 DIAGNOSIS — K5190 Ulcerative colitis, unspecified, without complications: Secondary | ICD-10-CM | POA: Diagnosis not present

## 2022-01-02 LAB — CBC WITH DIFFERENTIAL/PLATELET
Abs Immature Granulocytes: 2.47 10*3/uL — ABNORMAL HIGH (ref 0.00–0.07)
Basophils Absolute: 0 10*3/uL (ref 0.0–0.1)
Basophils Relative: 0 %
Eosinophils Absolute: 0.1 10*3/uL (ref 0.0–0.5)
Eosinophils Relative: 0 %
HCT: 27.4 % — ABNORMAL LOW (ref 36.0–46.0)
Hemoglobin: 8.9 g/dL — ABNORMAL LOW (ref 12.0–15.0)
Immature Granulocytes: 20 %
Lymphocytes Relative: 6 %
Lymphs Abs: 0.7 10*3/uL (ref 0.7–4.0)
MCH: 30.8 pg (ref 26.0–34.0)
MCHC: 32.5 g/dL (ref 30.0–36.0)
MCV: 94.8 fL (ref 80.0–100.0)
Monocytes Absolute: 2.9 10*3/uL — ABNORMAL HIGH (ref 0.1–1.0)
Monocytes Relative: 24 %
Neutro Abs: 5.9 10*3/uL (ref 1.7–7.7)
Neutrophils Relative %: 50 %
Platelets: 110 10*3/uL — ABNORMAL LOW (ref 150–400)
RBC: 2.89 MIL/uL — ABNORMAL LOW (ref 3.87–5.11)
RDW: 17.9 % — ABNORMAL HIGH (ref 11.5–15.5)
WBC: 12.1 10*3/uL — ABNORMAL HIGH (ref 4.0–10.5)
nRBC: 0.2 % (ref 0.0–0.2)

## 2022-01-02 LAB — BASIC METABOLIC PANEL
Anion gap: 5 (ref 5–15)
BUN: 7 mg/dL — ABNORMAL LOW (ref 8–23)
CO2: 28 mmol/L (ref 22–32)
Calcium: 7.5 mg/dL — ABNORMAL LOW (ref 8.9–10.3)
Chloride: 103 mmol/L (ref 98–111)
Creatinine, Ser: 0.42 mg/dL — ABNORMAL LOW (ref 0.44–1.00)
GFR, Estimated: >60 mL/min (ref >60)
Glucose, Bld: 101 mg/dL — ABNORMAL HIGH (ref 70–99)
Potassium: 3.8 mmol/L (ref 3.5–5.1)
Sodium: 136 mmol/L (ref 135–145)

## 2022-01-02 LAB — CULTURE, BLOOD (ROUTINE X 2)
Culture: NO GROWTH
Culture: NO GROWTH
Special Requests: ADEQUATE
Special Requests: ADEQUATE

## 2022-01-02 LAB — MAGNESIUM: Magnesium: 1.5 mg/dL — ABNORMAL LOW (ref 1.7–2.4)

## 2022-01-02 MED ORDER — AMOXICILLIN-POT CLAVULANATE 875-125 MG PO TABS
1.0000 | ORAL_TABLET | Freq: Two times a day (BID) | ORAL | Status: DC
  Administered 2022-01-02 – 2022-01-03 (×2): 1 via ORAL
  Filled 2022-01-02 (×2): qty 1

## 2022-01-02 MED ORDER — MAGNESIUM OXIDE -MG SUPPLEMENT 400 (240 MG) MG PO TABS
400.0000 mg | ORAL_TABLET | Freq: Two times a day (BID) | ORAL | Status: DC
  Administered 2022-01-02 – 2022-01-03 (×2): 400 mg via ORAL
  Filled 2022-01-02 (×2): qty 1

## 2022-01-02 MED ORDER — FAMOTIDINE 20 MG PO TABS
20.0000 mg | ORAL_TABLET | Freq: Two times a day (BID) | ORAL | Status: DC
  Administered 2022-01-02 – 2022-01-03 (×2): 20 mg via ORAL
  Filled 2022-01-02 (×2): qty 1

## 2022-01-02 NOTE — Progress Notes (Signed)
Education provided regarding use of CHG cloths and infection prevention. The patient declined use.

## 2022-01-02 NOTE — Progress Notes (Signed)
Physical Therapy Treatment Patient Details Name: Cheryl Singh MRN: [id number] DOB: 06/30/1951 Today's Date: 01/02/2022   History of Present Illness 71 yo female admitted with recurrent colitis, bacteremia. Hx of met bladder ca    PT Comments    General Comments: AxO x 3 "private person" short answers.  Following all commands. Plans to D/C back home.  Was driving but has a "friend" who can help her "get grocceries/meds". Pt in the bathroom, in the dark when Therapist arrived.  "I just need a few minutes".  General transfer comment: pt was OOB in bathroom unassisted when Therapist arrived.  Pt able to push her IV pole around room as well as to and from bathroom. General Gait Details: pt amb self in room and to and from bathroom push her own IV pole.  Did use a walker in hallway. Pt admits to "pretty much stay at home". lifestyle. Uses a wheel chair when out. Pt plans to return home with help from her "friend".    Recommendations for follow up therapy are one component of a multi-disciplinary discharge planning process, led by the attending physician.  Recommendations may be updated based on patient status, additional functional criteria and insurance authorization.  Follow Up Recommendations  No PT follow up     Assistance Recommended at Discharge Intermittent Supervision/Assistance  Patient can return home with the following Assist for transportation   Equipment Recommendations  None recommended by PT    Recommendations for Other Services       Precautions / Restrictions Precautions Precautions: Fall Restrictions Weight Bearing Restrictions: No     Mobility  Bed Mobility Overal bed mobility: Modified Independent             General bed mobility comments: Pt self able with increased time.    Transfers Overall transfer level: Needs assistance Equipment used: Rolling walker (2 wheels), None Transfers: Sit to/from Stand Sit to Stand: Supervision            General transfer comment: pt was OOB in bathroom unassisted when Therapist arrived.  Pt able to push her IV pole around room as well as to and from bathroom.    Ambulation/Gait Ambulation/Gait assistance: Supervision Gait Distance (Feet): 65 Feet Assistive device: Rolling walker (2 wheels) Gait Pattern/deviations: Step-through pattern Gait velocity: decreased     General Gait Details: pt amb self in room and to and from bathroom push her own IV pole.  Did use a walker in hallway. Pt admits to "pretty much stay at home". lifestyle.   Stairs             Wheelchair Mobility    Modified Rankin (Stroke Patients Only)       Balance                                            Cognition Arousal/Alertness: Awake/alert Behavior During Therapy: WFL for tasks assessed/performed Overall Cognitive Status: Within Functional Limits for tasks assessed                                 General Comments: AxO x 3 "private person" short answers.  Following all commands. Plans to D/C back home.  Was driving but has a "friend" who can help her "get grocceries/meds".        Exercises  General Comments        Pertinent Vitals/Pain Pain Assessment Pain Assessment: Faces Pain Location: abdomen Pain Descriptors / Indicators: Discomfort, Sore Pain Intervention(s): Monitored during session    Home Living                          Prior Function            PT Goals (current goals can now be found in the care plan section) Progress towards PT goals: Progressing toward goals    Frequency    Min 3X/week      PT Plan Current plan remains appropriate    Co-evaluation              AM-PAC PT "6 Clicks" Mobility   Outcome Measure  Help needed turning from your back to your side while in a flat bed without using bedrails?: None Help needed moving from lying on your back to sitting on the side of a flat bed without using  bedrails?: None Help needed moving to and from a bed to a chair (including a wheelchair)?: None Help needed standing up from a chair using your arms (e.g., wheelchair or bedside chair)?: None Help needed to walk in hospital room?: None Help needed climbing 3-5 steps with a railing? : A Little 6 Click Score: 23    End of Session Equipment Utilized During Treatment: Gait belt Activity Tolerance: Patient tolerated treatment well Patient left: in bed;with call bell/phone within reach Nurse Communication: Mobility status PT Visit Diagnosis: Muscle weakness (generalized) (M62.81)     Time: 1139-1150 PT Time Calculation (min) (ACUTE ONLY): 11 min  Charges:  $Gait Training: 8-22 mins                     {Kramer Hanrahan  PTA Acute  [employer] M-F          [phone] Weekend pager [phone]

## 2022-01-02 NOTE — Progress Notes (Signed)
Cheryl Singh says she is feeling better.  She would like to go home.  She is eating.  She is having no vomiting.  She is still having some abdominal discomfort but she feels that she can manage this.  She has had no bleeding.  There is been no diarrhea.  She is out of bed a little bit.  She has had no fever.  She has had no cough or shortness of breath.  Her labs look better.  Potassium is 3.8 now.  Her BUN is 7 creatinine 0.42.  Her calcium is 7.5.  Her white cell count is 12.1.  Hemoglobin 8.9.  Her platelet count is 110,000.  She has had no rashes.  There is been no leg swelling.  She had a 1 positive blood culture which I think was more of a contaminant.  Again I think that she is probably able to go home.  She feels good.  I think she would do better at home.  She was knows when she is ready to go.  Her vital signs are temperature of 98.5.  Pulse 91.  Blood pressure 157/88.  Her abdomen is soft.  Bowel sounds are active.  There is no guarding or rebound tenderness.  She has no fluid wave.  Her lungs are clear.  She has good air movement bilaterally.  Cardiac exam regular rate and rhythm.  Cheryl Singh had another bout of colitis.  I am not sure if this is secondary to her chemotherapy.  However, I just do not think that she really should have any further chemotherapy.  We may have to come up with another option for her with respect to treatment.  I know she is gotten incredible care from all the staff on [address].  Lattie Haw, MD  Romans 8:26

## 2022-01-02 NOTE — Progress Notes (Signed)
PROGRESS NOTE    Cheryl Singh  UVO:[id number] DOB: 1950-10-25 DOA: 12/26/2021 PCP: Glenis Smoker, MD   Brief Narrative:   71 y.o. female with medical history significant of hyperlipidemia, hypertension, anemia, metastatic bladder cancer, recent admission and discharge from 11/12/2021-11/23/2021 for acute colitis treated with IV and subsequently oral antibiotics presented with worsening left-sided abdominal pain.  On presentation, she was found to be pancytopenic.  CT of the abdomen/pelvis showed prominent long-segment circumferential thickening of the rectosigmoid colon compatible with acute colitis.  She was started on IV antibiotics.  Oncology was consulted.  She has stabilized with intermitten abd discomfort and likely can d/c in the next 1-2 days  Assessment & Plan:   Recurrent acute colitis -Patient was hospitalized last month for colitis and treated with IV and subsequently oral antibiotics. -Presented again with worsening left-sided abdominal pain: Imaging findings as above. -Change cefepime Flagyl-->Augmentin. diarrhea improving.  Stool for C. difficile negative. -Currently on regular diet.  Continues to have intermittent discomfort with moving around  Staphylococcus bacteremia -1 out of 3 bottles; mostly contaminant.  Repeat blood cultures are negative.  Vancomycin has been discontinued on 12/29/2021.  Pancytopenia History of metastatic bladder cancer currently undergoing chemotherapy -Oncology following.  Patient received 2 units packed red cells on 12/26/2021.  Hemoglobin stable around 8-9 range, white count down from 19 range to 12 Granix discontinued by oncology on 12/29/2021.  Platelets up to 110  Hyponatremia--mild.  Resolved  hypokalemia -Replace Resolved.  hypomagnesemia -Replacing  p.o. K. Dur  hyperlipidemia -Statin on hold  Hypertension -not on meds urrently--monitor trends    DVT prophylaxis: SCDs Code Status: Full Family Communication: None at  bedside Disposition Plan: Status is: Inpatient Remains inpatient appropriate because: Of severity of illness.    Possible discharge tomorrow if continues to improve.   Consultants: Oncology  Procedures: None  Antimicrobials:  Anti-infectives (From admission, onward)    Start     Dose/Rate Route Frequency Ordered Stop   12/29/21 1200  vancomycin (VANCOCIN) IVPB 1000 mg/200 mL premix  Status:  Discontinued        1,000 mg 200 mL/hr over 60 Minutes Intravenous Every 24 hours 12/28/21 1052 12/29/21 1350   12/29/21 0400  ceFEPIme (MAXIPIME) 2 g in sodium chloride 0.9 % 100 mL IVPB        2 g 200 mL/hr over 30 Minutes Intravenous Every 12 hours 12/28/21 2120     12/28/21 1400  ceFEPIme (MAXIPIME) 2 g in sodium chloride 0.9 % 100 mL IVPB  Status:  Discontinued        2 g 200 mL/hr over 30 Minutes Intravenous Every 12 hours 12/28/21 1049 12/28/21 2120   12/28/21 1400  metroNIDAZOLE (FLAGYL) IVPB 500 mg        500 mg 100 mL/hr over 60 Minutes Intravenous 2 times daily 12/28/21 1049     12/28/21 1145  vancomycin (VANCOREADY) IVPB 1250 mg/250 mL        1,250 mg 166.7 mL/hr over 90 Minutes Intravenous  Once 12/28/21 1048 12/28/21 1308   12/26/21 1800  piperacillin-tazobactam (ZOSYN) IVPB 4.5 g  Status:  Discontinued        4.5 g 200 mL/hr over 30 Minutes Intravenous Every 6 hours 12/26/21 1621 12/26/21 1623   12/26/21 1700  piperacillin-tazobactam (ZOSYN) IVPB 3.375 g  Status:  Discontinued        3.375 g 12.5 mL/hr over 240 Minutes Intravenous Every 8 hours 12/26/21 1624 12/28/21 0956   12/26/21 1315  ceFEPIme (MAXIPIME)  2 g in sodium chloride 0.9 % 100 mL IVPB        2 g 200 mL/hr over 30 Minutes Intravenous NOW 12/26/21 1313 12/26/21 1405   12/26/21 1315  metroNIDAZOLE (FLAGYL) IVPB 500 mg  Status:  Discontinued        500 mg 100 mL/hr over 60 Minutes Intravenous Every 12 hours 12/26/21 1313 12/26/21 1621        Subjective: Some abdominal discomfort no fever no chills  nausea Not having any diarrhea today  Objective: Vitals:   01/01/22 2023 01/02/22 0349 01/02/22 0350 01/02/22 0939  BP: (!) 148/80 (!) 157/88  (!) 147/86  Pulse: 85 91  67  Resp: '20 20  16  '$ Temp: 98.1 F (36.7 C) 98.5 F (36.9 C)  98 F (36.7 C)  TempSrc: Oral Oral  Oral  SpO2: 98% 99%  98%  Weight:   63.1 kg   Height:        Intake/Output Summary (Last 24 hours) at 01/02/2022 1552 Last data filed at 01/02/2022 1526 Gross per 24 hour  Intake 1169.97 ml  Output --  Net 1169.97 ml    Filed Weights   12/30/21 0500 12/31/21 0500 01/02/22 0350  Weight: 64.5 kg 64.2 kg 63.1 kg    Examination:  Awake coherent in nad Cta b no wheeze no rales rhonchi wheeze Abd soft nt nd no rebound Slight distension to abdomen No le edema Psych--slight anxious  Data Reviewed: I have personally reviewed following labs and imaging studies  CBC: Recent Labs  Lab 12/29/21 0346 12/30/21 0348 12/31/21 0410 01/01/22 0613 01/02/22 0338  WBC 8.0 19.3* 19.2* 12.8* 12.1*  NEUTROABS 4.6 11.5* 10.1* 7.0 5.9  HGB 9.8* 9.1* 8.9* 9.2* 8.9*  HCT 29.3* 27.3* 27.3* 28.3* 27.4*  MCV 92.7 94.1 94.8 95.0 94.8  PLT 27* 44* 57* 75* 110*    Basic Metabolic Panel: Recent Labs  Lab 12/29/21 0346 12/30/21 0348 12/31/21 0410 01/01/22 0613 01/02/22 0338  NA 137 134* 134* 137 136  K 3.3* 3.2* 4.0 2.9* 3.8  CL 108 105 103 103 103  CO2 21* '22 25 27 28  '$ GLUCOSE 72 81 84 86 101*  BUN 7* 6* <5* 6* 7*  CREATININE 0.58 0.60 0.51 0.55 0.42*  CALCIUM 7.6* 8.0* 7.9* 7.8* 7.5*  MG 1.7 1.5* 1.7 1.3* 1.5*    GFR: Estimated Creatinine Clearance: 57.7 mL/min (A) (by C-G formula based on SCr of 0.42 mg/dL (L)). Liver Function Tests: Recent Labs  Lab 12/26/21 1600 12/27/21 0253 12/28/21 0349  AST 8* 9* 10*  ALT '9 8 9  '$ ALKPHOS 99 84 84  BILITOT 0.8 1.0 0.8  PROT 5.5* 4.9* 4.8*  ALBUMIN 2.2* 2.0* 2.0*    No results for input(s): "LIPASE", "AMYLASE" in the last 168 hours. No results for input(s):  "AMMONIA" in the last 168 hours. Coagulation Profile: No results for input(s): "INR", "PROTIME" in the last 168 hours. Cardiac Enzymes: No results for input(s): "CKTOTAL", "CKMB", "CKMBINDEX", "TROPONINI" in the last 168 hours. BNP (last 3 results) No results for input(s): "PROBNP" in the last 8760 hours. HbA1C: No results for input(s): "HGBA1C" in the last 72 hours. CBG: No results for input(s): "GLUCAP" in the last 168 hours. Lipid Profile: No results for input(s): "CHOL", "HDL", "LDLCALC", "TRIG", "CHOLHDL", "LDLDIRECT" in the last 72 hours. Thyroid Function Tests: No results for input(s): "TSH", "T4TOTAL", "FREET4", "T3FREE", "THYROIDAB" in the last 72 hours. Anemia Panel: No results for input(s): "VITAMINB12", "FOLATE", "FERRITIN", "TIBC", "IRON", "RETICCTPCT" in the  last 72 hours. Sepsis Labs: No results for input(s): "PROCALCITON", "LATICACIDVEN" in the last 168 hours.   Recent Results (from the past 240 hour(s))  Blood culture (routine x 2)     Status: None   Collection Time: 12/26/21 12:08 PM   Specimen: BLOOD  Result Value Ref Range Status   Specimen Description   Final    BLOOD RIGHT ANTECUBITAL Performed at [facility] [address] [place], [place] [address]    Special Requests   Final    BOTTLES DRAWN AEROBIC AND ANAEROBIC Blood Culture adequate volume Performed at [facility] [address], [place], [place] [address]    Culture   Final    NO GROWTH 5 DAYS Performed at [facility], [place] [address] [place], [place] [address]    Report Status 12/31/2021 FINAL  Final  Blood culture (routine x 2)     Status: Abnormal   Collection Time: 12/27/21  2:53 AM   Specimen: BLOOD  Result Value Ref Range Status   Specimen Description   Final    BLOOD LEFT ANTECUBITAL Performed at [facility] [address], [place], [place] [address]    Special Requests   Final    IN PEDIATRIC BOTTLE Blood Culture  results may not be optimal due to an inadequate volume of blood received in culture bottles Performed at [facility] [address], [place],  [address]    Culture  Setup Time   Final    GRAM POSITIVE COCCI AEROBIC BOTTLE ONLY CRITICAL RESULT CALLED TO, READ BACK BY AND VERIFIED WITH: PHARMD MICHELLE BELL 12/28/21'@2'$ :31 BY TW    Culture (A)  Final    STAPHYLOCOCCUS HOMINIS THE SIGNIFICANCE OF ISOLATING THIS ORGANISM FROM A SINGLE SET OF BLOOD CULTURES WHEN MULTIPLE SETS ARE DRAWN IS UNCERTAIN. PLEASE NOTIFY THE MICROBIOLOGY DEPARTMENT WITHIN ONE WEEK IF SPECIATION AND SENSITIVITIES ARE REQUIRED. Performed at [facility], [place] [address], [place],  [address]    Report Status 12/29/2021 FINAL  Final  Blood Culture ID Panel (Reflexed)     Status: Abnormal   Collection Time: 12/27/21  2:53 AM  Result Value Ref Range Status   Enterococcus faecalis NOT DETECTED NOT DETECTED Final   Enterococcus Faecium NOT DETECTED NOT DETECTED Final   Listeria monocytogenes NOT DETECTED NOT DETECTED Final   Staphylococcus species DETECTED (A) NOT DETECTED Final    Comment: CRITICAL RESULT CALLED TO, READ BACK BY AND VERIFIED WITH: PHARMD MICHELLE BELL 12/28/21'@2'$ :31 BY TW    Staphylococcus aureus (BCID) NOT DETECTED NOT DETECTED Final   Staphylococcus epidermidis NOT DETECTED NOT DETECTED Final   Staphylococcus lugdunensis NOT DETECTED NOT DETECTED Final   Streptococcus species NOT DETECTED NOT DETECTED Final   Streptococcus agalactiae NOT DETECTED NOT DETECTED Final   Streptococcus pneumoniae NOT DETECTED NOT DETECTED Final   Streptococcus pyogenes NOT DETECTED NOT DETECTED Final   A.calcoaceticus-baumannii NOT DETECTED NOT DETECTED Final   Bacteroides fragilis NOT DETECTED NOT DETECTED Final   Enterobacterales NOT DETECTED NOT DETECTED Final   Enterobacter cloacae complex NOT DETECTED NOT DETECTED Final   Escherichia coli NOT DETECTED NOT DETECTED Final    Klebsiella aerogenes NOT DETECTED NOT DETECTED Final   Klebsiella oxytoca NOT DETECTED NOT DETECTED Final   Klebsiella pneumoniae NOT DETECTED NOT DETECTED Final   Proteus species NOT DETECTED NOT DETECTED Final   Salmonella species NOT DETECTED NOT DETECTED Final   Serratia marcescens NOT DETECTED NOT DETECTED Final  Haemophilus influenzae NOT DETECTED NOT DETECTED Final   Neisseria meningitidis NOT DETECTED NOT DETECTED Final   Pseudomonas aeruginosa NOT DETECTED NOT DETECTED Final   Stenotrophomonas maltophilia NOT DETECTED NOT DETECTED Final   Candida albicans NOT DETECTED NOT DETECTED Final   Candida auris NOT DETECTED NOT DETECTED Final   Candida glabrata NOT DETECTED NOT DETECTED Final   Candida krusei NOT DETECTED NOT DETECTED Final   Candida parapsilosis NOT DETECTED NOT DETECTED Final   Candida tropicalis NOT DETECTED NOT DETECTED Final   Cryptococcus neoformans/gattii NOT DETECTED NOT DETECTED Final    Comment: Performed at [facility], [place] [address], [place], [place] [address]  C Difficile Quick Screen w PCR reflex     Status: None   Collection Time: 12/27/21  2:17 PM   Specimen: STOOL  Result Value Ref Range Status   C Diff antigen NEGATIVE NEGATIVE Final   C Diff toxin NEGATIVE NEGATIVE Final   C Diff interpretation No C. difficile detected.  Final    Comment: Performed at [facility], [place] [address], [place], [place] [address]  Culture, blood (Routine X 2) w Reflex to ID Panel     Status: None   Collection Time: 12/28/21 10:36 AM   Specimen: BLOOD  Result Value Ref Range Status   Specimen Description   Final    BLOOD LEFT ANTECUBITAL Performed at [facility] [address] [place], [place] [address]    Special Requests   Final    BOTTLES DRAWN AEROBIC ONLY Blood Culture adequate volume Performed at [facility] [address] [place], [place] [address]    Culture   Final    NO GROWTH 5  DAYS Performed at [facility], [place] [address], [place], [place] [address]    Report Status 01/02/2022 FINAL  Final  Culture, blood (Routine X 2) w Reflex to ID Panel     Status: None   Collection Time: 12/28/21 10:36 AM   Specimen: BLOOD LEFT HAND  Result Value Ref Range Status   Specimen Description   Final    BLOOD LEFT HAND Performed at [facility] [address] [place], [place] [address]    Special Requests   Final    IN PEDIATRIC BOTTLE Blood Culture adequate volume Performed at [facility] [address], [place], [place] [address]    Culture   Final    NO GROWTH 5 DAYS Performed at [facility], [place] [address], [place], [place] [address]    Report Status 01/02/2022 FINAL  Final         Radiology Studies: No results found.      Scheduled Meds:  Chlorhexidine Gluconate Cloth  6 each Topical Daily   famotidine  20 mg Oral BID   feeding supplement  1 Container Oral TID BM   multivitamin with minerals  1 tablet Oral Daily   Continuous Infusions:  ceFEPime (MAXIPIME) IV 2 g (01/02/22 0407)   metronidazole 100 mL/hr at 01/02/22 1526          Nita Sells, MD [facility] 01/02/2022, 3:52 PM

## 2022-01-03 ENCOUNTER — Other Ambulatory Visit: Payer: Self-pay | Admitting: *Deleted

## 2022-01-03 ENCOUNTER — Encounter: Payer: Self-pay | Admitting: Hematology & Oncology

## 2022-01-03 ENCOUNTER — Encounter: Payer: Self-pay | Admitting: *Deleted

## 2022-01-03 DIAGNOSIS — D61818 Other pancytopenia: Secondary | ICD-10-CM | POA: Diagnosis not present

## 2022-01-03 LAB — MAGNESIUM: Magnesium: 1.2 mg/dL — ABNORMAL LOW (ref 1.7–2.4)

## 2022-01-03 MED ORDER — OXYCODONE-ACETAMINOPHEN 5-325 MG PO TABS
1.0000 | ORAL_TABLET | ORAL | 0 refills | Status: DC | PRN
Start: 2022-01-03 — End: 2022-01-14

## 2022-01-03 MED ORDER — HEPARIN SOD (PORK) LOCK FLUSH 100 UNIT/ML IV SOLN
500.0000 [IU] | INTRAVENOUS | Status: AC | PRN
  Administered 2022-01-03: 500 [IU]

## 2022-01-03 MED ORDER — AMOXICILLIN-POT CLAVULANATE 875-125 MG PO TABS: 1.0000 | ORAL_TABLET | Freq: Two times a day (BID) | ORAL | 0 refills | Status: AC

## 2022-01-03 MED ORDER — HEPARIN SOD (PORK) LOCK FLUSH 100 UNIT/ML IV SOLN: 500.0000 [IU] | INTRAVENOUS | Status: DC | PRN

## 2022-01-03 NOTE — Progress Notes (Signed)
Patient will be discharged from the hospital today. She has follow up appointment scheduled for 01/08/22.  Oncology Nurse Navigator Documentation     01/03/2022   10:00 AM  Oncology Nurse Navigator Flowsheets  Navigator Follow Up Date: 01/08/2022  Navigator Follow Up Reason: Follow-up Appointment;Chemotherapy  Navigator Location [facility]  Navigator Encounter Type Appt/Treatment Plan Review  Patient Visit Type MedOnc  Treatment Phase Active Tx  Barriers/Navigation Needs Coordination of Care;Education  Interventions None Required  Acuity Level 2-Minimal Needs (1-2 Barriers Identified)  Support Groups/Services Friends and Family  Time Spent with Patient 15

## 2022-01-03 NOTE — Discharge Summary (Signed)
.[email or web address] Discharge Summary  Cheryl Singh ZES:[id number] DOB: 1951-04-01 DOA: 12/26/2021  PCP: Glenis Smoker, MD  Admit date: 12/26/2021 Discharge date: 01/03/2022  Time spent: 37 minutes  Recommendations for Outpatient Follow-up:  Requires CBC and Chem-12 in about 1 week's time Recommend downward titration of pain meds-only on oxycodone and Celebrex on discharge Complete all antibiotics that were called in Augmentin to complete as per [facility] Consider checking iron levels in the outpatient setting-would not chase down magnesium unless potassium is low  Discharge Diagnoses:  MAIN problem for hospitalization   Recurrent colitis  Please see below for itemized issues addressed in HOpsital- refer to other progress notes for clarity if needed  Discharge Condition: Improved  Diet recommendation: Heart healthy  Filed Weights   12/31/21 0500 01/02/22 0350 01/03/22 0308  Weight: 64.2 kg 63.1 kg 62.6 kg    History of present illness:   71 y.o. female with medical history significant of hyperlipidemia, hypertension, anemia, metastatic bladder cancer, recent admission and discharge from 11/12/2021-11/23/2021 for acute colitis treated with IV and subsequently oral antibiotics presented with worsening left-sided abdominal pain.  On presentation, she was found to be pancytopenic.  CT of the abdomen/pelvis showed prominent long-segment circumferential thickening of the rectosigmoid colon compatible with acute colitis.  She was started on IV antibiotics.  Oncology was consulted.   She has stabilized   Hospital Course:  Recurrent acute colitis -Patient was hospitalized last month for colitis and treated with IV and subsequently oral antibiotics. -Presented again with worsening left-sided abdominal pain: Imaging findings as above. -Change cefepime Flagyl-->Augmentin--- duration should be completed within total of 14 days - Diarrhea improving.  Stool for C. difficile  negative. -Currently on regular diet.  Her intermittent discomfort has resolved and I do not think there is any further need for acute hospitalization   Staphylococcus bacteremia -1 out of 3 bottles; mostly contaminant.  Repeat blood cultures are negative.  Vancomycin has been discontinued on 12/29/2021.   Pancytopenia History of metastatic bladder cancer currently undergoing chemotherapy -Oncology following.  Patient received 2 units packed red cells on 12/26/2021.  Hemoglobin stable around 8-9 range, white count down from 19 range to 12 Granix discontinued by oncology on 12/29/2021.  Platelets up to 110 Patient will need iron levels in addition to CBC in the next several weeks   Hyponatremia--mild.  Resolved   hypokalemia -Replace Resolved.   hypomagnesemia -Replacing  p.o. K. Dur Would not chase   hyperlipidemia -Statin on hold   Hypertension -not on meds urrently--monitor trends      Discharge Exam: Vitals:   01/02/22 2027 01/03/22 0308  BP: 140/85 (!) 141/80  Pulse: 98 68  Resp: 20 18  Temp: 97.8 F (36.6 C) 98.4 F (36.9 C)  SpO2: 98% 99%    Subj on day of d/c   Awake coherent no distress Abdominal pain is better she is eating and drinking she has no chest pain  General Exam on discharge  EOMI NCAT no focal deficit CTA B no added sound rales rhonchi wheeze ROM intact no focal deficit Abdomen soft no rebound no guarding S1-S2 no murmur no rub no gallop Neurologically intact no focal deficit Psych euthymic coherent  Discharge Instructions   Discharge Instructions     Diet - low sodium heart healthy   Complete by: As directed    Discharge instructions   Complete by: As directed    Make sure that you finish all of the antibiotics that have been called into your [facility] You can resume your oxycodone for pain and I do not think you need your potassium at this time Please follow-up with your primary physician in about 1 week to get labs to see what  your kidney function looks like and to ensure that things are stable  You have severe nausea vomiting diarrhea or abdominal pain please present yourself to the emergency room   Increase activity slowly   Complete by: As directed       Allergies as of 01/03/2022   No Known Allergies      Medication List     STOP taking these medications    dexamethasone 4 MG tablet Commonly known as: DECADRON   gabapentin 300 MG capsule Commonly known as: NEURONTIN   Klor-Con M20 20 MEQ tablet Generic drug: potassium chloride SA   polyethylene glycol 17 g packet Commonly known as: MIRALAX / GLYCOLAX   Xtampza ER 9 MG C12a Generic drug: oxyCODONE ER       TAKE these medications    amoxicillin-clavulanate 875-125 MG tablet Commonly known as: AUGMENTIN Take 1 tablet by mouth every 12 (twelve) hours for 6 days.   atorvastatin 10 MG tablet Commonly known as: LIPITOR Take 10 mg by mouth every evening.   celecoxib 200 MG capsule Commonly known as: CeleBREX Take 1 capsule (200 mg total) by mouth 2 (two) times daily.   famotidine 40 MG tablet Commonly known as: PEPCID Take 1 tablet (40 mg total) by mouth 2 (two) times daily.   Gas Relief 250 MG Caps Generic drug: Simethicone Take 1 capsule by mouth daily as needed (for flatulence).   multivitamin with minerals tablet Take 1 tablet by mouth daily.   ondansetron 8 MG tablet Commonly known as: Zofran Take 1 tablet (8 mg total) by mouth 2 (two) times daily as needed. Start on the third day after cisplatin chemotherapy.   oxyCODONE-acetaminophen 5-325 MG tablet Commonly known as: PERCOCET/ROXICET Take 1-2 tablets by mouth every 4 (four) hours as needed for severe pain.   prochlorperazine 10 MG tablet Commonly known as: COMPAZINE Take 1 tablet (10 mg total) by mouth every 6 (six) hours as needed (Nausea or vomiting).   senna-docusate 8.6-50 MG tablet Commonly known as: Senokot-S Take 2 tablets by mouth 2 (two) times  daily. What changed:  when to take this reasons to take this       No Known Allergies    The results of significant diagnostics from this hospitalization (including imaging, microbiology, ancillary and laboratory) are listed below for reference.    Significant Diagnostic Studies: DG Abd 2 Views  Result Date: 12/31/2021 CLINICAL DATA:  Acute colitis, metastatic breast cancer EXAM: ABDOMEN - 2 VIEW COMPARISON:  CT abdomen 12/26/2021 FINDINGS: Scattered sclerotic osseous metastatic disease. Gas-filled small bowel without dilatation. Relatively gasless sigmoid colon in the region of prior inflammation. No obvious signs of pneumatosis. There is some gas below the diaphragm spanning the midline, somewhat high in position on the erect view, but with a lobular configuration which seems to favor intraluminal gas over extraluminal gas. Consider a left lateral decubitus view of the abdomen for confirmation. IMPRESSION: 1. Gas in the uppermost abdomen in the midline seems to have a lobular configuration suggesting that it is intraluminal. Consider a left side down lateral decubitus projection to exclude the less likely possibility of free intraperitoneal gas. 2. Paucity of gas in the previously inflamed sigmoid region, which may be a manifestation of continued inflammation but which is technically nonspecific. 3. No overtly  dilated small bowel is currently identified. 4. Scattered sclerotic osseous metastatic disease. Electronically Signed   By: Van Clines M.D.   On: 12/31/2021 08:42   CT ABDOMEN PELVIS W CONTRAST  Result Date: 12/26/2021 CLINICAL DATA:  Abdominal pain.  History of bladder cancer EXAM: CT ABDOMEN AND PELVIS WITH CONTRAST TECHNIQUE: Multidetector CT imaging of the abdomen and pelvis was performed using the standard protocol following bolus administration of intravenous contrast. RADIATION DOSE REDUCTION: This exam was performed according to the departmental dose-optimization program  which includes automated exposure control, adjustment of the mA and/or kV according to patient size and/or use of iterative reconstruction technique. CONTRAST:  166m OMNIPAQUE IOHEXOL 300 MG/ML  SOLN COMPARISON:  CT 11/12/2021 FINDINGS: Lower chest: No acute abnormality. Hepatobiliary: Approximally 1.0 cm hypodense lesion within the right hepatic lobe (series 2, image 18), unchanged from prior. No new focal liver lesion is identified. Gallbladder within normal limits. No biliary dilatation. Pancreas: Unremarkable. No pancreatic ductal dilatation or surrounding inflammatory changes. Spleen: Normal in size without focal abnormality. Adrenals/Urinary Tract: Unremarkable adrenal glands. Kidneys enhance symmetrically. No renal stone or hydronephrosis. Diffusely thickened appearance of the urinary bladder with left-sided mucosal enhancement compatible with history of known bladder carcinoma. Stomach/Bowel: Prominent long segment circumferential thickening of the rectosigmoid colon. Slight interval decrease in the degree of pericolonic fat stranding. Numerous diverticula throughout the affected segment. Multiple mildly prominent fluid-filled loops of small bowel throughout the abdomen measuring up to 2.9 cm in diameter. No discrete transition point. Normal appendix in the right lower quadrant. Vascular/Lymphatic: Aortic atherosclerosis. No enlarged abdominal or pelvic lymph nodes. Reproductive: Uterus and bilateral adnexa are unremarkable. Other: No ascites. No pneumoperitoneum. Fat containing umbilical hernia. Musculoskeletal: Multifocal sites of osseous metastatic disease throughout the included spine, ribs, pelvis, and bilateral femurs. No definite interval progression from prior. Subacute appearing bilateral sacral fractures (series 4, image 64), which could represent insufficiency fractures or pathologic fractures related to underlying metastatic disease. IMPRESSION: 1. Prominent long segment circumferential  thickening of the rectosigmoid colon compatible with acute colitis. Slight interval decrease in the degree of pericolonic fat stranding. 2. Multiple mildly prominent fluid-filled loops of small bowel throughout the abdomen without discrete transition point. Findings may represent ileus versus developing small bowel obstruction. 3. Diffuse osseous metastatic disease without definite interval progression from prior. 4. Subacute-appearing bilateral sacral fractures which could represent insufficiency fractures or pathologic fractures related to underlying metastatic disease. 5. No apparent progression in hepatic metastatic disease. Aortic Atherosclerosis (ICD10-I70.0). Electronically Signed   By: NDavina PokeD.O.   On: 12/26/2021 13:46    Microbiology: Recent Results (from the past 240 hour(s))  Blood culture (routine x 2)     Status: None   Collection Time: 12/26/21 12:08 PM   Specimen: BLOOD  Result Value Ref Range Status   Specimen Description   Final    BLOOD RIGHT ANTECUBITAL Performed at [facility] [place] [address]   Special Requests   Final    BOTTLES DRAWN AEROBIC AND ANAEROBIC Blood Culture adequate volume Performed at [facility] [place] [address]   Culture   Final    NO GROWTH 5 DAYS Performed at [facility] [address], [place] [address]   Report Status 12/31/2021 FINAL  Final  Blood culture (routine x 2)     Status: Abnormal   Collection Time: 12/27/21  2:53 AM   Specimen: BLOOD  Result  Value Ref Range Status   Specimen Description   Final    BLOOD LEFT ANTECUBITAL Performed at [facility] [address] [place], [place] [address]    Special Requests   Final    IN PEDIATRIC BOTTLE Blood Culture results may not be optimal due to an inadequate volume of blood received in culture bottles Performed at [facility] [address], [place], [place] [address]    Culture  Setup Time   Final    GRAM POSITIVE COCCI AEROBIC BOTTLE ONLY CRITICAL RESULT CALLED TO, READ BACK BY AND VERIFIED WITH: PHARMD MICHELLE BELL 12/28/21'@2'$ :31 BY TW    Culture (A)  Final    STAPHYLOCOCCUS HOMINIS THE SIGNIFICANCE OF ISOLATING THIS ORGANISM FROM A SINGLE SET OF BLOOD CULTURES WHEN MULTIPLE SETS ARE DRAWN IS UNCERTAIN. PLEASE NOTIFY THE MICROBIOLOGY DEPARTMENT WITHIN ONE WEEK IF SPECIATION AND SENSITIVITIES ARE REQUIRED. Performed at [facility], [place] [address], [place], [place] [address]    Report Status 12/29/2021 FINAL  Final  Blood Culture ID Panel (Reflexed)     Status: Abnormal   Collection Time: 12/27/21  2:53 AM  Result Value Ref Range Status   Enterococcus faecalis NOT DETECTED NOT DETECTED Final   Enterococcus Faecium NOT DETECTED NOT DETECTED Final   Listeria monocytogenes NOT DETECTED NOT DETECTED Final   Staphylococcus species DETECTED (A) NOT DETECTED Final    Comment: CRITICAL RESULT CALLED TO, READ BACK BY AND VERIFIED WITH: PHARMD MICHELLE BELL 12/28/21'@2'$ :31 BY TW    Staphylococcus aureus (BCID) NOT DETECTED NOT DETECTED Final   Staphylococcus epidermidis NOT DETECTED NOT DETECTED Final   Staphylococcus lugdunensis NOT DETECTED NOT DETECTED Final   Streptococcus species NOT DETECTED NOT DETECTED Final   Streptococcus agalactiae NOT DETECTED NOT DETECTED Final   Streptococcus pneumoniae NOT DETECTED NOT DETECTED Final   Streptococcus pyogenes NOT DETECTED NOT DETECTED Final   A.calcoaceticus-baumannii NOT DETECTED NOT DETECTED Final   Bacteroides fragilis NOT DETECTED NOT DETECTED Final   Enterobacterales NOT DETECTED NOT DETECTED Final   Enterobacter cloacae complex NOT DETECTED NOT DETECTED Final   Escherichia coli NOT DETECTED NOT DETECTED Final   Klebsiella aerogenes NOT DETECTED NOT DETECTED Final   Klebsiella oxytoca NOT DETECTED NOT DETECTED Final   Klebsiella pneumoniae NOT DETECTED NOT  DETECTED Final   Proteus species NOT DETECTED NOT DETECTED Final   Salmonella species NOT DETECTED NOT DETECTED Final   Serratia marcescens NOT DETECTED NOT DETECTED Final   Haemophilus influenzae NOT DETECTED NOT DETECTED Final   Neisseria meningitidis NOT DETECTED NOT DETECTED Final   Pseudomonas aeruginosa NOT DETECTED NOT DETECTED Final   Stenotrophomonas maltophilia NOT DETECTED NOT DETECTED Final   Candida albicans NOT DETECTED NOT DETECTED Final   Candida auris NOT DETECTED NOT DETECTED Final   Candida glabrata NOT DETECTED NOT DETECTED Final   Candida krusei NOT DETECTED NOT DETECTED Final   Candida parapsilosis NOT DETECTED NOT DETECTED Final   Candida tropicalis NOT DETECTED NOT DETECTED Final   Cryptococcus neoformans/gattii NOT DETECTED NOT DETECTED Final    Comment: Performed at [facility], [place] [address], [place], [place] [address]  C Difficile Quick Screen w PCR reflex     Status: None   Collection Time: 12/27/21  2:17 PM   Specimen: STOOL  Result Value Ref Range Status   C Diff antigen NEGATIVE NEGATIVE Final   C Diff toxin NEGATIVE NEGATIVE Final   C Diff interpretation No C. difficile detected.  Final  Comment: Performed at [facility], [place] [address] [place], [place] [address]  Culture, blood (Routine X 2) w Reflex to ID Panel     Status: None   Collection Time: 12/28/21 10:36 AM   Specimen: BLOOD  Result Value Ref Range Status   Specimen Description   Final    BLOOD LEFT ANTECUBITAL Performed at [facility] [address], [place], [place] [address]    Special Requests   Final    BOTTLES DRAWN AEROBIC ONLY Blood Culture adequate volume Performed at [facility] [address], [place], [place] [address]    Culture   Final    NO GROWTH 5 DAYS Performed at [facility], [place] [address], [place], [place] [address]    Report Status 01/02/2022 FINAL  Final  Culture, blood (Routine X 2)  w Reflex to ID Panel     Status: None   Collection Time: 12/28/21 10:36 AM   Specimen: BLOOD LEFT HAND  Result Value Ref Range Status   Specimen Description   Final    BLOOD LEFT HAND Performed at [facility] [address] [place], [place] [address]    Special Requests   Final    IN PEDIATRIC BOTTLE Blood Culture adequate volume Performed at [facility] [address], [place], [place] [address]    Culture   Final    NO GROWTH 5 DAYS Performed at [facility], [place] [address], [place], [place] [address]    Report Status 01/02/2022 FINAL  Final     Labs: Basic Metabolic Panel: Recent Labs  Lab 12/29/21 0346 12/30/21 0348 12/31/21 0410 01/01/22 0613 01/02/22 0338 01/03/22 0454  NA 137 134* 134* 137 136  --   K 3.3* 3.2* 4.0 2.9* 3.8  --   CL 108 105 103 103 103  --   CO2 21* '22 25 27 28  '$ --   GLUCOSE 72 81 84 86 101*  --   BUN 7* 6* <5* 6* 7*  --   CREATININE 0.58 0.60 0.51 0.55 0.42*  --   CALCIUM 7.6* 8.0* 7.9* 7.8* 7.5*  --   MG 1.7 1.5* 1.7 1.3* 1.5* 1.2*   Liver Function Tests: Recent Labs  Lab 12/28/21 0349  AST 10*  ALT 9  ALKPHOS 84  BILITOT 0.8  PROT 4.8*  ALBUMIN 2.0*   No results for input(s): "LIPASE", "AMYLASE" in the last 168 hours. No results for input(s): "AMMONIA" in the last 168 hours. CBC: Recent Labs  Lab 12/29/21 0346 12/30/21 0348 12/31/21 0410 01/01/22 0613 01/02/22 0338  WBC 8.0 19.3* 19.2* 12.8* 12.1*  NEUTROABS 4.6 11.5* 10.1* 7.0 5.9  HGB 9.8* 9.1* 8.9* 9.2* 8.9*  HCT 29.3* 27.3* 27.3* 28.3* 27.4*  MCV 92.7 94.1 94.8 95.0 94.8  PLT 27* 44* 57* 75* 110*   Cardiac Enzymes: No results for input(s): "CKTOTAL", "CKMB", "CKMBINDEX", "TROPONINI" in the last 168 hours. BNP: BNP (last 3 results) No results for input(s): "BNP" in the last 8760 hours.  ProBNP (last 3 results) No results for input(s): "PROBNP" in the last 8760 hours.  CBG: No results for input(s): "GLUCAP" in the  last 168 hours.     Signed:  Nita Sells MD   [facility] 01/03/2022, 9:45 AM

## 2022-01-03 NOTE — Progress Notes (Signed)
Pt was an anticipated d/c 7/5.  Pt is still admitted and Azahel Belcastro, RN currently unaware if pt will go home today.  IVT will reaccess port unless plan is for pt to d/c today.

## 2022-01-03 NOTE — Plan of Care (Signed)
  Problem: Clinical Measurements: Goal: Diagnostic test results will improve Outcome: Progressing Goal: Respiratory complications will improve Outcome: Progressing Goal: Cardiovascular complication will be avoided Outcome: Progressing   

## 2022-01-03 NOTE — Progress Notes (Signed)
Discharge instructions were reviewed. Patient inquired about new home mediation regimen. New medications reviewed The pt was encouraged to also follow up with oncologist and primary care providers. No change from am assessment.

## 2022-01-08 ENCOUNTER — Inpatient Hospital Stay: Payer: Medicare PPO | Admitting: Hematology & Oncology

## 2022-01-08 ENCOUNTER — Inpatient Hospital Stay: Payer: Medicare PPO

## 2022-01-08 ENCOUNTER — Inpatient Hospital Stay: Payer: Medicare PPO | Attending: Family Medicine

## 2022-01-08 ENCOUNTER — Encounter: Payer: Self-pay | Admitting: Hematology & Oncology

## 2022-01-08 VITALS — BP 158/93 | HR 80 | Temp 97.9°F | Resp 19 | Wt 139.4 lb

## 2022-01-08 DIAGNOSIS — C679 Malignant neoplasm of bladder, unspecified: Secondary | ICD-10-CM | POA: Diagnosis not present

## 2022-01-08 DIAGNOSIS — K529 Noninfective gastroenteritis and colitis, unspecified: Secondary | ICD-10-CM | POA: Diagnosis not present

## 2022-01-08 DIAGNOSIS — C787 Secondary malignant neoplasm of liver and intrahepatic bile duct: Secondary | ICD-10-CM | POA: Diagnosis not present

## 2022-01-08 DIAGNOSIS — D509 Iron deficiency anemia, unspecified: Secondary | ICD-10-CM | POA: Diagnosis not present

## 2022-01-08 DIAGNOSIS — C7951 Secondary malignant neoplasm of bone: Secondary | ICD-10-CM | POA: Insufficient documentation

## 2022-01-08 DIAGNOSIS — Z5112 Encounter for antineoplastic immunotherapy: Secondary | ICD-10-CM | POA: Diagnosis not present

## 2022-01-08 DIAGNOSIS — Z95828 Presence of other vascular implants and grafts: Secondary | ICD-10-CM

## 2022-01-08 LAB — LACTATE DEHYDROGENASE: LDH: 192 U/L (ref 98–192)

## 2022-01-08 LAB — CBC WITH DIFFERENTIAL (CANCER CENTER ONLY)
Abs Immature Granulocytes: 0.08 10*3/uL — ABNORMAL HIGH (ref 0.00–0.07)
Basophils Absolute: 0 10*3/uL (ref 0.0–0.1)
Basophils Relative: 1 %
Eosinophils Absolute: 0 10*3/uL (ref 0.0–0.5)
Eosinophils Relative: 1 %
HCT: 31.1 % — ABNORMAL LOW (ref 36.0–46.0)
Hemoglobin: 10 g/dL — ABNORMAL LOW (ref 12.0–15.0)
Immature Granulocytes: 1 %
Lymphocytes Relative: 14 %
Lymphs Abs: 0.8 10*3/uL (ref 0.7–4.0)
MCH: 31.4 pg (ref 26.0–34.0)
MCHC: 32.2 g/dL (ref 30.0–36.0)
MCV: 97.8 fL (ref 80.0–100.0)
Monocytes Absolute: 1 10*3/uL (ref 0.1–1.0)
Monocytes Relative: 17 %
Neutro Abs: 3.7 10*3/uL (ref 1.7–7.7)
Neutrophils Relative %: 66 %
Platelet Count: 293 10*3/uL (ref 150–400)
RBC: 3.18 MIL/uL — ABNORMAL LOW (ref 3.87–5.11)
RDW: 18.2 % — ABNORMAL HIGH (ref 11.5–15.5)
WBC Count: 5.6 10*3/uL (ref 4.0–10.5)
nRBC: 0 % (ref 0.0–0.2)

## 2022-01-08 LAB — CMP (CANCER CENTER ONLY)
ALT: 6 U/L (ref 0–44)
AST: 15 U/L (ref 15–41)
Albumin: 2.9 g/dL — ABNORMAL LOW (ref 3.5–5.0)
Alkaline Phosphatase: 115 U/L (ref 38–126)
Anion gap: 7 (ref 5–15)
BUN: 12 mg/dL (ref 8–23)
CO2: 28 mmol/L (ref 22–32)
Calcium: 8.6 mg/dL — ABNORMAL LOW (ref 8.9–10.3)
Chloride: 101 mmol/L (ref 98–111)
Creatinine: 0.61 mg/dL (ref 0.44–1.00)
GFR, Estimated: >60 mL/min (ref >60)
Glucose, Bld: 93 mg/dL (ref 70–99)
Potassium: 3.5 mmol/L (ref 3.5–5.1)
Sodium: 136 mmol/L (ref 135–145)
Total Bilirubin: 0.4 mg/dL (ref 0.3–1.2)
Total Protein: 5.1 g/dL — ABNORMAL LOW (ref 6.5–8.1)

## 2022-01-08 LAB — SAMPLE TO BLOOD BANK

## 2022-01-08 MED ORDER — HEPARIN SOD (PORK) LOCK FLUSH 100 UNIT/ML IV SOLN
500.0000 [IU] | Freq: Once | INTRAVENOUS | Status: AC
  Administered 2022-01-08: 500 [IU] via INTRAVENOUS

## 2022-01-08 MED ORDER — SODIUM CHLORIDE 0.9% FLUSH
10.0000 mL | INTRAVENOUS | Status: AC | PRN
  Administered 2022-01-08: 10 mL via INTRAVENOUS

## 2022-01-08 NOTE — Progress Notes (Signed)
DISCONTINUE ON PATHWAY REGIMEN - Bladder     A cycle is every 21 days:     Gemcitabine      Cisplatin   **Always confirm dose/schedule in your pharmacy ordering system**  REASON: Toxicities / Adverse Event PRIOR TREATMENT: [id number]: Gemcitabine 1,000 mg/m2 D1, 8 + Cisplatin 70 mg/m2 D1 q21 Days for a Maximum of 6 Cycles TREATMENT RESPONSE: Partial Response (PR)  START OFF PATHWAY REGIMEN - Bladder   [id number]:Atezolizumab 1,200 mg IV D1 q21 Days:   A cycle is every 21 days:     Atezolizumab   **Always confirm dose/schedule in your pharmacy ordering system**  Patient Characteristics: Advanced/Metastatic Disease, Second Line, [id number] Mutation Positive Therapeutic Status: Advanced/Metastatic Disease Line of Therapy: Second Line FGFR2/FGFR3 Mutation Status: Positive Intent of Therapy: Non-Curative / Palliative Intent, Discussed with Patient

## 2022-01-08 NOTE — Progress Notes (Signed)
Patient does not getting treated today per Dr. Marin Olp.

## 2022-01-08 NOTE — Progress Notes (Signed)
Hematology and Oncology Follow Up Visit  Cheryl Singh [id number] 1951/05/01 71 y.o. 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 Dattilo is back for follow-up.  Unfortunately, she was hospitalized again.  She had another bout of colitis/diverticulitis.  As such, we are going to stop the chemotherapy.  I am unsure of the chemotherapy has anything to do with the diarrhea.  However, I really do not think that we need to give her chemotherapy at this point.  She seems to have responded to treatment so far by her last scans.  She is feeling a lot better right now.  She has had no problems with diarrhea.  She has had no pain.  Maybe an occasional pain in the left lower quadrant.  I know that she has had problems with urinary tract infections.  There is been no issues with fever.  She has had no bleeding.  She does have chronic anemia.  There is been no back pain.  She is getting around with a walker.  She and her friends [place] 10 [place] or [place].  I am sure that they will have a wonderful time.  Overall, I would say performance status for now is ECOG 1.  Still she was actually hospitalized back in May.  She was there for over a week.  She came in with what looks like colitis.  This seemed to be the cause of her abdominal pain.  The colitis was seen initially on a PET scan.  She had a CT scan which showed the acute colitis in the sigmoid and rectal portions of the large intestine.  She was placed on antibiotics.  She began to improve.  I think we probably did transfuse her in the  hospital.  She also had a Klebsiella urinary tract infection.  We did do a PET scan on her after her third cycle of treatment.  The PET scan seem to show that she was responding.  She really wants to continue on chemotherapy.  We will go ahead and make some dosage adjustments for her.  She is not hurting as much with the bony issues.  I know she has a lot of bony metastasis.  We have given her Zometa in the past.  She will be due for this month.  She is lost some weight.  She still is trying to recover from this hospitalization with colitis.  She has had no cough.  There is been no shortness of breath.  She does have anxiety over her situation.  I totally understand this.  She has had no problems with bleeding.  There is been no leg swelling.  Currently, I would have to say that her performance status is probably ECOG 2.    Medications:  Current Outpatient Medications:    amoxicillin-clavulanate (AUGMENTIN) 875-125 MG tablet, Take 1 tablet by mouth every 12 (twelve) hours for 6 days., Disp: 12 tablet, Rfl: 0   atorvastatin (LIPITOR) 10 MG tablet, Take 10 mg by mouth every evening., Disp: , Rfl:    celecoxib (CELEBREX) 200 MG capsule, Take 1 capsule (200 mg  total) by mouth 2 (two) times daily., Disp: 60 capsule, Rfl: 4   famotidine (PEPCID) 40 MG tablet, Take 1 tablet (40 mg total) by mouth 2 (two) times daily., Disp: 60 tablet, Rfl: 4   Multiple Vitamins-Minerals (MULTIVITAMIN WITH MINERALS) tablet, Take 1 tablet by mouth daily., Disp: , Rfl:    oxyCODONE-acetaminophen (PERCOCET/ROXICET) 5-325 MG tablet, Take 1-2 tablets by mouth every 4 (four) hours as needed for severe pain., Disp: 60 tablet, Rfl: 0   Simethicone (GAS RELIEF) 250 MG CAPS, Take 1 capsule by mouth daily as needed (for flatulence)., Disp: , Rfl:    senna-docusate (SENOKOT-S) 8.6-50 MG tablet, Take 2 tablets by mouth 2 (two) times daily. (Patient not taking: Reported on 01/08/2022), Disp: 60 tablet, Rfl: 1 No current  facility-administered medications for this visit.  Facility-Administered Medications Ordered in Other Visits:    heparin lock flush 100 unit/mL, 500 Units, Intravenous, Once, Sharanya Templin, Rudell Cobb, MD   sodium chloride flush (NS) 0.9 % injection 10 mL, 10 mL, Intravenous, PRN, Marin Olp, Rudell Cobb, MD  Allergies: No Known Allergies  Past Medical History, Surgical history, Social history, and Family History were reviewed and updated.  Review of Systems: Review of Systems  Constitutional:  Positive for fatigue.  HENT:  Negative.    Eyes: Negative.   Respiratory: Negative.    Cardiovascular:  Positive for palpitations.  Gastrointestinal:  Positive for nausea.  Endocrine: Negative.   Genitourinary: Negative.    Musculoskeletal:  Positive for back pain.  Neurological: Negative.   Hematological: Negative.   Psychiatric/Behavioral: Negative.      Physical Exam:  weight is 139 lb 6 oz (63.2 kg). Her oral temperature is 97.9 F (36.6 C). Her blood pressure is 158/93 (abnormal) and her pulse is 80. Her respiration is 19 and oxygen saturation is 96%.   Wt Readings from Last 3 Encounters:  01/08/22 139 lb 6 oz (63.2 kg)  01/03/22 138 lb 0.1 oz (62.6 kg)  12/11/21 139 lb 4 oz (63.2 kg)    Physical Exam Vitals reviewed.  HENT:     Head: Normocephalic and atraumatic.  Eyes:     Pupils: Pupils are equal, round, and reactive to light.  Cardiovascular:     Rate and Rhythm: Normal rate and regular rhythm.     Heart sounds: Normal heart sounds.  Pulmonary:     Effort: Pulmonary effort is normal.     Breath sounds: Normal breath sounds.  Abdominal:     General: Bowel sounds are normal.     Palpations: Abdomen is soft.  Musculoskeletal:        General: No tenderness or deformity. Normal range of motion.     Cervical back: Normal range of motion.  Lymphadenopathy:     Cervical: No cervical adenopathy.  Skin:    General: Skin is warm and dry.     Findings: No erythema or rash.   Neurological:     Mental Status: She is alert and oriented to person, place, and time.  Psychiatric:        Behavior: Behavior normal.        Thought Content: Thought content normal.        Judgment: Judgment normal.      Lab Results  Component Value Date   WBC 5.6 01/08/2022   HGB 10.0 (L) 01/08/2022   HCT 31.1 (L) 01/08/2022   MCV 97.8 01/08/2022   PLT 293 01/08/2022     Chemistry      Component Value Date/Time  NA 136 01/08/2022 0830   K 3.5 01/08/2022 0830   CL 101 01/08/2022 0830   CO2 28 01/08/2022 0830   BUN 12 01/08/2022 0830   CREATININE 0.61 01/08/2022 0830      Component Value Date/Time   CALCIUM 8.6 (L) 01/08/2022 0830   ALKPHOS 115 01/08/2022 0830   AST 15 01/08/2022 0830   ALT 6 01/08/2022 0830   BILITOT 0.4 01/08/2022 0830      Impression and Plan: Ms. Zahm is a very charming 71 year old white female.  She has metastatic bladder cancer.  So far, she has had 4 cycles of chemotherapy.  We have had to make some dosage adjustments.  She has had blood transfusions.  She was hospitalized with a colitis.   Again, we will make a change in treatment.  I will try her on Tecentriq.  I think we go with Tecentriq by itself.  Hopefully, this will allow [place] to have a continued response.  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   I would not think that the Tecentriq should cause problems with diarrhea or colitis.  Again we will have to watch this very closely.  We will see about starting next week.  I probably would give her 3 cycles and then repeat her PET scan.  Thankfully, she is improving.  We do not have to give her a transfusion.  Just happy that her quality of life is better.  We will start the Tecentriq next week.  I will plan to see her back in 4 weeks which be the start of her second cycle.    Volanda Napoleon, MD 7/11/20239:26 AM

## 2022-01-08 NOTE — Addendum Note (Signed)
Addended by: Melton Krebs on: 01/08/2022 09:43 AM   Modules accepted: Orders

## 2022-01-09 ENCOUNTER — Encounter: Payer: Self-pay | Admitting: *Deleted

## 2022-01-09 NOTE — Progress Notes (Signed)
Patient had a recent hospitalization for colitis. This is her second admission for this. Dr Marin Olp will discontinue her current regimen and start her on a new treatment as patient wishes to continue treatment. She will begin next week.   Oncology Nurse Navigator Documentation     01/09/2022   10:00 AM  Oncology Nurse Navigator Flowsheets  Navigator Follow Up Date: 02/05/2022  Navigator Follow Up Reason: Follow-up Appointment;Chemotherapy  Navigator Location [facility]  Navigator Encounter Type Appt/Treatment Plan Review  Patient Visit Type MedOnc  Treatment Phase Active Tx  Barriers/Navigation Needs Coordination of Care;Education  Interventions None Required  Acuity Level 2-Minimal Needs (1-2 Barriers Identified)  Support Groups/Services Friends and Family  Time Spent with Patient 15

## 2022-01-09 NOTE — Progress Notes (Signed)
Pharmacist Chemotherapy Monitoring - Initial Assessment    Anticipated start date: 01/15/22   The following has been reviewed per standard work regarding the patient's treatment regimen: The patient's diagnosis, treatment plan and drug doses, and organ/hematologic function Lab orders and baseline tests specific to treatment regimen  The treatment plan start date, drug sequencing, and pre-medications Prior authorization status  Patient's documented medication list, including drug-drug interaction screen and prescriptions for anti-emetics and supportive care specific to the treatment regimen The drug concentrations, fluid compatibility, administration routes, and timing of the medications to be used The patient's access for treatment and lifetime cumulative dose history, if applicable  The patient's medication allergies and previous infusion related reactions, if applicable   Changes made to treatment plan:  N/A  Follow up needed:  N/A   Claybon Jabs, [facility], 01/09/2022  12:37 PM

## 2022-01-14 ENCOUNTER — Other Ambulatory Visit: Payer: Self-pay | Admitting: *Deleted

## 2022-01-14 DIAGNOSIS — C7951 Secondary malignant neoplasm of bone: Secondary | ICD-10-CM

## 2022-01-14 MED ORDER — OXYCODONE-ACETAMINOPHEN 5-325 MG PO TABS: 1.0000 | ORAL_TABLET | ORAL | 0 refills | Status: DC | PRN

## 2022-01-15 ENCOUNTER — Inpatient Hospital Stay: Payer: Medicare PPO

## 2022-01-15 VITALS — BP 158/67 | HR 87 | Resp 18

## 2022-01-15 DIAGNOSIS — K529 Noninfective gastroenteritis and colitis, unspecified: Secondary | ICD-10-CM | POA: Diagnosis not present

## 2022-01-15 DIAGNOSIS — C7951 Secondary malignant neoplasm of bone: Secondary | ICD-10-CM | POA: Diagnosis not present

## 2022-01-15 DIAGNOSIS — D509 Iron deficiency anemia, unspecified: Secondary | ICD-10-CM | POA: Diagnosis not present

## 2022-01-15 DIAGNOSIS — C787 Secondary malignant neoplasm of liver and intrahepatic bile duct: Secondary | ICD-10-CM | POA: Diagnosis not present

## 2022-01-15 DIAGNOSIS — Z5112 Encounter for antineoplastic immunotherapy: Secondary | ICD-10-CM | POA: Diagnosis not present

## 2022-01-15 DIAGNOSIS — C679 Malignant neoplasm of bladder, unspecified: Secondary | ICD-10-CM

## 2022-01-15 LAB — CBC WITH DIFFERENTIAL (CANCER CENTER ONLY)
Abs Immature Granulocytes: 0.02 10*3/uL (ref 0.00–0.07)
Basophils Absolute: 0 10*3/uL (ref 0.0–0.1)
Basophils Relative: 1 %
Eosinophils Absolute: 0 10*3/uL (ref 0.0–0.5)
Eosinophils Relative: 0 %
HCT: 29.5 % — ABNORMAL LOW (ref 36.0–46.0)
Hemoglobin: 9.6 g/dL — ABNORMAL LOW (ref 12.0–15.0)
Immature Granulocytes: 0 %
Lymphocytes Relative: 13 %
Lymphs Abs: 0.6 10*3/uL — ABNORMAL LOW (ref 0.7–4.0)
MCH: 32 pg (ref 26.0–34.0)
MCHC: 32.5 g/dL (ref 30.0–36.0)
MCV: 98.3 fL (ref 80.0–100.0)
Monocytes Absolute: 0.9 10*3/uL (ref 0.1–1.0)
Monocytes Relative: 17 %
Neutro Abs: 3.4 10*3/uL (ref 1.7–7.7)
Neutrophils Relative %: 69 %
Platelet Count: 224 10*3/uL (ref 150–400)
RBC: 3 MIL/uL — ABNORMAL LOW (ref 3.87–5.11)
RDW: 19.3 % — ABNORMAL HIGH (ref 11.5–15.5)
WBC Count: 4.9 10*3/uL (ref 4.0–10.5)
nRBC: 0 % (ref 0.0–0.2)

## 2022-01-15 LAB — CMP (CANCER CENTER ONLY)
ALT: 6 U/L (ref 0–44)
AST: 14 U/L — ABNORMAL LOW (ref 15–41)
Albumin: 2.8 g/dL — ABNORMAL LOW (ref 3.5–5.0)
Alkaline Phosphatase: 129 U/L — ABNORMAL HIGH (ref 38–126)
Anion gap: 6 (ref 5–15)
BUN: 11 mg/dL (ref 8–23)
CO2: 29 mmol/L (ref 22–32)
Calcium: 8.5 mg/dL — ABNORMAL LOW (ref 8.9–10.3)
Chloride: 99 mmol/L (ref 98–111)
Creatinine: 0.67 mg/dL (ref 0.44–1.00)
GFR, Estimated: >60 mL/min (ref >60)
Glucose, Bld: 110 mg/dL — ABNORMAL HIGH (ref 70–99)
Potassium: 3.8 mmol/L (ref 3.5–5.1)
Sodium: 134 mmol/L — ABNORMAL LOW (ref 135–145)
Total Bilirubin: 0.4 mg/dL (ref 0.3–1.2)
Total Protein: 5 g/dL — ABNORMAL LOW (ref 6.5–8.1)

## 2022-01-15 LAB — MAGNESIUM: Magnesium: 1.5 mg/dL — ABNORMAL LOW (ref 1.7–2.4)

## 2022-01-15 MED ORDER — LORAZEPAM 0.5 MG PO TABS: 0.5000 mg | ORAL_TABLET | Freq: Four times a day (QID) | ORAL | 0 refills | Status: DC | PRN

## 2022-01-15 MED ORDER — SODIUM CHLORIDE 0.9 % IV SOLN
1200.0000 mg | Freq: Once | INTRAVENOUS | Status: AC
  Administered 2022-01-15: 1200 mg via INTRAVENOUS
  Filled 2022-01-15: qty 20

## 2022-01-15 MED ORDER — ONDANSETRON HCL 8 MG PO TABS: 8.0000 mg | ORAL_TABLET | Freq: Two times a day (BID) | ORAL | 1 refills | Status: DC | PRN

## 2022-01-15 MED ORDER — SODIUM CHLORIDE 0.9% FLUSH
10.0000 mL | INTRAVENOUS | Status: DC | PRN
  Administered 2022-01-15: 10 mL

## 2022-01-15 MED ORDER — HEPARIN SOD (PORK) LOCK FLUSH 100 UNIT/ML IV SOLN
500.0000 [IU] | Freq: Once | INTRAVENOUS | Status: AC | PRN
  Administered 2022-01-15: 500 [IU]

## 2022-01-15 MED ORDER — SODIUM CHLORIDE 0.9 % IV SOLN: Freq: Once | INTRAVENOUS | Status: AC

## 2022-01-15 MED ORDER — PROCHLORPERAZINE MALEATE 10 MG PO TABS: 10.0000 mg | ORAL_TABLET | Freq: Four times a day (QID) | ORAL | 1 refills | Status: DC | PRN

## 2022-01-15 NOTE — Progress Notes (Signed)
Pt d/c via wheelchair

## 2022-01-15 NOTE — Patient Instructions (Signed)

## 2022-01-24 ENCOUNTER — Other Ambulatory Visit: Payer: Self-pay | Admitting: *Deleted

## 2022-01-24 MED ORDER — OXYCODONE-ACETAMINOPHEN 5-325 MG PO TABS: 1.0000 | ORAL_TABLET | ORAL | 0 refills | Status: DC | PRN

## 2022-02-02 ENCOUNTER — Other Ambulatory Visit: Payer: Self-pay | Admitting: Hematology & Oncology

## 2022-02-04 ENCOUNTER — Encounter: Payer: Self-pay | Admitting: Hematology & Oncology

## 2022-02-05 ENCOUNTER — Inpatient Hospital Stay: Payer: Medicare PPO

## 2022-02-05 ENCOUNTER — Inpatient Hospital Stay: Payer: Medicare PPO | Attending: Family Medicine | Admitting: Hematology & Oncology

## 2022-02-05 ENCOUNTER — Encounter: Payer: Self-pay | Admitting: *Deleted

## 2022-02-05 ENCOUNTER — Encounter: Payer: Self-pay | Admitting: Hematology & Oncology

## 2022-02-05 VITALS — BP 128/72 | HR 79

## 2022-02-05 VITALS — BP 137/85 | HR 110 | Temp 97.7°F | Resp 18 | Ht 64.5 in | Wt 136.2 lb

## 2022-02-05 DIAGNOSIS — Z5112 Encounter for antineoplastic immunotherapy: Secondary | ICD-10-CM | POA: Diagnosis not present

## 2022-02-05 DIAGNOSIS — C679 Malignant neoplasm of bladder, unspecified: Secondary | ICD-10-CM

## 2022-02-05 DIAGNOSIS — C787 Secondary malignant neoplasm of liver and intrahepatic bile duct: Secondary | ICD-10-CM | POA: Insufficient documentation

## 2022-02-05 DIAGNOSIS — Z79899 Other long term (current) drug therapy: Secondary | ICD-10-CM | POA: Diagnosis not present

## 2022-02-05 DIAGNOSIS — D509 Iron deficiency anemia, unspecified: Secondary | ICD-10-CM | POA: Diagnosis not present

## 2022-02-05 DIAGNOSIS — C7951 Secondary malignant neoplasm of bone: Secondary | ICD-10-CM | POA: Insufficient documentation

## 2022-02-05 LAB — RETICULOCYTES
Immature Retic Fract: 14.5 % (ref 2.3–15.9)
RBC.: 3.01 MIL/uL — ABNORMAL LOW (ref 3.87–5.11)
Retic Count, Absolute: 78 10*3/uL (ref 19.0–186.0)
Retic Ct Pct: 2.6 % (ref 0.4–3.1)

## 2022-02-05 LAB — CMP (CANCER CENTER ONLY)
ALT: 9 U/L (ref 0–44)
AST: 16 U/L (ref 15–41)
Albumin: 2.8 g/dL — ABNORMAL LOW (ref 3.5–5.0)
Alkaline Phosphatase: 137 U/L — ABNORMAL HIGH (ref 38–126)
Anion gap: 7 (ref 5–15)
BUN: 13 mg/dL (ref 8–23)
CO2: 27 mmol/L (ref 22–32)
Calcium: 8.3 mg/dL — ABNORMAL LOW (ref 8.9–10.3)
Chloride: 101 mmol/L (ref 98–111)
Creatinine: 0.73 mg/dL (ref 0.44–1.00)
GFR, Estimated: >60 mL/min (ref >60)
Glucose, Bld: 91 mg/dL (ref 70–99)
Potassium: 3.9 mmol/L (ref 3.5–5.1)
Sodium: 135 mmol/L (ref 135–145)
Total Bilirubin: 0.4 mg/dL (ref 0.3–1.2)
Total Protein: 4.8 g/dL — ABNORMAL LOW (ref 6.5–8.1)

## 2022-02-05 LAB — IRON AND IRON BINDING CAPACITY (CC-WL,HP ONLY)
Iron: 125 ug/dL (ref 28–170)
TIBC: 111 ug/dL — ABNORMAL LOW (ref 250–450)

## 2022-02-05 LAB — CBC WITH DIFFERENTIAL (CANCER CENTER ONLY)
Abs Immature Granulocytes: 0.07 10*3/uL (ref 0.00–0.07)
Basophils Absolute: 0 10*3/uL (ref 0.0–0.1)
Basophils Relative: 0 %
Eosinophils Absolute: 0.1 10*3/uL (ref 0.0–0.5)
Eosinophils Relative: 1 %
HCT: 30.6 % — ABNORMAL LOW (ref 36.0–46.0)
Hemoglobin: 9.9 g/dL — ABNORMAL LOW (ref 12.0–15.0)
Immature Granulocytes: 1 %
Lymphocytes Relative: 12 %
Lymphs Abs: 0.7 10*3/uL (ref 0.7–4.0)
MCH: 33.9 pg (ref 26.0–34.0)
MCHC: 32.4 g/dL (ref 30.0–36.0)
MCV: 104.8 fL — ABNORMAL HIGH (ref 80.0–100.0)
Monocytes Absolute: 1.1 10*3/uL — ABNORMAL HIGH (ref 0.1–1.0)
Monocytes Relative: 19 %
Neutro Abs: 4 10*3/uL (ref 1.7–7.7)
Neutrophils Relative %: 67 %
Platelet Count: 248 10*3/uL (ref 150–400)
RBC: 2.92 MIL/uL — ABNORMAL LOW (ref 3.87–5.11)
RDW: 19.6 % — ABNORMAL HIGH (ref 11.5–15.5)
WBC Count: 6 10*3/uL (ref 4.0–10.5)
nRBC: 0 % (ref 0.0–0.2)

## 2022-02-05 LAB — TSH: TSH: 3.621 u[IU]/mL (ref 0.350–4.500)

## 2022-02-05 LAB — FERRITIN: Ferritin: 1061 ng/mL — ABNORMAL HIGH (ref 11–307)

## 2022-02-05 LAB — LACTATE DEHYDROGENASE: LDH: 133 U/L (ref 98–192)

## 2022-02-05 MED ORDER — HEPARIN SOD (PORK) LOCK FLUSH 100 UNIT/ML IV SOLN
500.0000 [IU] | Freq: Once | INTRAVENOUS | Status: AC | PRN
  Administered 2022-02-05: 500 [IU]

## 2022-02-05 MED ORDER — SODIUM CHLORIDE 0.9 % IV SOLN: Freq: Once | INTRAVENOUS | Status: AC

## 2022-02-05 MED ORDER — SODIUM CHLORIDE 0.9 % IV SOLN
1200.0000 mg | Freq: Once | INTRAVENOUS | Status: AC
  Administered 2022-02-05: 1200 mg via INTRAVENOUS
  Filled 2022-02-05: qty 20

## 2022-02-05 MED ORDER — SODIUM CHLORIDE 0.9% FLUSH
10.0000 mL | INTRAVENOUS | Status: DC | PRN
  Administered 2022-02-05: 10 mL

## 2022-02-05 NOTE — Patient Instructions (Signed)

## 2022-02-05 NOTE — Progress Notes (Unsigned)
Patient is doing well on her new treatment with minimal side effects. Will need scans after her next cycle.   Oncology Nurse Navigator Documentation     02/05/2022   12:00 PM  Oncology Nurse Navigator Flowsheets  Navigator Follow Up Date: 02/26/2022  Navigator Follow Up Reason: Follow-up Appointment;Chemotherapy  Navigator Location [facility]  Navigator Encounter Type Treatment;Appt/Treatment Plan Review  Patient Visit Type MedOnc  Treatment Phase Active Tx  Barriers/Navigation Needs Coordination of Care;Education  Interventions Psycho-Social Support  Acuity Level 2-Minimal Needs (1-2 Barriers Identified)  Support Groups/Services Friends and Family  Time Spent with Patient 15

## 2022-02-05 NOTE — Patient Instructions (Signed)
[facility]  Discharge Instructions: Thank you for choosing [facility] to provide your oncology and hematology care.   If you have a lab appointment with the [facility], please go directly to the [facility] and check in at the registration area.  Wear comfortable clothing and clothing appropriate for easy access to any Portacath or PICC line.   We strive to give you quality time with your provider. You may need to reschedule your appointment if you arrive late (15 or more minutes).  Arriving late affects you and other patients whose appointments are after yours.  Also, if you miss three or more appointments without notifying the office, you may be dismissed from the clinic at the provider's discretion.      For prescription refill requests, have your pharmacy contact our office and allow 72 hours for refills to be completed.    Today you received the following chemotherapy and/or immunotherapy agents tecentriq   To help prevent nausea and vomiting after your treatment, we encourage you to take your nausea medication as directed.  BELOW ARE SYMPTOMS THAT SHOULD BE REPORTED IMMEDIATELY: *FEVER GREATER THAN 100.4 F (38 C) OR HIGHER *CHILLS OR SWEATING *NAUSEA AND VOMITING THAT IS NOT CONTROLLED WITH YOUR NAUSEA MEDICATION *UNUSUAL SHORTNESS OF BREATH *UNUSUAL BRUISING OR BLEEDING *URINARY PROBLEMS (pain or burning when urinating, or frequent urination) *BOWEL PROBLEMS (unusual diarrhea, constipation, pain near the anus) TENDERNESS IN MOUTH AND THROAT WITH OR WITHOUT PRESENCE OF ULCERS (sore throat, sores in mouth, or a toothache) UNUSUAL RASH, SWELLING OR PAIN  UNUSUAL VAGINAL DISCHARGE OR ITCHING   Items with * indicate a potential emergency and should be followed up as soon as possible or go to the Emergency Department if any problems should occur.  Please show the CHEMOTHERAPY ALERT CARD or IMMUNOTHERAPY ALERT CARD at check-in to the  Emergency Department and triage nurse. Should you have questions after your visit or need to cancel or reschedule your appointment, please contact [facility]  [phone] and follow the prompts.  Office hours are 8:00 a.m. to 4:30 p.m. Monday - Friday. Please note that voicemails left after 4:00 p.m. may not be returned until the following business day.  We are closed weekends and major holidays. You have access to a nurse at all times for urgent questions. Please call the main number to the clinic [phone] and follow the prompts.  For any non-urgent questions, you may also contact your provider using MyChart. We now offer e-Visits for anyone 19 and older to request care online for non-urgent symptoms. For details visit [email or web address].   Also download the MyChart app! Go to the app store, search "MyChart", open the app, select [facility], and log in with your MyChart username and password.  Masks are optional in the cancer centers. If you would like for your care team to wear a mask while they are taking care of you, please let them know. You may have one support person who is at least 70 years old accompany you for your appointments.

## 2022-02-05 NOTE — Progress Notes (Signed)
Hematology and Oncology Follow Up Visit  Cheryl Singh [id number] 10-11-1950 71 y.o. 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  She does look quite good.  Hopefully, the immunotherapy is helping her.  She really has had no side effects from it.  There is been no diarrhea.  She has had no rash.  She has had no nausea or vomiting.  There has been no cough or shortness of breath.  She still has some back issues.  She had radiation therapy to her back.  I know she has had bony involvement by her malignancy.  Her appetite is doing pretty well.  She has had no bleeding.  There is been no leg swelling.  She has had no fever.  She has had no mouth sores.  Overall, I would say performance status is probably ECOG 2.      Medications:  Current Outpatient Medications:    atorvastatin (LIPITOR) 10 MG tablet, Take 10 mg by mouth every evening., Disp: , Rfl:    celecoxib (CELEBREX) 200 MG capsule, Take 1 capsule (200 mg total) by mouth 2 (two) times daily., Disp: 60 capsule, Rfl: 4   famotidine (PEPCID) 40 MG tablet, Take 1 tablet (40 mg total) by mouth 2 (two) times daily., Disp: 60 tablet, Rfl: 4   Multiple Vitamins-Minerals (MULTIVITAMIN WITH MINERALS) tablet, Take 1 tablet by mouth daily., Disp: , Rfl:    oxyCODONE-acetaminophen (PERCOCET/ROXICET) 5-325 MG tablet, Take 1-2 tablets by mouth every 4 (four) hours as needed for severe pain., Disp: 60 tablet, Rfl: 0   Simethicone (GAS RELIEF) 250 MG CAPS, Take 1 capsule by mouth daily as needed (for  flatulence)., Disp: , Rfl:    diltiazem (CARDIZEM) 30 MG tablet, TAKE 2 TABLETS (60 MG TOTAL) BY MOUTH 3 (THREE) TIMES DAILY., Disp: 540 tablet, Rfl: 3   LORazepam (ATIVAN) 0.5 MG tablet, Take 1 tablet (0.5 mg total) by mouth every 6 (six) hours as needed (Nausea or vomiting). (Patient not taking: Reported on 02/05/2022), Disp: 30 tablet, Rfl: 0   ondansetron (ZOFRAN) 8 MG tablet, Take 1 tablet (8 mg total) by mouth 2 (two) times daily as needed (Nausea or vomiting). (Patient not taking: Reported on 02/05/2022), Disp: 30 tablet, Rfl: 1   prochlorperazine (COMPAZINE) 10 MG tablet, Take 1 tablet (10 mg total) by mouth every 6 (six) hours as needed (Nausea or vomiting). (Patient not taking: Reported on 02/05/2022), Disp: 30 tablet, Rfl: 1   senna-docusate (SENOKOT-S) 8.6-50 MG tablet, Take 2 tablets by mouth 2 (two) times daily., Disp: 60 tablet, Rfl: 1 No current facility-administered medications for this visit.  Facility-Administered Medications Ordered in Other Visits:    sodium chloride flush (NS) 0.9 % injection 10 mL, 10 mL, Intravenous, PRN, Volanda Napoleon, MD, 10 mL at 01/08/22 3254  Allergies: No Known Allergies  Past Medical History, Surgical history, Social history, and Family History were reviewed and updated.  Review of Systems: Review of Systems  Constitutional:  Positive for fatigue.  HENT:  Negative.    Eyes: Negative.  Respiratory: Negative.    Cardiovascular:  Positive for palpitations.  Gastrointestinal:  Positive for nausea.  Endocrine: Negative.   Genitourinary: Negative.    Musculoskeletal:  Positive for back pain.  Neurological: Negative.   Hematological: Negative.   Psychiatric/Behavioral: Negative.      Physical Exam:  height is 5' 4.5" (1.638 m) and weight is 136 lb 4 oz (61.8 kg). Her oral temperature is 97.7 F (36.5 C). Her blood pressure is 137/85 and her pulse is 110 (abnormal). Her respiration is 18 and oxygen saturation is 98%.   Wt Readings from Last 3  Encounters:  02/05/22 136 lb 4 oz (61.8 kg)  01/08/22 139 lb 6 oz (63.2 kg)  01/03/22 138 lb 0.1 oz (62.6 kg)    Physical Exam Vitals reviewed.  HENT:     Head: Normocephalic and atraumatic.  Eyes:     Pupils: Pupils are equal, round, and reactive to light.  Cardiovascular:     Rate and Rhythm: Normal rate and regular rhythm.     Heart sounds: Normal heart sounds.  Pulmonary:     Effort: Pulmonary effort is normal.     Breath sounds: Normal breath sounds.  Abdominal:     General: Bowel sounds are normal.     Palpations: Abdomen is soft.  Musculoskeletal:        General: No tenderness or deformity. Normal range of motion.     Cervical back: Normal range of motion.  Lymphadenopathy:     Cervical: No cervical adenopathy.  Skin:    General: Skin is warm and dry.     Findings: No erythema or rash.  Neurological:     Mental Status: She is alert and oriented to person, place, and time.  Psychiatric:        Behavior: Behavior normal.        Thought Content: Thought content normal.        Judgment: Judgment normal.      Lab Results  Component Value Date   WBC 6.0 02/05/2022   HGB 9.9 (L) 02/05/2022   HCT 30.6 (L) 02/05/2022   MCV 104.8 (H) 02/05/2022   PLT 248 02/05/2022     Chemistry      Component Value Date/Time   NA 135 02/05/2022 1025   K 3.9 02/05/2022 1025   CL 101 02/05/2022 1025   CO2 27 02/05/2022 1025   BUN 13 02/05/2022 1025   CREATININE 0.73 02/05/2022 1025      Component Value Date/Time   CALCIUM 8.3 (L) 02/05/2022 1025   ALKPHOS 137 (H) 02/05/2022 1025   AST 16 02/05/2022 1025   ALT 9 02/05/2022 1025   BILITOT 0.4 02/05/2022 1025      Impression and Plan: Cheryl Singh is a very charming 71 year old white female.  She has metastatic bladder cancer.  So far, she has had 4 cycles of chemotherapy.  We have had to make some dosage adjustments.  She has had blood transfusions.  She was hospitalized with a colitis.   She is now on Tecentriq.  She  tolerated the first cycle of Tecentriq quite nicely.  We will go ahead with the second cycle of Tecentriq.  She is not due for Zometa until September.  I just wanted quality life to be as good as possible.  I know that she is trying her best.  She really is quite tough.  She is on everything we have asked her to do.  I am incredibly impressed and inspired by her.  We will plan to have her come back in 3 weeks for her third cycle of treatment.  After the third cycle, we will then do a PET scan and see how everything looks.     Volanda Napoleon, MD 8/8/202311:13 AM

## 2022-02-06 ENCOUNTER — Encounter: Payer: Self-pay | Admitting: Hematology & Oncology

## 2022-02-06 LAB — T4: T4, Total: 5.8 ug/dL (ref 4.5–12.0)

## 2022-02-07 ENCOUNTER — Other Ambulatory Visit: Payer: Self-pay | Admitting: *Deleted

## 2022-02-07 MED ORDER — OXYCODONE-ACETAMINOPHEN 5-325 MG PO TABS: 1.0000 | ORAL_TABLET | ORAL | 0 refills | Status: DC | PRN

## 2022-02-19 ENCOUNTER — Other Ambulatory Visit: Payer: Self-pay

## 2022-02-19 DIAGNOSIS — C679 Malignant neoplasm of bladder, unspecified: Secondary | ICD-10-CM

## 2022-02-19 DIAGNOSIS — C7951 Secondary malignant neoplasm of bone: Secondary | ICD-10-CM

## 2022-02-19 MED ORDER — CELECOXIB 200 MG PO CAPS: 200.0000 mg | ORAL_CAPSULE | Freq: Two times a day (BID) | ORAL | 4 refills | Status: DC

## 2022-02-19 MED ORDER — OXYCODONE-ACETAMINOPHEN 5-325 MG PO TABS: 1.0000 | ORAL_TABLET | ORAL | 0 refills | Status: DC | PRN

## 2022-02-26 ENCOUNTER — Encounter: Payer: Self-pay | Admitting: Hematology & Oncology

## 2022-02-26 ENCOUNTER — Inpatient Hospital Stay: Payer: Medicare PPO

## 2022-02-26 ENCOUNTER — Inpatient Hospital Stay (HOSPITAL_BASED_OUTPATIENT_CLINIC_OR_DEPARTMENT_OTHER): Payer: Medicare PPO | Admitting: Hematology & Oncology

## 2022-02-26 ENCOUNTER — Encounter: Payer: Self-pay | Admitting: *Deleted

## 2022-02-26 VITALS — HR 98

## 2022-02-26 VITALS — BP 149/68 | HR 107 | Temp 98.0°F | Resp 18 | Wt 138.0 lb

## 2022-02-26 DIAGNOSIS — C679 Malignant neoplasm of bladder, unspecified: Secondary | ICD-10-CM | POA: Diagnosis not present

## 2022-02-26 DIAGNOSIS — D509 Iron deficiency anemia, unspecified: Secondary | ICD-10-CM | POA: Diagnosis not present

## 2022-02-26 DIAGNOSIS — C787 Secondary malignant neoplasm of liver and intrahepatic bile duct: Secondary | ICD-10-CM | POA: Diagnosis not present

## 2022-02-26 DIAGNOSIS — Z5112 Encounter for antineoplastic immunotherapy: Secondary | ICD-10-CM | POA: Diagnosis not present

## 2022-02-26 DIAGNOSIS — C7951 Secondary malignant neoplasm of bone: Secondary | ICD-10-CM | POA: Diagnosis not present

## 2022-02-26 DIAGNOSIS — Z79899 Other long term (current) drug therapy: Secondary | ICD-10-CM | POA: Diagnosis not present

## 2022-02-26 LAB — LACTATE DEHYDROGENASE: LDH: 146 U/L (ref 98–192)

## 2022-02-26 LAB — CMP (CANCER CENTER ONLY)
ALT: 8 U/L (ref 0–44)
AST: 13 U/L — ABNORMAL LOW (ref 15–41)
Albumin: 2.9 g/dL — ABNORMAL LOW (ref 3.5–5.0)
Alkaline Phosphatase: 260 U/L — ABNORMAL HIGH (ref 38–126)
Anion gap: 8 (ref 5–15)
BUN: 17 mg/dL (ref 8–23)
CO2: 28 mmol/L (ref 22–32)
Calcium: 8.6 mg/dL — ABNORMAL LOW (ref 8.9–10.3)
Chloride: 98 mmol/L (ref 98–111)
Creatinine: 1.01 mg/dL — ABNORMAL HIGH (ref 0.44–1.00)
GFR, Estimated: 60 mL/min — ABNORMAL LOW (ref >60)
Glucose, Bld: 92 mg/dL (ref 70–99)
Potassium: 4.2 mmol/L (ref 3.5–5.1)
Sodium: 134 mmol/L — ABNORMAL LOW (ref 135–145)
Total Bilirubin: 0.4 mg/dL (ref 0.3–1.2)
Total Protein: 5.3 g/dL — ABNORMAL LOW (ref 6.5–8.1)

## 2022-02-26 LAB — CBC WITH DIFFERENTIAL (CANCER CENTER ONLY)
Abs Immature Granulocytes: 0.04 10*3/uL (ref 0.00–0.07)
Basophils Absolute: 0 10*3/uL (ref 0.0–0.1)
Basophils Relative: 0 %
Eosinophils Absolute: 0.1 10*3/uL (ref 0.0–0.5)
Eosinophils Relative: 1 %
HCT: 30.2 % — ABNORMAL LOW (ref 36.0–46.0)
Hemoglobin: 10 g/dL — ABNORMAL LOW (ref 12.0–15.0)
Immature Granulocytes: 1 %
Lymphocytes Relative: 9 %
Lymphs Abs: 0.5 10*3/uL — ABNORMAL LOW (ref 0.7–4.0)
MCH: 36.1 pg — ABNORMAL HIGH (ref 26.0–34.0)
MCHC: 33.1 g/dL (ref 30.0–36.0)
MCV: 109 fL — ABNORMAL HIGH (ref 80.0–100.0)
Monocytes Absolute: 0.8 10*3/uL (ref 0.1–1.0)
Monocytes Relative: 14 %
Neutro Abs: 4.6 10*3/uL (ref 1.7–7.7)
Neutrophils Relative %: 75 %
Platelet Count: 306 10*3/uL (ref 150–400)
RBC: 2.77 MIL/uL — ABNORMAL LOW (ref 3.87–5.11)
RDW: 17 % — ABNORMAL HIGH (ref 11.5–15.5)
WBC Count: 6.1 10*3/uL (ref 4.0–10.5)
nRBC: 0 % (ref 0.0–0.2)

## 2022-02-26 LAB — IRON AND IRON BINDING CAPACITY (CC-WL,HP ONLY)
Iron: 137 ug/dL (ref 28–170)
Saturation Ratios: 85 % — ABNORMAL HIGH (ref 10.4–31.8)
TIBC: 161 ug/dL — ABNORMAL LOW (ref 250–450)
UIBC: 24 ug/dL — ABNORMAL LOW (ref 148–442)

## 2022-02-26 LAB — TSH: TSH: 2.455 u[IU]/mL (ref 0.350–4.500)

## 2022-02-26 LAB — FERRITIN: Ferritin: 992 ng/mL — ABNORMAL HIGH (ref 11–307)

## 2022-02-26 MED ORDER — HEPARIN SOD (PORK) LOCK FLUSH 100 UNIT/ML IV SOLN
500.0000 [IU] | Freq: Once | INTRAVENOUS | Status: AC | PRN
  Administered 2022-02-26: 500 [IU]

## 2022-02-26 MED ORDER — SODIUM CHLORIDE 0.9% FLUSH
10.0000 mL | INTRAVENOUS | Status: DC | PRN
  Administered 2022-02-26: 10 mL

## 2022-02-26 MED ORDER — SODIUM CHLORIDE 0.9 % IV SOLN
1200.0000 mg | Freq: Once | INTRAVENOUS | Status: AC
  Administered 2022-02-26: 1200 mg via INTRAVENOUS
  Filled 2022-02-26: qty 20

## 2022-02-26 MED ORDER — SODIUM CHLORIDE 0.9 % IV SOLN: Freq: Once | INTRAVENOUS | Status: AC

## 2022-02-26 NOTE — Progress Notes (Unsigned)
Visited with patient in infusion. She looks great. She says she feels good and doing well. No complaints at this time. She will need scans after her next cycle.   Oncology Nurse Navigator Documentation     02/26/2022   10:45 AM  Oncology Nurse Navigator Flowsheets  Navigator Follow Up Date: 03/19/2022  Navigator Follow Up Reason: Follow-up Appointment;Chemotherapy  Navigator Location [facility]  Navigator Encounter Type Treatment;Appt/Treatment Plan Review  Patient Visit Type MedOnc  Treatment Phase Active Tx  Barriers/Navigation Needs Coordination of Care;Education  Interventions Psycho-Social Support  Acuity Level 2-Minimal Needs (1-2 Barriers Identified)  Support Groups/Services Friends and Family  Time Spent with Patient 15

## 2022-02-26 NOTE — Progress Notes (Signed)
Hematology and Oncology Follow Up Visit  Cheryl Singh [id number] 1950/11/03 71 y.o. 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 Acord is back for follow-up.  This is probably best absolutely work.  She will does look good.  She is doing some more walking.  She has had no problems with increased pain.  There is no cough or shortness of breath.  She is eating a little bit better.  She has had no problems with diarrhea.  She has had no rashes.  There is been no leg swelling.  I am just happy that her quality of life seems to be improving right now.  Overall, I would have to say that her performance status is probably ECOG 1.      Medications:  Current Outpatient Medications:    atorvastatin (LIPITOR) 10 MG tablet, Take 10 mg by mouth every evening., Disp: , Rfl:    celecoxib (CELEBREX) 200 MG capsule, Take 1 capsule (200 mg total) by mouth 2 (two) times daily., Disp: 60 capsule, Rfl: 4   famotidine (PEPCID) 40 MG tablet, Take 1 tablet (40 mg total) by mouth 2 (two) times daily., Disp: 60 tablet, Rfl: 4   Multiple Vitamins-Minerals (MULTIVITAMIN WITH MINERALS) tablet, Take 1 tablet by mouth daily., Disp: , Rfl:    oxyCODONE-acetaminophen (PERCOCET/ROXICET) 5-325 MG tablet, Take 1-2 tablets by mouth every 4 (four) hours as needed for severe pain., Disp: 60 tablet, Rfl: 0   senna-docusate (SENOKOT-S) 8.6-50 MG tablet, Take 2 tablets by mouth 2 (two) times daily., Disp: 60 tablet, Rfl: 1   Simethicone (GAS RELIEF) 250 MG CAPS, Take 1 capsule by mouth daily as needed (for  flatulence)., Disp: , Rfl:    diltiazem (CARDIZEM) 30 MG tablet, TAKE 2 TABLETS (60 MG TOTAL) BY MOUTH 3 (THREE) TIMES DAILY., Disp: 540 tablet, Rfl: 3   LORazepam (ATIVAN) 0.5 MG tablet, Take 1 tablet (0.5 mg total) by mouth every 6 (six) hours as needed (Nausea or vomiting). (Patient not taking: Reported on 02/05/2022), Disp: 30 tablet, Rfl: 0   ondansetron (ZOFRAN) 8 MG tablet, Take 1 tablet (8 mg total) by mouth 2 (two) times daily as needed (Nausea or vomiting). (Patient not taking: Reported on 02/05/2022), Disp: 30 tablet, Rfl: 1   prochlorperazine (COMPAZINE) 10 MG tablet, Take 1 tablet (10 mg total) by mouth every 6 (six) hours as needed (Nausea or vomiting). (Patient not taking: Reported on 02/05/2022), Disp: 30 tablet, Rfl: 1 No current facility-administered medications for this visit.  Facility-Administered Medications Ordered in Other Visits:    sodium chloride flush (NS) 0.9 % injection 10 mL, 10 mL, Intravenous, PRN, Volanda Napoleon, MD, 10 mL at 01/08/22 2836  Allergies: No Known Allergies  Past Medical History, Surgical history, Social history, and Family History were reviewed and updated.  Review of Systems: Review of Systems  Constitutional:  Positive for fatigue.  HENT:  Negative.    Eyes: Negative.   Respiratory: Negative.    Cardiovascular:  Positive for palpitations.  Gastrointestinal:  Positive for nausea.  Endocrine: Negative.   Genitourinary: Negative.  Musculoskeletal:  Positive for back pain.  Neurological: Negative.   Hematological: Negative.   Psychiatric/Behavioral: Negative.      Physical Exam:  weight is 138 lb (62.6 kg). Her oral temperature is 98 F (36.7 C). Her blood pressure is 149/68 (abnormal) and her pulse is 107 (abnormal). Her respiration is 18 and oxygen saturation is 96%.   Wt Readings from Last 3 Encounters:  02/26/22 138 lb (62.6 kg)  02/05/22 136 lb 4 oz (61.8 kg)  01/08/22 139 lb 6 oz (63.2 kg)    Physical Exam Vitals reviewed.   HENT:     Head: Normocephalic and atraumatic.  Eyes:     Pupils: Pupils are equal, round, and reactive to light.  Cardiovascular:     Rate and Rhythm: Normal rate and regular rhythm.     Heart sounds: Normal heart sounds.  Pulmonary:     Effort: Pulmonary effort is normal.     Breath sounds: Normal breath sounds.  Abdominal:     General: Bowel sounds are normal.     Palpations: Abdomen is soft.  Musculoskeletal:        General: No tenderness or deformity. Normal range of motion.     Cervical back: Normal range of motion.  Lymphadenopathy:     Cervical: No cervical adenopathy.  Skin:    General: Skin is warm and dry.     Findings: No erythema or rash.  Neurological:     Mental Status: She is alert and oriented to person, place, and time.  Psychiatric:        Behavior: Behavior normal.        Thought Content: Thought content normal.        Judgment: Judgment normal.      Lab Results  Component Value Date   WBC 6.1 02/26/2022   HGB 10.0 (L) 02/26/2022   HCT 30.2 (L) 02/26/2022   MCV 109.0 (H) 02/26/2022   PLT 306 02/26/2022     Chemistry      Component Value Date/Time   NA 134 (L) 02/26/2022 0900   K 4.2 02/26/2022 0900   CL 98 02/26/2022 0900   CO2 28 02/26/2022 0900   BUN 17 02/26/2022 0900   CREATININE 1.01 (H) 02/26/2022 0900      Component Value Date/Time   CALCIUM 8.6 (L) 02/26/2022 0900   ALKPHOS 260 (H) 02/26/2022 0900   AST 13 (L) 02/26/2022 0900   ALT 8 02/26/2022 0900   BILITOT 0.4 02/26/2022 0900      Impression and Plan: Ms. Erkkila is a very charming 71 year old white female.  She has metastatic bladder cancer.  So far, she has had 4 cycles of chemotherapy.  We have had to make some dosage adjustments.  She has had blood transfusions.  She was hospitalized with a colitis.   She is now on Tecentriq.  So far, she is doing well with the Tecentriq.  I was happy, again, that her quality of life is where she would like it.  We will go ahead  with her third cycle of treatment.  After 4 cycles, I will then do a PET scan on her.  I think this will hopefully shows that she is responding.  Marland Kitchen     Volanda Napoleon, MD 8/29/202310:15 AM

## 2022-02-26 NOTE — Patient Instructions (Signed)

## 2022-02-26 NOTE — Patient Instructions (Signed)
[facility]  Discharge Instructions: Thank you for choosing [facility] to provide your oncology and hematology care.   If you have a lab appointment with the Cancer Center, please go directly to the Cancer Center and check in at the registration area.  Wear comfortable clothing and clothing appropriate for easy access to any Portacath or PICC line.   We strive to give you quality time with your provider. You may need to reschedule your appointment if you arrive late (15 or more minutes).  Arriving late affects you and other patients whose appointments are after yours.  Also, if you miss three or more appointments without notifying the office, you may be dismissed from the clinic at the provider's discretion.      For prescription refill requests, have your pharmacy contact our office and allow 72 hours for refills to be completed.    Today you received the following chemotherapy and/or immunotherapy agents Tecentriq      To help prevent nausea and vomiting after your treatment, we encourage you to take your nausea medication as directed.  BELOW ARE SYMPTOMS THAT SHOULD BE REPORTED IMMEDIATELY: *FEVER GREATER THAN 100.4 F (38 C) OR HIGHER *CHILLS OR SWEATING *NAUSEA AND VOMITING THAT IS NOT CONTROLLED WITH YOUR NAUSEA MEDICATION *UNUSUAL SHORTNESS OF BREATH *UNUSUAL BRUISING OR BLEEDING *URINARY PROBLEMS (pain or burning when urinating, or frequent urination) *BOWEL PROBLEMS (unusual diarrhea, constipation, pain near the anus) TENDERNESS IN MOUTH AND THROAT WITH OR WITHOUT PRESENCE OF ULCERS (sore throat, sores in mouth, or a toothache) UNUSUAL RASH, SWELLING OR PAIN  UNUSUAL VAGINAL DISCHARGE OR ITCHING   Items with * indicate a potential emergency and should be followed up as soon as possible or go to the Emergency Department if any problems should occur.  Please show the CHEMOTHERAPY ALERT CARD or IMMUNOTHERAPY ALERT CARD at check-in to the  Emergency Department and triage nurse. Should you have questions after your visit or need to cancel or reschedule your appointment, please contact [facility]  [phone] and follow the prompts.  Office hours are 8:00 a.m. to 4:30 p.m. Monday - Friday. Please note that voicemails left after 4:00 p.m. may not be returned until the following business day.  We are closed weekends and major holidays. You have access to a nurse at all times for urgent questions. Please call the main number to the clinic [phone] and follow the prompts.  For any non-urgent questions, you may also contact your provider using MyChart. We now offer e-Visits for anyone 18 and older to request care online for non-urgent symptoms. For details visit [email or web address].   Also download the MyChart app! Go to the app store, search "MyChart", open the app, select [facility], and log in with your MyChart username and password.  Masks are optional in the cancer centers. If you would like for your care team to wear a mask while they are taking care of you, please let them know. You may have one support person who is at least 70 years old accompany you for your appointments. 

## 2022-02-27 ENCOUNTER — Encounter: Payer: Self-pay | Admitting: Hematology & Oncology

## 2022-02-27 LAB — T4: T4, Total: 5.8 ug/dL (ref 4.5–12.0)

## 2022-03-01 ENCOUNTER — Other Ambulatory Visit: Payer: Self-pay | Admitting: Hematology & Oncology

## 2022-03-01 DIAGNOSIS — C679 Malignant neoplasm of bladder, unspecified: Secondary | ICD-10-CM

## 2022-03-05 ENCOUNTER — Other Ambulatory Visit: Payer: Self-pay

## 2022-03-05 DIAGNOSIS — C7951 Secondary malignant neoplasm of bone: Secondary | ICD-10-CM

## 2022-03-05 MED ORDER — OXYCODONE-ACETAMINOPHEN 5-325 MG PO TABS: 1.0000 | ORAL_TABLET | ORAL | 0 refills | Status: DC | PRN

## 2022-03-15 ENCOUNTER — Other Ambulatory Visit: Payer: Self-pay | Admitting: *Deleted

## 2022-03-15 DIAGNOSIS — C7951 Secondary malignant neoplasm of bone: Secondary | ICD-10-CM

## 2022-03-15 MED ORDER — CELECOXIB 200 MG PO CAPS: 200.0000 mg | ORAL_CAPSULE | Freq: Two times a day (BID) | ORAL | 4 refills | Status: AC

## 2022-03-15 MED ORDER — OXYCODONE-ACETAMINOPHEN 5-325 MG PO TABS: 1.0000 | ORAL_TABLET | ORAL | 0 refills | Status: DC | PRN

## 2022-03-19 ENCOUNTER — Inpatient Hospital Stay: Payer: Medicare PPO

## 2022-03-19 ENCOUNTER — Encounter: Payer: Self-pay | Admitting: Family

## 2022-03-19 ENCOUNTER — Inpatient Hospital Stay (HOSPITAL_BASED_OUTPATIENT_CLINIC_OR_DEPARTMENT_OTHER): Payer: Medicare PPO | Admitting: Family

## 2022-03-19 ENCOUNTER — Inpatient Hospital Stay: Payer: Medicare PPO | Attending: Family Medicine

## 2022-03-19 ENCOUNTER — Other Ambulatory Visit: Payer: Self-pay

## 2022-03-19 ENCOUNTER — Encounter: Payer: Self-pay | Admitting: *Deleted

## 2022-03-19 VITALS — BP 105/63 | HR 104 | Temp 97.6°F | Resp 17 | Ht 64.5 in | Wt 137.6 lb

## 2022-03-19 DIAGNOSIS — D509 Iron deficiency anemia, unspecified: Secondary | ICD-10-CM | POA: Diagnosis not present

## 2022-03-19 DIAGNOSIS — C679 Malignant neoplasm of bladder, unspecified: Secondary | ICD-10-CM

## 2022-03-19 DIAGNOSIS — Z5112 Encounter for antineoplastic immunotherapy: Secondary | ICD-10-CM | POA: Insufficient documentation

## 2022-03-19 DIAGNOSIS — C787 Secondary malignant neoplasm of liver and intrahepatic bile duct: Secondary | ICD-10-CM | POA: Insufficient documentation

## 2022-03-19 DIAGNOSIS — Z79899 Other long term (current) drug therapy: Secondary | ICD-10-CM | POA: Diagnosis not present

## 2022-03-19 DIAGNOSIS — C7951 Secondary malignant neoplasm of bone: Secondary | ICD-10-CM

## 2022-03-19 DIAGNOSIS — E032 Hypothyroidism due to medicaments and other exogenous substances: Secondary | ICD-10-CM

## 2022-03-19 LAB — CMP (CANCER CENTER ONLY)
ALT: 6 U/L (ref 0–44)
AST: 10 U/L — ABNORMAL LOW (ref 15–41)
Albumin: 2.8 g/dL — ABNORMAL LOW (ref 3.5–5.0)
Alkaline Phosphatase: 319 U/L — ABNORMAL HIGH (ref 38–126)
Anion gap: 8 (ref 5–15)
BUN: 16 mg/dL (ref 8–23)
CO2: 26 mmol/L (ref 22–32)
Calcium: 8 mg/dL — ABNORMAL LOW (ref 8.9–10.3)
Chloride: 99 mmol/L (ref 98–111)
Creatinine: 1.05 mg/dL — ABNORMAL HIGH (ref 0.44–1.00)
GFR, Estimated: 57 mL/min — ABNORMAL LOW (ref >60)
Glucose, Bld: 99 mg/dL (ref 70–99)
Potassium: 4.2 mmol/L (ref 3.5–5.1)
Sodium: 133 mmol/L — ABNORMAL LOW (ref 135–145)
Total Bilirubin: 0.4 mg/dL (ref 0.3–1.2)
Total Protein: 5.1 g/dL — ABNORMAL LOW (ref 6.5–8.1)

## 2022-03-19 LAB — CBC WITH DIFFERENTIAL (CANCER CENTER ONLY)
Abs Immature Granulocytes: 0.04 10*3/uL (ref 0.00–0.07)
Basophils Absolute: 0 10*3/uL (ref 0.0–0.1)
Basophils Relative: 0 %
Eosinophils Absolute: 0.1 10*3/uL (ref 0.0–0.5)
Eosinophils Relative: 1 %
HCT: 28.8 % — ABNORMAL LOW (ref 36.0–46.0)
Hemoglobin: 9.4 g/dL — ABNORMAL LOW (ref 12.0–15.0)
Immature Granulocytes: 1 %
Lymphocytes Relative: 5 %
Lymphs Abs: 0.4 10*3/uL — ABNORMAL LOW (ref 0.7–4.0)
MCH: 36.7 pg — ABNORMAL HIGH (ref 26.0–34.0)
MCHC: 32.6 g/dL (ref 30.0–36.0)
MCV: 112.5 fL — ABNORMAL HIGH (ref 80.0–100.0)
Monocytes Absolute: 0.9 10*3/uL (ref 0.1–1.0)
Monocytes Relative: 14 %
Neutro Abs: 5.3 10*3/uL (ref 1.7–7.7)
Neutrophils Relative %: 79 %
Platelet Count: 238 10*3/uL (ref 150–400)
RBC: 2.56 MIL/uL — ABNORMAL LOW (ref 3.87–5.11)
RDW: 14.6 % (ref 11.5–15.5)
WBC Count: 6.6 10*3/uL (ref 4.0–10.5)
nRBC: 0 % (ref 0.0–0.2)

## 2022-03-19 LAB — TSH: TSH: 2.079 u[IU]/mL (ref 0.350–4.500)

## 2022-03-19 LAB — LACTATE DEHYDROGENASE: LDH: 187 U/L (ref 98–192)

## 2022-03-19 MED ORDER — HEPARIN SOD (PORK) LOCK FLUSH 100 UNIT/ML IV SOLN: 500.0000 [IU] | Freq: Once | INTRAVENOUS | Status: DC | PRN

## 2022-03-19 MED ORDER — HEPARIN SOD (PORK) LOCK FLUSH 100 UNIT/ML IV SOLN
500.0000 [IU] | Freq: Once | INTRAVENOUS | Status: AC | PRN
  Administered 2022-03-19: 500 [IU]

## 2022-03-19 MED ORDER — SODIUM CHLORIDE 0.9% FLUSH
10.0000 mL | Freq: Once | INTRAVENOUS | Status: AC | PRN
  Administered 2022-03-19: 10 mL

## 2022-03-19 MED ORDER — SODIUM CHLORIDE 0.9% FLUSH: 10.0000 mL | INTRAVENOUS | Status: DC | PRN

## 2022-03-19 MED ORDER — SODIUM CHLORIDE 0.9 % IV SOLN
1200.0000 mg | Freq: Once | INTRAVENOUS | Status: AC
  Administered 2022-03-19: 1200 mg via INTRAVENOUS
  Filled 2022-03-19: qty 20

## 2022-03-19 MED ORDER — SODIUM CHLORIDE 0.9 % IV SOLN: Freq: Once | INTRAVENOUS | Status: AC

## 2022-03-19 MED ORDER — ZOLEDRONIC ACID 4 MG/100ML IV SOLN
4.0000 mg | Freq: Once | INTRAVENOUS | Status: AC
  Administered 2022-03-19: 4 mg via INTRAVENOUS
  Filled 2022-03-19: qty 100

## 2022-03-19 NOTE — Patient Instructions (Signed)
[facility]  Discharge Instructions: Thank you for choosing [facility] to provide your oncology and hematology care.   If you have a lab appointment with the [facility], please go directly to the [facility] and check in at the registration area.  Wear comfortable clothing and clothing appropriate for easy access to any Portacath or PICC line.   We strive to give you quality time with your provider. You may need to reschedule your appointment if you arrive late (15 or more minutes).  Arriving late affects you and other patients whose appointments are after yours.  Also, if you miss three or more appointments without notifying the office, you may be dismissed from the clinic at the provider's discretion.      For prescription refill requests, have your pharmacy contact our office and allow 72 hours for refills to be completed.    Today you received the following chemotherapy and/or immunotherapy agents Tecentriq, Zometa      To help prevent nausea and vomiting after your treatment, we encourage you to take your nausea medication as directed.  BELOW ARE SYMPTOMS THAT SHOULD BE REPORTED IMMEDIATELY: *FEVER GREATER THAN 100.4 F (38 C) OR HIGHER *CHILLS OR SWEATING *NAUSEA AND VOMITING THAT IS NOT CONTROLLED WITH YOUR NAUSEA MEDICATION *UNUSUAL SHORTNESS OF BREATH *UNUSUAL BRUISING OR BLEEDING *URINARY PROBLEMS (pain or burning when urinating, or frequent urination) *BOWEL PROBLEMS (unusual diarrhea, constipation, pain near the anus) TENDERNESS IN MOUTH AND THROAT WITH OR WITHOUT PRESENCE OF ULCERS (sore throat, sores in mouth, or a toothache) UNUSUAL RASH, SWELLING OR PAIN  UNUSUAL VAGINAL DISCHARGE OR ITCHING   Items with * indicate a potential emergency and should be followed up as soon as possible or go to the Emergency Department if any problems should occur.  Please show the CHEMOTHERAPY ALERT CARD or IMMUNOTHERAPY ALERT CARD at check-in  to the Emergency Department and triage nurse. Should you have questions after your visit or need to cancel or reschedule your appointment, please contact [facility]  [phone] and follow the prompts.  Office hours are 8:00 a.m. to 4:30 p.m. Monday - Friday. Please note that voicemails left after 4:00 p.m. may not be returned until the following business day.  We are closed weekends and major holidays. You have access to a nurse at all times for urgent questions. Please call the main number to the clinic [phone] and follow the prompts.  For any non-urgent questions, you may also contact your provider using MyChart. We now offer e-Visits for anyone 25 and older to request care online for non-urgent symptoms. For details visit [email or web address].   Also download the MyChart app! Go to the app store, search "MyChart", open the app, select [facility], and log in with your MyChart username and password.  Masks are optional in the cancer centers. If you would like for your care team to wear a mask while they are taking care of you, please let them know. You may have one support person who is at least 70 years old accompany you for your appointments.

## 2022-03-19 NOTE — Progress Notes (Signed)
Hematology and Oncology Follow Up Visit  Cheryl Singh [id number] [birth year] 71 y.o. 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 She notes persistent fatigue and occasional nausea without vomiting. She will try ativan sublingual for nausea.  No fever, chills, cough, rash, dizziness, SOB, chest pain, palpitations, abdominal pain or changes in bowel habits.  She states that sometimes she has urinary frequency.  No burning or pain on urination.  No blood loss noted. No bruising or petechiae.  No swelling, numbness or tingling in her extremities.  No falls or syncope reported.  Appetite and hydration have been ok. She will try adding a Boost or Ensure once a day. Weight is 137 lbs (previously 138 lbs).   ECOG Performance Status: 1 - Symptomatic but completely ambulatory  Medications:  Allergies as of 03/19/2022   No Known Allergies      Medication List        Accurate as of March 19, 2022 10:11 AM. If you have any questions, ask your nurse or doctor.          atorvastatin 10 MG tablet Commonly known as: LIPITOR Take 10 mg by mouth every evening.   celecoxib 200 MG capsule Commonly known as: CeleBREX Take 1 capsule (200 mg total) by mouth 2 (two) times daily.   diltiazem 30 MG tablet Commonly known as: CARDIZEM TAKE 2 TABLETS (60 MG TOTAL) BY MOUTH 3 (THREE) TIMES DAILY.   famotidine 40 MG tablet Commonly known as: PEPCID Take 1 tablet (40 mg total) by mouth 2 (two) times  daily.   Gas Relief 250 MG Caps Generic drug: Simethicone Take 1 capsule by mouth daily as needed (for flatulence).   multivitamin with minerals tablet Take 1 tablet by mouth daily.   oxyCODONE-acetaminophen 5-325 MG tablet Commonly known as: PERCOCET/ROXICET Take 1-2 tablets by mouth every 4 (four) hours as needed for severe pain.   senna-docusate 8.6-50 MG tablet Commonly known as: Senokot-S Take 2 tablets by mouth 2 (two) times daily.        Allergies: No Known Allergies  Past Medical History, Surgical history, Social history, and Family History were reviewed and updated.  Review of Systems: All other 10 point review of systems is negative.   Physical Exam:  height is 5' 4.5" (1.638 m) and weight is 137 lb 9.6 oz (62.4 kg). Her oral temperature is 97.6 F (36.4 C). Her blood pressure is 105/63 and her pulse is 104 (abnormal). Her respiration is 17 and oxygen saturation is 94%.   Wt Readings from Last 3 Encounters:  03/19/22 137 lb 9.6 oz (62.4 kg)  02/26/22 138 lb (62.6 kg)  02/05/22 136 lb 4 oz (61.8 kg)    Ocular: Sclerae unicteric, pupils equal, round and reactive to light Ear-nose-throat: Oropharynx clear, dentition fair Lymphatic: No cervical or supraclavicular adenopathy Lungs no rales or rhonchi, good excursion bilaterally Heart regular rate and rhythm, no murmur appreciated Abd soft, nontender, positive bowel sounds MSK no focal spinal  tenderness, no joint edema Neuro: non-focal, well-oriented, appropriate affect Breasts: Deferred   Lab Results  Component Value Date   WBC 6.6 03/19/2022   HGB 9.4 (L) 03/19/2022   HCT 28.8 (L) 03/19/2022   MCV 112.5 (H) 03/19/2022   PLT 238 03/19/2022   Lab Results  Component Value Date   FERRITIN 992 (H) 02/26/2022   IRON 137 02/26/2022   TIBC 161 (L) 02/26/2022   UIBC 24 (L) 02/26/2022   IRONPCTSAT 85 (H) 02/26/2022   Lab Results  Component Value Date   RETICCTPCT 2.6 02/05/2022   RBC 2.56 (L) 03/19/2022    No results found for: "KPAFRELGTCHN", "LAMBDASER", "KAPLAMBRATIO" No results found for: "IGGSERUM", "IGA", "IGMSERUM" No results found for: "TOTALPROTELP", "ALBUMINELP", "A1GS", "A2GS", "BETS", "BETA2SER", "GAMS", "MSPIKE", "SPEI"   Chemistry      Component Value Date/Time   NA 134 (L) 02/26/2022 0900   K 4.2 02/26/2022 0900   CL 98 02/26/2022 0900   CO2 28 02/26/2022 0900   BUN 17 02/26/2022 0900   CREATININE 1.01 (H) 02/26/2022 0900      Component Value Date/Time   CALCIUM 8.6 (L) 02/26/2022 0900   ALKPHOS 260 (H) 02/26/2022 0900   AST 13 (L) 02/26/2022 0900   ALT 8 02/26/2022 0900   BILITOT 0.4 02/26/2022 0900       Impression and Plan: Cheryl Singh is a very pleasant 71 yo caucasian female with metastatic bladder cancer. She had completed 4 cycles of chemo and required some dosage adjustments and blood transfusions due to colitis.  She is now on Tecentriq and seems to be tolerating nicely.  Corrected calcium for low albumin level is 9.0. She will also get Zometa today.  We will proceed with cycle 4 today as planned. We will repeat her PET scan in 2 weeks prior to her MD follow-up in 3 weeks.   Lottie Dawson, NP 9/19/202310:11 AM

## 2022-03-19 NOTE — Progress Notes (Signed)
Patient needs a PET prior to her next appointment. PET scheduled for 04/03/2022.  Patient is aware of PET appointment including date, time, and location. The following prep is reviewed with patient and confirmed with teachback: - arrive 30 minutes before appointment time - NPO except water for 6h before scan. No candy, no gum - hold any diabetic medication the morning of the scan - have a low carb dinner the night prior Radiology Information sheet also mailed to patient's home for reinforcement of education.  Oncology Nurse Navigator Documentation     03/19/2022    1:00 PM  Oncology Nurse Navigator Flowsheets  Navigator Follow Up Date: 04/03/2022  Navigator Follow Up Reason: Scan Review  Navigator Location [facility]  Navigator Encounter Type Treatment;Appt/Treatment Plan Review;Telephone  Telephone Appt Confirmation/Clarification;Education;Outgoing Call  Patient Visit Type MedOnc  Treatment Phase Active Tx  Barriers/Navigation Needs Coordination of Care;Education  Education Other  Interventions Education;Psycho-Social Support;Coordination of Care  Acuity Level 2-Minimal Needs (1-2 Barriers Identified)  Coordination of Care Radiology  Education Method Verbal;Written  Support Groups/Services Friends and Family  Time Spent with Patient 44

## 2022-03-21 ENCOUNTER — Telehealth: Payer: Self-pay | Admitting: *Deleted

## 2022-03-21 LAB — T4: T4, Total: 6.1 ug/dL (ref 4.5–12.0)

## 2022-03-21 NOTE — Telephone Encounter (Signed)
Per 03/19/22 los - Called and was unable to lvm - mailed calendar

## 2022-04-01 ENCOUNTER — Other Ambulatory Visit: Payer: Self-pay | Admitting: *Deleted

## 2022-04-01 DIAGNOSIS — C679 Malignant neoplasm of bladder, unspecified: Secondary | ICD-10-CM

## 2022-04-01 MED ORDER — LORAZEPAM 0.5 MG PO TABS: 0.5000 mg | ORAL_TABLET | Freq: Three times a day (TID) | ORAL | 3 refills | Status: AC | PRN

## 2022-04-01 MED ORDER — OXYCODONE-ACETAMINOPHEN 5-325 MG PO TABS: 1.0000 | ORAL_TABLET | ORAL | 0 refills | Status: DC | PRN

## 2022-04-03 ENCOUNTER — Ambulatory Visit (HOSPITAL_COMMUNITY)
Admission: RE | Admit: 2022-04-03 | Discharge: 2022-04-03 | Disposition: A | Discharge status: Home / Self Care | Payer: Medicare PPO | Source: Ambulatory Visit | Attending: Family | Admitting: Family

## 2022-04-03 DIAGNOSIS — C7911 Secondary malignant neoplasm of bladder: Secondary | ICD-10-CM | POA: Diagnosis not present

## 2022-04-03 DIAGNOSIS — C787 Secondary malignant neoplasm of liver and intrahepatic bile duct: Secondary | ICD-10-CM | POA: Diagnosis not present

## 2022-04-03 DIAGNOSIS — C679 Malignant neoplasm of bladder, unspecified: Secondary | ICD-10-CM | POA: Insufficient documentation

## 2022-04-03 DIAGNOSIS — C7951 Secondary malignant neoplasm of bone: Secondary | ICD-10-CM | POA: Insufficient documentation

## 2022-04-03 LAB — GLUCOSE, CAPILLARY: Glucose-Capillary: 75 mg/dL (ref 70–99)

## 2022-04-03 MED ORDER — FLUDEOXYGLUCOSE F - 18 (FDG) INJECTION
7.9000 | Freq: Once | INTRAVENOUS | Status: AC
  Administered 2022-04-03: 6.8 via INTRAVENOUS

## 2022-04-09 ENCOUNTER — Inpatient Hospital Stay: Payer: Medicare PPO

## 2022-04-09 ENCOUNTER — Other Ambulatory Visit: Payer: Self-pay | Admitting: *Deleted

## 2022-04-09 ENCOUNTER — Telehealth: Payer: Self-pay

## 2022-04-09 ENCOUNTER — Inpatient Hospital Stay (HOSPITAL_BASED_OUTPATIENT_CLINIC_OR_DEPARTMENT_OTHER): Payer: Medicare PPO | Admitting: Hematology & Oncology

## 2022-04-09 ENCOUNTER — Inpatient Hospital Stay: Payer: Medicare PPO | Attending: Family Medicine

## 2022-04-09 ENCOUNTER — Encounter: Payer: Self-pay | Admitting: *Deleted

## 2022-04-09 ENCOUNTER — Other Ambulatory Visit (HOSPITAL_COMMUNITY): Payer: Self-pay

## 2022-04-09 ENCOUNTER — Other Ambulatory Visit: Payer: Self-pay

## 2022-04-09 ENCOUNTER — Encounter: Payer: Self-pay | Admitting: Hematology & Oncology

## 2022-04-09 ENCOUNTER — Telehealth: Payer: Self-pay | Admitting: Pharmacist

## 2022-04-09 VITALS — BP 102/61 | HR 109 | Temp 98.0°F | Resp 19 | Ht 64.0 in | Wt 136.3 lb

## 2022-04-09 DIAGNOSIS — C679 Malignant neoplasm of bladder, unspecified: Secondary | ICD-10-CM

## 2022-04-09 DIAGNOSIS — C7951 Secondary malignant neoplasm of bone: Secondary | ICD-10-CM

## 2022-04-09 DIAGNOSIS — Z79899 Other long term (current) drug therapy: Secondary | ICD-10-CM | POA: Diagnosis not present

## 2022-04-09 DIAGNOSIS — E032 Hypothyroidism due to medicaments and other exogenous substances: Secondary | ICD-10-CM

## 2022-04-09 DIAGNOSIS — Z95828 Presence of other vascular implants and grafts: Secondary | ICD-10-CM

## 2022-04-09 DIAGNOSIS — Z923 Personal history of irradiation: Secondary | ICD-10-CM | POA: Diagnosis not present

## 2022-04-09 DIAGNOSIS — K529 Noninfective gastroenteritis and colitis, unspecified: Secondary | ICD-10-CM | POA: Insufficient documentation

## 2022-04-09 DIAGNOSIS — C787 Secondary malignant neoplasm of liver and intrahepatic bile duct: Secondary | ICD-10-CM | POA: Diagnosis not present

## 2022-04-09 DIAGNOSIS — D509 Iron deficiency anemia, unspecified: Secondary | ICD-10-CM | POA: Diagnosis not present

## 2022-04-09 DIAGNOSIS — N3001 Acute cystitis with hematuria: Secondary | ICD-10-CM

## 2022-04-09 DIAGNOSIS — Z9221 Personal history of antineoplastic chemotherapy: Secondary | ICD-10-CM | POA: Diagnosis not present

## 2022-04-09 DIAGNOSIS — D649 Anemia, unspecified: Secondary | ICD-10-CM

## 2022-04-09 LAB — CBC WITH DIFFERENTIAL (CANCER CENTER ONLY)
Abs Immature Granulocytes: 0.11 10*3/uL — ABNORMAL HIGH (ref 0.00–0.07)
Basophils Absolute: 0 10*3/uL (ref 0.0–0.1)
Basophils Relative: 0 %
Eosinophils Absolute: 0.1 10*3/uL (ref 0.0–0.5)
Eosinophils Relative: 2 %
HCT: 26.6 % — ABNORMAL LOW (ref 36.0–46.0)
Hemoglobin: 8.3 g/dL — ABNORMAL LOW (ref 12.0–15.0)
Immature Granulocytes: 2 %
Lymphocytes Relative: 6 %
Lymphs Abs: 0.4 10*3/uL — ABNORMAL LOW (ref 0.7–4.0)
MCH: 36.1 pg — ABNORMAL HIGH (ref 26.0–34.0)
MCHC: 31.2 g/dL (ref 30.0–36.0)
MCV: 115.7 fL — ABNORMAL HIGH (ref 80.0–100.0)
Monocytes Absolute: 0.6 10*3/uL (ref 0.1–1.0)
Monocytes Relative: 9 %
Neutro Abs: 5.6 10*3/uL (ref 1.7–7.7)
Neutrophils Relative %: 81 %
Platelet Count: 212 10*3/uL (ref 150–400)
RBC: 2.3 MIL/uL — ABNORMAL LOW (ref 3.87–5.11)
RDW: 13.3 % (ref 11.5–15.5)
WBC Count: 6.9 10*3/uL (ref 4.0–10.5)
nRBC: 0 % (ref 0.0–0.2)

## 2022-04-09 LAB — CMP (CANCER CENTER ONLY)
ALT: 11 U/L (ref 0–44)
AST: 12 U/L — ABNORMAL LOW (ref 15–41)
Albumin: 2.7 g/dL — ABNORMAL LOW (ref 3.5–5.0)
Alkaline Phosphatase: 402 U/L — ABNORMAL HIGH (ref 38–126)
Anion gap: 8 (ref 5–15)
BUN: 29 mg/dL — ABNORMAL HIGH (ref 8–23)
CO2: 27 mmol/L (ref 22–32)
Calcium: 8 mg/dL — ABNORMAL LOW (ref 8.9–10.3)
Chloride: 106 mmol/L (ref 98–111)
Creatinine: 1.32 mg/dL — ABNORMAL HIGH (ref 0.44–1.00)
GFR, Estimated: 43 mL/min — ABNORMAL LOW (ref >60)
Glucose, Bld: 95 mg/dL (ref 70–99)
Potassium: 4.7 mmol/L (ref 3.5–5.1)
Sodium: 141 mmol/L (ref 135–145)
Total Bilirubin: 0.4 mg/dL (ref 0.3–1.2)
Total Protein: 5 g/dL — ABNORMAL LOW (ref 6.5–8.1)

## 2022-04-09 LAB — URINALYSIS, COMPLETE (UACMP) WITH MICROSCOPIC
Bilirubin Urine: NEGATIVE
Glucose, UA: NEGATIVE mg/dL
Ketones, ur: 15 mg/dL — AB
Leukocytes,Ua: NEGATIVE
Nitrite: NEGATIVE
Protein, ur: >=300 mg/dL — AB
RBC / HPF: >50 RBC/hpf (ref 0–5)
Specific Gravity, Urine: 1.025 (ref 1.005–1.030)
WBC, UA: >50 WBC/hpf (ref 0–5)
pH: 6 (ref 5.0–8.0)

## 2022-04-09 LAB — LACTATE DEHYDROGENASE: LDH: 244 U/L — ABNORMAL HIGH (ref 98–192)

## 2022-04-09 LAB — PHOSPHORUS: Phosphorus: 3.5 mg/dL (ref 2.5–4.6)

## 2022-04-09 LAB — TSH: TSH: 1.892 u[IU]/mL (ref 0.350–4.500)

## 2022-04-09 LAB — PREPARE RBC (CROSSMATCH)

## 2022-04-09 MED ORDER — HEPARIN SOD (PORK) LOCK FLUSH 100 UNIT/ML IV SOLN
500.0000 [IU] | Freq: Once | INTRAVENOUS | Status: AC
  Administered 2022-04-09: 500 [IU] via INTRAVENOUS

## 2022-04-09 MED ORDER — ERDAFITINIB 4 MG PO TABS
8.0000 mg | ORAL_TABLET | Freq: Every day | ORAL | 4 refills | Status: DC
  Filled 2022-04-09: qty 60, fill #0
  Filled 2022-04-10: qty 56, 28d supply, fill #0
  Filled 2022-05-09: qty 56, 28d supply, fill #1

## 2022-04-09 MED ORDER — SODIUM CHLORIDE 0.9% FLUSH
10.0000 mL | Freq: Once | INTRAVENOUS | Status: AC
  Administered 2022-04-09: 10 mL via INTRAVENOUS

## 2022-04-09 NOTE — Progress Notes (Signed)
PET scan shows progressive disease. Treatment will be held today; patient will come tomorrow for blood transfusion; and new treatment plan will be ordered. Prescription for [facility] sent to [place]. Patient will need monthly eye exams for four months. These will be coordinated while patient is in the office tomorrow.   Oncology Nurse Navigator Documentation     04/09/2022   10:45 AM  Oncology Nurse Navigator Flowsheets  Navigator Follow Up Date: 04/10/2022  Navigator Follow Up Reason: Symptom Management  Navigator Location [facility]  Navigator Encounter Type Treatment;Appt/Treatment Plan Review;Scan Review  Patient Visit Type MedOnc  Treatment Phase Active Tx  Barriers/Navigation Needs Coordination of Care;Education  Interventions Psycho-Social Support  Acuity Level 2-Minimal Needs (1-2 Barriers Identified)  Support Groups/Services Friends and Family  Time Spent with Patient 15

## 2022-04-09 NOTE — Telephone Encounter (Signed)
Oral Oncology Patient Advocate Encounter   Received notification that prior authorization for Cheryl Singh is required.   PA submitted on 10.10.23  Key [id number]  Status is pending     Berdine Addison, [facility] [place]  [phone] (phone) [phone] (fax) 04/09/2022 1:05 PM

## 2022-04-09 NOTE — Addendum Note (Signed)
Addended by: Amelia Jo I on: 04/09/2022 12:21 PM   Modules accepted: Orders

## 2022-04-09 NOTE — Telephone Encounter (Signed)
Oral Oncology Patient Advocate Encounter   Began application for assistance for Balversa through [employer].   Application will be submitted upon completion of necessary supporting documentation.   [employer]'s phone number [phone].   I will continue to check the status until final determination.   Berdine Addison, [facility] Patient [place]  [phone] (phone) [phone] (fax) 04/09/2022 4:27 PM

## 2022-04-09 NOTE — Patient Instructions (Signed)

## 2022-04-09 NOTE — Telephone Encounter (Addendum)
Oral Oncology Pharmacist Encounter  Received new prescription for Balversa (erdafitinib) for the treatment of metastatic urothelial carcinoma, FGFR3 mutated, planned duration until disease progression or unacceptable drug toxicity.  CBC w/ Diff and CMP from 04/09/22 assessed: Patient with Scr of 1.32 mg/dL (CrCl ~45.5 mL/min). No baseline renal or hepatic dose adjustments required.  Phosphate from 04/09/22 WNL (3.5 mg/dL) - Balversa can cause hyperphosphatemia, and patient should be closely monitored for hyperphosphatemia at least monthly while on therapy. Prescription dose and frequency assessed for appropriateness. Per package insert, if patient has stable phosphate levels and is tolerating medication at day 14-21 of first cycle, patient may be increased up to 9 mg daily dosing at MD discretion.   Due to risk of ocular disorders (central serous retinopathy/retinal pigment epithelial detachment) - patient will need to have monthly ophthalmological exams monthly x first 4 months of therapy, followed by every 3 months thereafter. Staff message sent to Dr. Marin Olp regarding this.   Current medication list in Epic reviewed, no relevant/significant DDIs with Balversa identified.  Evaluated chart and no patient barriers to medication adherence noted.   Prescription has been e-scribed to the [facility] for benefits analysis and approval.  Oral Oncology Clinic will continue to follow for insurance authorization, copayment issues, initial counseling and start date.  Leron Croak, PharmD, BCPS, BCOP Hematology/Oncology Clinical Pharmacist [facility] [phone] 04/09/2022 1:05 PM

## 2022-04-09 NOTE — Progress Notes (Signed)
Hematology and Oncology Follow Up Visit  Cheryl Singh [id number] 1951-03-05 71 y.o. 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  Unfortunately, we have progressive disease.  This was found on her PET scan that she had done last week.  The PET scan showed disease progression in her liver, bones and lymph nodes.  I am a little surprised by this.  At this point, I think that we will now try the targeted agent that targets the FGFR3 gene.  The targeted agent that is approved is Erdatifinib.  Think this is a very reasonable choice.  I know we have not yet used all Padcev.  We have not yet used MVAC.  I am not sure how well she would tolerate these.  She still has a decent performance status.  She had a phosphorus level checked today.  It was 3.5.  She has hemoglobin 8.3.  I think we will going to have to transfuse her.  I think this would help make her feel better.  She says she is eating okay.  She has had no nausea or vomiting.  She has had no diarrhea.  She has had no bleeding.  She has had issues in the past with colitis.  She been hospitalized for this.  She has had no fever.  She has had no leg swelling.  She says her pain is under very good control right now.  I am happy about this for her.  Overall, I think her performance status is probably ECOG 2.    Medications:  Allergies as of 04/09/2022   No Known Allergies      Medication List         Accurate as of April 09, 2022 12:00 PM. If you have any questions, ask your nurse or doctor.          atorvastatin 10 MG tablet Commonly known as: LIPITOR Take 10 mg by mouth every evening.   celecoxib 200 MG capsule Commonly known as: CeleBREX Take 1 capsule (200 mg total) by mouth 2 (two) times daily.   diltiazem 30 MG tablet Commonly known as: CARDIZEM TAKE 2 TABLETS (60 MG TOTAL) BY MOUTH 3 (THREE) TIMES DAILY.   famotidine 40 MG tablet Commonly known as: PEPCID Take 1 tablet (40 mg total) by mouth 2 (two) times daily.   Gas Relief 250 MG Caps Generic drug: Simethicone Take 1 capsule by mouth daily as needed (for flatulence).   LORazepam 0.5 MG tablet Commonly known as: ATIVAN Take 1 tablet (0.5 mg total) by mouth every 8 (eight) hours as needed for anxiety.   multivitamin with minerals tablet Take 1 tablet by mouth daily.   oxyCODONE-acetaminophen 5-325 MG tablet Commonly known as: PERCOCET/ROXICET Take 1-2 tablets by mouth every 4 (four) hours as needed for severe pain.   senna-docusate 8.6-50 MG tablet Commonly known as: Senokot-S Take 2 tablets by mouth 2 (two) times daily.  Allergies: No Known Allergies  Past Medical History, Surgical history, Social history, and Family History were reviewed and updated.  Review of Systems: Review of Systems  Constitutional:  Positive for malaise/fatigue. Negative for fever.  HENT: Negative.    Eyes: Negative.   Respiratory: Negative.    Cardiovascular: Negative.   Gastrointestinal: Negative.   Genitourinary: Negative.   Musculoskeletal: Negative.   Skin: Negative.   Neurological: Negative.   Endo/Heme/Allergies: Negative.   Psychiatric/Behavioral: Negative.       Physical Exam:  height is 5' 4"  (1.626 m) and weight is 136 lb 4.8 oz (61.8 kg). Her oral temperature is 98 F (36.7 C). Her blood pressure is 102/61 and her pulse is 109 (abnormal). Her respiration is 19 and oxygen saturation is  98%.   Wt Readings from Last 3 Encounters:  04/09/22 136 lb 4.8 oz (61.8 kg)  03/19/22 137 lb 9.6 oz (62.4 kg)  02/26/22 138 lb (62.6 kg)    Physical Exam Vitals reviewed.  HENT:     Head: Normocephalic and atraumatic.  Eyes:     Pupils: Pupils are equal, round, and reactive to light.  Cardiovascular:     Rate and Rhythm: Normal rate and regular rhythm.     Heart sounds: Normal heart sounds.  Pulmonary:     Effort: Pulmonary effort is normal.     Breath sounds: Normal breath sounds.  Abdominal:     General: Bowel sounds are normal.     Palpations: Abdomen is soft.  Musculoskeletal:        General: No tenderness or deformity. Normal range of motion.     Cervical back: Normal range of motion.  Lymphadenopathy:     Cervical: No cervical adenopathy.  Skin:    General: Skin is warm and dry.     Findings: No erythema or rash.  Neurological:     Mental Status: She is alert and oriented to person, place, and time.  Psychiatric:        Behavior: Behavior normal.        Thought Content: Thought content normal.        Judgment: Judgment normal.      Lab Results  Component Value Date   WBC 6.9 04/09/2022   HGB 8.3 (L) 04/09/2022   HCT 26.6 (L) 04/09/2022   MCV 115.7 (H) 04/09/2022   PLT 212 04/09/2022   Lab Results  Component Value Date   FERRITIN 992 (H) 02/26/2022   IRON 137 02/26/2022   TIBC 161 (L) 02/26/2022   UIBC 24 (L) 02/26/2022   IRONPCTSAT 85 (H) 02/26/2022   Lab Results  Component Value Date   RETICCTPCT 2.6 02/05/2022   RBC 2.30 (L) 04/09/2022   No results found for: "KPAFRELGTCHN", "LAMBDASER", "KAPLAMBRATIO" No results found for: "IGGSERUM", "IGA", "IGMSERUM" No results found for: "TOTALPROTELP", "ALBUMINELP", "A1GS", "A2GS", "BETS", "BETA2SER", "GAMS", "MSPIKE", "SPEI"   Chemistry      Component Value Date/Time   NA 141 04/09/2022 1021   K 4.7 04/09/2022 1021   CL 106 04/09/2022 1021   CO2 27 04/09/2022 1021   BUN 29 (H) 04/09/2022 1021    CREATININE 1.32 (H) 04/09/2022 1021      Component Value Date/Time   CALCIUM 8.0 (L) 04/09/2022 1021   ALKPHOS 402 (H) 04/09/2022 1021   AST 12 (L) 04/09/2022 1021   ALT 11 04/09/2022 1021   BILITOT 0.4 04/09/2022 1021       Impression and Plan: Ms. Kath is a very pleasant 71 yo caucasian  female with metastatic bladder cancer. She had completed 4 cycles of chemo and required some dosage adjustments and blood transfusions due to colitis.   She has been on Tecentriq.  Unfortunately, this did not have much of a response.  I think she responded initially and then has had progressive disease.  We will try her on Erdatifinib [facility]) and to see if this can help her out.  Again this is targeted against the FGFR3 gene.  Hopefully, he can block this so that she will have response with her tumor.  We will try to get this started in a week.  She will have a blood transfusion.  I think this will help make her feel little bit better.  We would like to try to get her back to see [place] in another 3 weeks.  We will have to watch her phosphate level.  I think the dosage of Erdatifinib is based upon the phosphorus levels.  Things also will need to have eye exams.  Hopefully, we can find that she has a optometrist/ophthalmologist.     Volanda Napoleon, MD 10/10/202312:00 PM

## 2022-04-09 NOTE — Telephone Encounter (Signed)
Oral Oncology Patient Advocate Encounter  Prior Authorization for Cheryl Singh has been approved.    PA# [id number]  Effective dates: 10.10.23 through 04.07.24  Patients co-pay is $100.    Berdine Addison, [facility] Patient [place]  [phone] (phone) [phone] (fax) 04/09/2022 1:29 PM

## 2022-04-09 NOTE — Addendum Note (Signed)
Addended by: Tyler Aas A on: 04/09/2022 12:11 PM   Modules accepted: Orders

## 2022-04-10 ENCOUNTER — Telehealth: Payer: Self-pay

## 2022-04-10 ENCOUNTER — Encounter: Payer: Self-pay | Admitting: *Deleted

## 2022-04-10 ENCOUNTER — Other Ambulatory Visit (HOSPITAL_COMMUNITY): Payer: Self-pay

## 2022-04-10 ENCOUNTER — Inpatient Hospital Stay: Payer: Medicare PPO

## 2022-04-10 DIAGNOSIS — C787 Secondary malignant neoplasm of liver and intrahepatic bile duct: Secondary | ICD-10-CM | POA: Diagnosis not present

## 2022-04-10 DIAGNOSIS — C7951 Secondary malignant neoplasm of bone: Secondary | ICD-10-CM | POA: Diagnosis not present

## 2022-04-10 DIAGNOSIS — Z923 Personal history of irradiation: Secondary | ICD-10-CM | POA: Diagnosis not present

## 2022-04-10 DIAGNOSIS — D649 Anemia, unspecified: Secondary | ICD-10-CM

## 2022-04-10 DIAGNOSIS — K529 Noninfective gastroenteritis and colitis, unspecified: Secondary | ICD-10-CM | POA: Diagnosis not present

## 2022-04-10 DIAGNOSIS — N390 Urinary tract infection, site not specified: Secondary | ICD-10-CM

## 2022-04-10 DIAGNOSIS — D509 Iron deficiency anemia, unspecified: Secondary | ICD-10-CM | POA: Diagnosis not present

## 2022-04-10 DIAGNOSIS — Z9221 Personal history of antineoplastic chemotherapy: Secondary | ICD-10-CM | POA: Diagnosis not present

## 2022-04-10 DIAGNOSIS — C679 Malignant neoplasm of bladder, unspecified: Secondary | ICD-10-CM | POA: Diagnosis not present

## 2022-04-10 DIAGNOSIS — Z79899 Other long term (current) drug therapy: Secondary | ICD-10-CM | POA: Diagnosis not present

## 2022-04-10 LAB — T4: T4, Total: 3.9 ug/dL — ABNORMAL LOW (ref 4.5–12.0)

## 2022-04-10 MED ORDER — SODIUM CHLORIDE 0.9% FLUSH
10.0000 mL | INTRAVENOUS | Status: AC | PRN
  Administered 2022-04-10: 10 mL

## 2022-04-10 MED ORDER — FUROSEMIDE 10 MG/ML IJ SOLN
20.0000 mg | Freq: Once | INTRAMUSCULAR | Status: AC
  Administered 2022-04-10: 20 mg via INTRAVENOUS

## 2022-04-10 MED ORDER — DIPHENHYDRAMINE HCL 25 MG PO CAPS
25.0000 mg | ORAL_CAPSULE | Freq: Once | ORAL | Status: AC
  Administered 2022-04-10: 25 mg via ORAL
  Filled 2022-04-10: qty 1

## 2022-04-10 MED ORDER — HEPARIN SOD (PORK) LOCK FLUSH 100 UNIT/ML IV SOLN
250.0000 [IU] | INTRAVENOUS | Status: AC | PRN
  Administered 2022-04-10: 250 [IU]

## 2022-04-10 MED ORDER — CIPROFLOXACIN HCL 500 MG PO TABS: 500.0000 mg | ORAL_TABLET | Freq: Two times a day (BID) | ORAL | 0 refills | Status: AC

## 2022-04-10 MED ORDER — FUROSEMIDE 10 MG/ML IJ SOLN: 20.0000 mg | Freq: Once | INTRAMUSCULAR | Status: DC

## 2022-04-10 MED ORDER — ACETAMINOPHEN 325 MG PO TABS
650.0000 mg | ORAL_TABLET | Freq: Once | ORAL | Status: AC
  Administered 2022-04-10: 650 mg via ORAL
  Filled 2022-04-10: qty 2

## 2022-04-10 MED ORDER — SODIUM CHLORIDE 0.9% IV SOLUTION
250.0000 mL | Freq: Once | INTRAVENOUS | Status: AC
  Administered 2022-04-10: 250 mL via INTRAVENOUS

## 2022-04-10 NOTE — Telephone Encounter (Signed)
Oral Oncology Patient Advocate Encounter   Submitted application for assistance for Balversa to [employer].   Application submitted via e-fax to [phone]   [employer]'s phone number [phone].   I will continue to check the status until final determination.   Berdine Addison, [facility] Patient [place]  [phone] (phone) [phone] (fax) 04/10/2022 2:30 PM

## 2022-04-10 NOTE — Progress Notes (Signed)
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 Is there a way this can be coordinated for the patient?   Spoke to patient in infusion. She currently has no designated optometrist as she just sees whoever is at [facility] [employer]. I called [facility] who has helped [place] previously but they are unable to get patient in until January.   Called and left message with Dr Teola Bradley OD [phone]). Awaiting call back from that office to see if they can help up with monthly exams.   Patient is aware that I will not likely have appointments made for her today, but I will reach out via phone once an office is found.   Oncology Nurse Navigator Documentation     04/10/2022   10:00 AM  Oncology Nurse Navigator Flowsheets  Navigator Follow Up Date: 04/11/2022  Navigator Follow Up Reason: Other:  Navigator Location [facility]  Navigator Encounter Type Treatment  Patient Visit Type MedOnc  Treatment Phase Active Tx  Barriers/Navigation Needs Coordination of Care;Education  Education Other  Interventions Coordination of Care;Education;Psycho-Social Support  Acuity Level 2-Minimal Needs (1-2 Barriers Identified)  Coordination of Care Other  Education Method Verbal  Support Groups/Services Friends and Family  Time Spent with Patient 69

## 2022-04-10 NOTE — Patient Instructions (Signed)

## 2022-04-10 NOTE — Telephone Encounter (Signed)
Called and informed patient of lab results, patient verbalized understanding and denies any questions or concerns at this time. Prescription sent to [facility].

## 2022-04-10 NOTE — Telephone Encounter (Signed)
-----   Message from Volanda Napoleon, MD sent at 04/10/2022  3:48 PM EDT ----- Call and let her know that she has E. coli in her urine.  Please call and ciprofloxacin 500 mg p.o. twice daily x5 days.  Thanks much.  Laurey Arrow

## 2022-04-10 NOTE — Telephone Encounter (Signed)
Oral Oncology Patient Advocate Encounter  Reached out and spoke with patient regarding PAP paperwork, explained that I would send it to their preferred email via Docu[employer].   Confirmed email address: [email or web address].    Patient expressed understanding and consent.  Will follow up once paperwork has been signed and returned.   Berdine Addison, [facility] Patient [place]  [phone] (phone) [phone] (fax) 04/10/2022 9:36 AM

## 2022-04-10 NOTE — Telephone Encounter (Signed)
Oral Oncology Patient Advocate Encounter   Received notification that the application for assistance for Balversa through [employer] has been denied due to having insurance.   [employer]'s phone number [phone].  I will follow up with [employer] to see about an appeal.   Berdine Addison, [place] Patient [place]  [phone] (phone) [phone] (fax) 04/10/2022 4:19 PM

## 2022-04-11 ENCOUNTER — Other Ambulatory Visit (HOSPITAL_COMMUNITY): Payer: Self-pay

## 2022-04-11 ENCOUNTER — Telehealth: Payer: Self-pay

## 2022-04-11 ENCOUNTER — Encounter: Payer: Self-pay | Admitting: *Deleted

## 2022-04-11 DIAGNOSIS — B9620 Unspecified Escherichia coli [E. coli] as the cause of diseases classified elsewhere: Secondary | ICD-10-CM

## 2022-04-11 LAB — TYPE AND SCREEN
ABO/RH(D): AB POS
Antibody Screen: NEGATIVE
Unit division: 0
Unit division: 0

## 2022-04-11 LAB — BPAM RBC
Blood Product Expiration Date: 202311032359
Blood Product Expiration Date: 202311032359
ISSUE DATE / TIME: 202310110835
ISSUE DATE / TIME: 202310110835
Unit Type and Rh: 6200
Unit Type and Rh: 6200

## 2022-04-11 LAB — URINE CULTURE: Culture: 50000 — AB

## 2022-04-11 MED ORDER — NITROFURANTOIN MONOHYD MACRO 100 MG PO CAPS: 100.0000 mg | ORAL_CAPSULE | Freq: Two times a day (BID) | ORAL | 0 refills | Status: AC

## 2022-04-11 NOTE — Progress Notes (Signed)
Still haven't successfully found an optometrist for patient's routine surveillance for possible chemo side effects.   Reached out to:  [employer] [phone] Spoke to [place] and she took all relevant information and will speak to her providers and return my call.   Oncology Nurse Navigator Documentation     04/11/2022    2:30 PM  Oncology Nurse Navigator Flowsheets  Navigator Follow Up Date: 04/12/2022  Navigator Follow Up Reason: Other:  Navigator Location [facility]  Navigator Encounter Type Appt/Treatment Plan Review  Patient Visit Type MedOnc  Treatment Phase Active Tx  Barriers/Navigation Needs Coordination of Care;Education  Interventions Coordination of Care  Acuity Level 2-Minimal Needs (1-2 Barriers Identified)  Coordination of Care Other  Support Groups/Services Friends and Family  Time Spent with Patient 30

## 2022-04-11 NOTE — Telephone Encounter (Addendum)
Oral Chemotherapy Pharmacist Encounter  Balversa start date: 04/19/22 (patient will start after finishing course of abx for UTI).  Patient Education I spoke with patient for overview of new oral chemotherapy medication: Balversa (erdafitinib) for the treatment of metastatic urothelial carcinoma, FGFR3 mutated, planned duration until disease progression or unacceptable drug toxicity.  Pt is doing well. Counseled patient on administration, dosing, side effects, monitoring, drug-food interactions, safe handling, storage, and disposal.  Patient will take Balversa 4 mg tablets, 2 tablets (8 mg total) by mouth daily. Patient may take with or without food. Patient knows to avoid grapefruit and grapefruit juice. Discussed with patient that it is recommended to avoid phosphate intake to 600-800 mg/d the first 3 weeks of treatment.  Side effects include but not limited to: rash, nail changes, hyperphosphatemia, diarrhea, stomatitis, dysguesia, fatigue, elevated LFTs, decreased blood counts, changes in electrolytes.  Rash discussed use of cream such as [place] Smooth Extra Care 20 or equivalent advanced care cream that has 20% urea content for advanced skin hydration while on Balversa Diarrhea: Patient will obtain Imodium (loperamide) to have on hand if they experience diarrhea. Patient knows to alert the office of 4 or more loose stools above baseline. Dry Eyes: Patient informed to use artificial tears at least every 2 hours while awake to prevent dry eyes while on Balversa  Rare but serious side effect of occular disorders discussed. Patient aware she will need to have monthly eye exams the first 4 months of treatment and then every 3 months thereafter while on therapy. Patient knows to alert the office if she has any noted changes in vision while being on this medication.   Reviewed with patient importance of keeping a medication schedule and plan for any missed doses.  After discussion with patient no  patient barriers to medication adherence identified.   Ms. Gugel voiced understanding and appreciation. All questions answered. Medication handout provided.  Provided patient with [facility] phone number. Patient knows to call the office with questions or concerns. Oral Chemotherapy [facility] will continue to follow.  Leron Croak, PharmD, BCPS, BCOP Hematology/Oncology Clinical Pharmacist [facility] and [place] [phone] 04/11/2022 11:00 AM

## 2022-04-11 NOTE — Telephone Encounter (Signed)
Called and informed patient of lab results and advised her to stop the Cipro and start Macrobid. Patient and [name] (on DPR) verbalized understanding and denies any questions or concerns at this time. [place] sent.

## 2022-04-16 ENCOUNTER — Encounter: Payer: Self-pay | Admitting: *Deleted

## 2022-04-16 ENCOUNTER — Other Ambulatory Visit: Payer: Self-pay

## 2022-04-16 ENCOUNTER — Other Ambulatory Visit (HOSPITAL_COMMUNITY): Payer: Self-pay

## 2022-04-16 DIAGNOSIS — C7951 Secondary malignant neoplasm of bone: Secondary | ICD-10-CM

## 2022-04-16 DIAGNOSIS — T50905A Adverse effect of unspecified drugs, medicaments and biological substances, initial encounter: Secondary | ICD-10-CM

## 2022-04-16 MED ORDER — OXYCODONE-ACETAMINOPHEN 5-325 MG PO TABS: 1.0000 | ORAL_TABLET | ORAL | 0 refills | Status: DC | PRN

## 2022-04-16 NOTE — Telephone Encounter (Signed)
Oral Oncology Patient Advocate Encounter   Submitted application for assistance for [facility] to [facility] Patient Assistance.   Application submitted via e-fax to [phone]   [employer]'s phone number [phone].   I will continue to check the status until final determination.   Berdine Addison, [facility] Patient [place]  [phone] (phone) [phone] (fax) 04/16/2022 11:12 AM

## 2022-04-16 NOTE — Telephone Encounter (Signed)
Oral Oncology Patient Advocate Encounter   Began application for assistance for Balversa through [place] Patient Assistance.   Application will be submitted upon completion of necessary supporting documentation.   [facility]'s phone number [phone].   I will continue to check the status until final determination.   Berdine Addison, [facility] Patient [place]  [phone] (phone) [phone] (fax) 04/16/2022 10:34 AM

## 2022-04-16 NOTE — Progress Notes (Signed)
Still haven't made progress with finding an eye office to help co-manage this patient. Made several calls today eventually making contact with   [facility] Specialist [address] [place] [address] [phone]  Patient is scheduled with Dr Rutherford Nail on November 9th, 2023 at 8:45am.   Called and spoke to patient. Informed her of appointment including date, time and location. She also asked that I send the info via [employer].  Oncology Nurse Navigator Documentation     04/16/2022   12:30 PM  Oncology Nurse Navigator Flowsheets  Navigator Follow Up Date: 05/03/2022  Navigator Follow Up Reason: Follow-up Appointment  Navigator Location [facility]  Navigator Encounter Type Appt/Treatment Plan Review;Telephone  Telephone Appt Confirmation/Clarification;Education;Outgoing Call  Patient Visit Type MedOnc  Treatment Phase Active Tx  Barriers/Navigation Needs Coordination of Care;Education  Education Other  Interventions Coordination of Care;Education;Referrals  Acuity Level 2-Minimal Needs (1-2 Barriers Identified)  Referrals Other  Coordination of Care Other  Education Method Verbal  Support Groups/Services Friends and Family  Time Spent with Patient 33

## 2022-04-22 ENCOUNTER — Other Ambulatory Visit (HOSPITAL_COMMUNITY): Payer: Self-pay

## 2022-04-24 ENCOUNTER — Other Ambulatory Visit: Payer: Self-pay

## 2022-04-24 DIAGNOSIS — C679 Malignant neoplasm of bladder, unspecified: Secondary | ICD-10-CM

## 2022-04-24 MED ORDER — OXYCODONE-ACETAMINOPHEN 5-325 MG PO TABS: 1.0000 | ORAL_TABLET | ORAL | 0 refills | Status: DC | PRN

## 2022-04-24 NOTE — Telephone Encounter (Signed)
Oral Oncology Patient Advocate Encounter   Received notification that the application for assistance for Balversa through [employer] has been denied due to patient not spending 4% of yearly income on Balversa since copay is $100.   [place]'s phone number [phone].   I have spoken to the patient.   Berdine Addison, [facility] Patient [place]  [phone] (phone) [phone] (fax) 04/24/2022 8:56 AM

## 2022-04-30 ENCOUNTER — Other Ambulatory Visit (HOSPITAL_COMMUNITY): Payer: Self-pay

## 2022-05-02 ENCOUNTER — Other Ambulatory Visit: Payer: Self-pay | Admitting: *Deleted

## 2022-05-02 DIAGNOSIS — C679 Malignant neoplasm of bladder, unspecified: Secondary | ICD-10-CM

## 2022-05-02 MED ORDER — OXYCODONE-ACETAMINOPHEN 5-325 MG PO TABS: 1.0000 | ORAL_TABLET | ORAL | 0 refills | Status: AC | PRN

## 2022-05-03 ENCOUNTER — Inpatient Hospital Stay: Payer: Medicare PPO | Admitting: Family

## 2022-05-03 ENCOUNTER — Encounter: Payer: Self-pay | Admitting: *Deleted

## 2022-05-03 ENCOUNTER — Inpatient Hospital Stay: Payer: Medicare PPO | Attending: Family Medicine

## 2022-05-03 ENCOUNTER — Inpatient Hospital Stay: Payer: Medicare PPO

## 2022-05-03 DIAGNOSIS — Z66 Do not resuscitate: Secondary | ICD-10-CM | POA: Insufficient documentation

## 2022-05-03 DIAGNOSIS — C787 Secondary malignant neoplasm of liver and intrahepatic bile duct: Secondary | ICD-10-CM | POA: Insufficient documentation

## 2022-05-03 DIAGNOSIS — C679 Malignant neoplasm of bladder, unspecified: Secondary | ICD-10-CM | POA: Insufficient documentation

## 2022-05-03 DIAGNOSIS — C7951 Secondary malignant neoplasm of bone: Secondary | ICD-10-CM | POA: Insufficient documentation

## 2022-05-03 DIAGNOSIS — E860 Dehydration: Secondary | ICD-10-CM | POA: Insufficient documentation

## 2022-05-03 DIAGNOSIS — D509 Iron deficiency anemia, unspecified: Secondary | ICD-10-CM | POA: Insufficient documentation

## 2022-05-03 NOTE — Progress Notes (Unsigned)
Patient was a no-show to today's appointment. Called patient and she didn't come because she's had a few days of diarrhea with abdominal cramping. She hasn't taken anything for either. She asks if Dr Marin Olp can prescribe something to help.  Patient also mentions that she's out of town next week, so she will need to reschedule on the week of November 13th. Message sent to scheduling.   Spoke to Dr Marin Olp. He would like patient to take imodium. Patient is aware of this recommendation.   Oncology Nurse Navigator Documentation     05/03/2022    1:30 PM  Oncology Nurse Navigator Flowsheets  Navigator Follow Up Date: 05/21/2022  Navigator Follow Up Reason: Follow-up Appointment;Chemotherapy  Production assistant, radio Encounter Type Telephone  Telephone Appt Confirmation/Clarification;Outgoing Call;Patient Update  Patient Visit Type MedOnc  Treatment Phase Active Tx  Barriers/Navigation Needs Coordination of Care;Education  Education Pain/ Symptom Management  Interventions Coordination of Care;Education;Psycho-Social Support  Acuity Level 2-Minimal Needs (1-2 Barriers Identified)  Coordination of Care Appts  Education Method Verbal  Support Groups/Services Friends and Family  Time Spent with Patient 30

## 2022-05-07 ENCOUNTER — Encounter: Payer: Self-pay | Admitting: Hematology & Oncology

## 2022-05-07 ENCOUNTER — Other Ambulatory Visit (HOSPITAL_COMMUNITY): Payer: Self-pay

## 2022-05-07 NOTE — Telephone Encounter (Signed)
Called patient but was unable to lvm - mailbox is full - mailed calendar of new appointment

## 2022-05-09 ENCOUNTER — Other Ambulatory Visit (HOSPITAL_COMMUNITY): Payer: Self-pay

## 2022-05-10 ENCOUNTER — Other Ambulatory Visit (HOSPITAL_COMMUNITY): Payer: Self-pay

## 2022-05-13 ENCOUNTER — Telehealth: Payer: Self-pay

## 2022-05-13 NOTE — Telephone Encounter (Signed)
Received call from pt's caregiver, [name] with concerns related to pt's "declining health."  Per [name], pt's appetite has slowly decreased until she is hardly eating at all. [name] reports pt may have a quarter of a sandwich and an Ensure all day long. Reports pt to have gotten increasingly weaker to the point that she will not shower or wash her hair. [name] has tried to encourage sponge baths but pt is beginning to refuse these. Pt is stating that she is too weak to go to eye appt (related to oral chemo tx) this week and cannot make it into our office for labwork or office visit. [name] reports pt sleeps most of the day and gets up to the bathroom with assist x 1. [name] states pt's diarrhea has resolved and she denies pain, nausea, vomiting, SOB. [name] questions if someone can come to the house to assist with ADLs and/or draw labs.   Spoke with Marzetta Board in Palliative at [facility] who reports ADLs are down with Hospice patients but not palliative & lab draws are rare and on a "case by case basis."   Per Dr Marin Olp, pt to hold chemo x 1 week to see if symptoms improve and because she is not planning to see eye doctor. Dr Marin Olp would consider a home health referral but would need to see patient in the office first.   [name] verbalized understanding of holding treatment x 1 week then calling our office with an update Monday or Tuesday next week using teachback. Aware that if pt continues to decline she will need to bring pt to the ED by EMS.

## 2022-05-14 ENCOUNTER — Other Ambulatory Visit (HOSPITAL_COMMUNITY): Payer: Self-pay

## 2022-05-19 IMAGING — DX DG CHEST 1V
1 series · 1 of 1 positions shown · non-contrast
Comparison: 09/15/2021, CT 09/15/2021

CLINICAL DATA: Weakness

EXAM:
CHEST  1 VIEW

[chest ap]
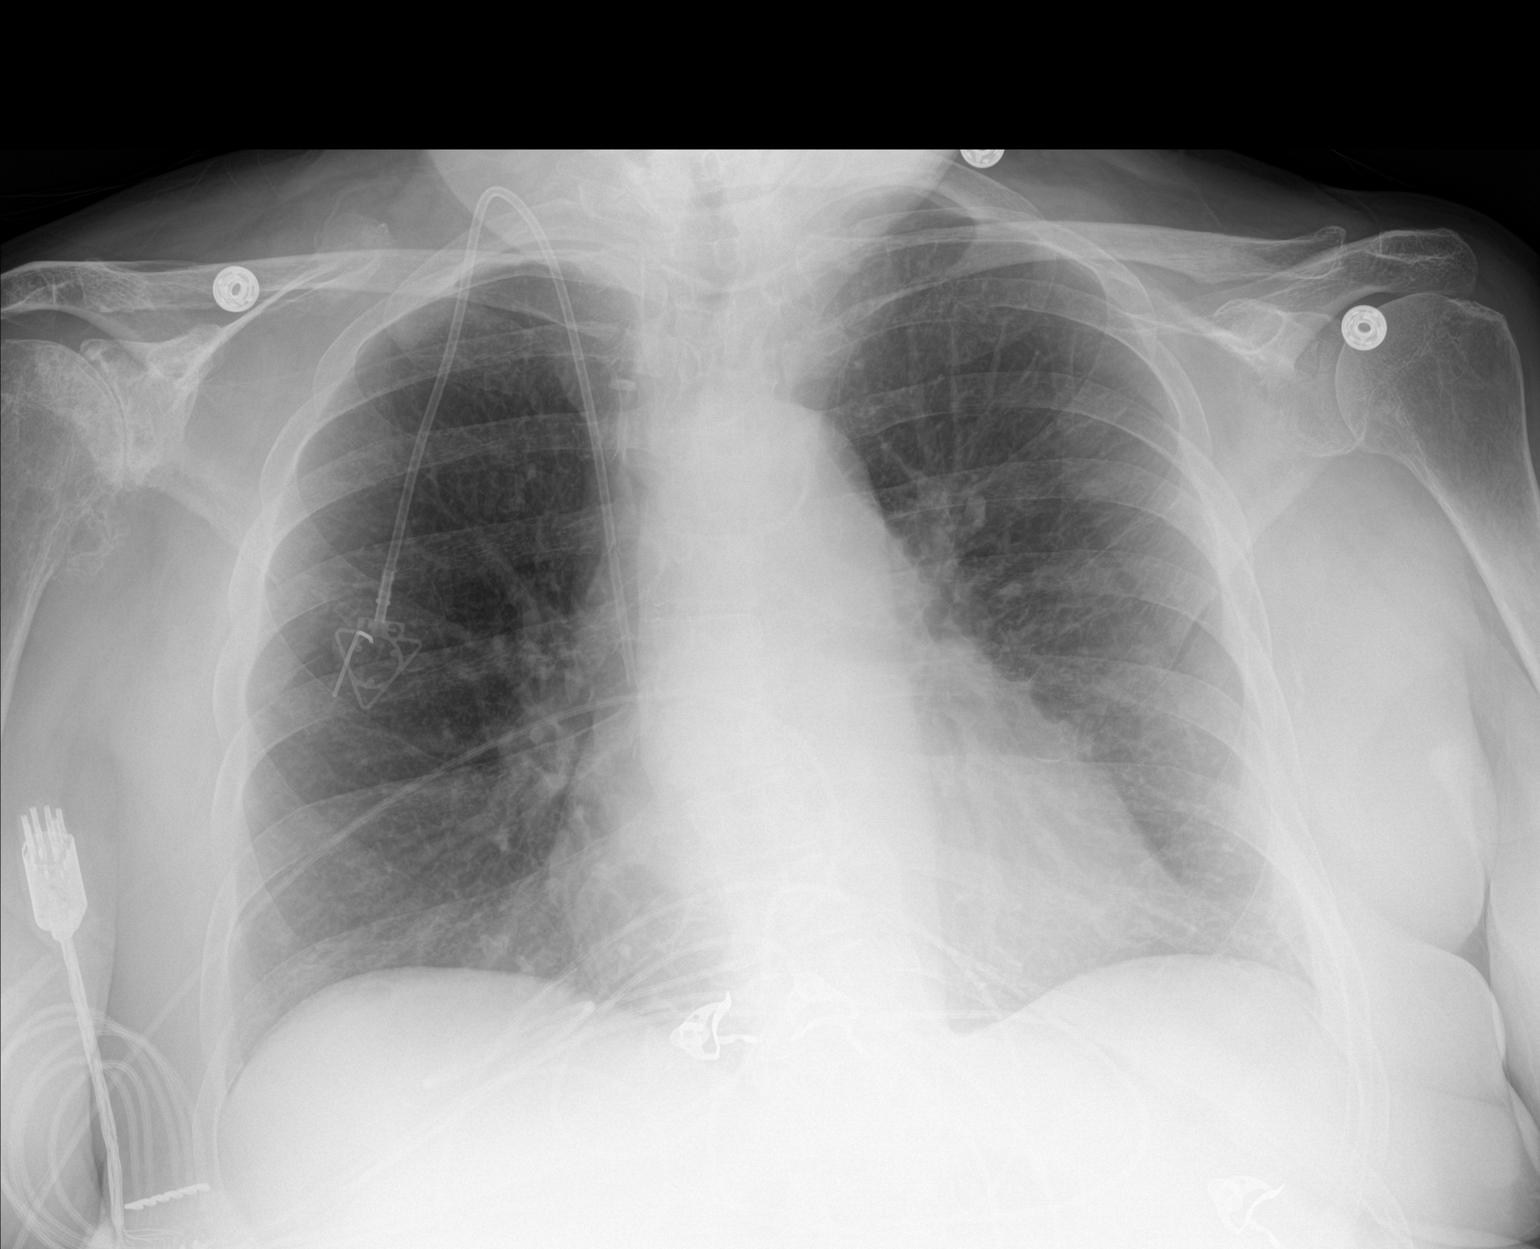

[1 of 1 positions shown; findings below may reference images not displayed]

FINDINGS: Right-sided central venous port tip over the SVC. No focal opacity,
pleural effusion, or pneumothorax. Stable cardiomediastinal
silhouette with aortic atherosclerosis.
IMPRESSION: No radiographic evidence for acute cardiopulmonary abnormality.

## 2022-05-20 ENCOUNTER — Encounter: Payer: Self-pay | Admitting: *Deleted

## 2022-05-20 NOTE — Progress Notes (Signed)
Received a call from patient's friend/caregiver, [name]. Patient continues to be in poor health. She is able to get up and go to the bathroom, but otherwise is bedridden. She is barely eating/drinking and states she is extremely weak. [name] states that the patient says she can't come into the office tomorrow because it's a morning appointment and it will be raining. She's been holding her chemo since the 13th, however has no made it into the office for lab work since it's initiation several weeks ago.   Recommended that [name] get patient to the office tomorrow for physical and blood work assessment, or call EMS to have patient go to the ED for workup. She states patient refuses either option. We spoke about how [name] cannot continue to care for patient in this state, and that she may need to use tough love as a motivator to have patient be assessed. She agrees that she cannot continue to provide this level of care. We were able to have tomorrow's appointment rescheduled from morning to afternoon to help with patient's normal routine. She will work on having patient come into the office tomorrow.   She will call me back tomorrow if she is unsuccessful.   Oncology Nurse Navigator Documentation     05/20/2022    1:15 PM  Oncology Nurse Navigator Flowsheets  Navigator Follow Up Date: 05/21/2022  Navigator Follow Up Reason: Follow-up Appointment  Navigator Location [facility]  Navigator Encounter Type Telephone  Telephone Symptom Mgt;Patient Update;Incoming Call  Patient Visit Type MedOnc  Treatment Phase Active Tx  Barriers/Navigation Needs Coordination of Care;Education  Education Pain/ Symptom Management;Other  Interventions Coordination of Care;Education;Surgical Clearance  Acuity Level 2-Minimal Needs (1-2 Barriers Identified)  Coordination of Care Appts  Education Method Verbal  Support Groups/Services Friends and Family  Time Spent with Patient 80

## 2022-05-21 ENCOUNTER — Other Ambulatory Visit: Payer: Self-pay

## 2022-05-21 ENCOUNTER — Inpatient Hospital Stay: Payer: Medicare PPO

## 2022-05-21 ENCOUNTER — Inpatient Hospital Stay: Payer: Medicare PPO | Admitting: Family

## 2022-05-21 ENCOUNTER — Encounter: Payer: Self-pay | Admitting: *Deleted

## 2022-05-21 ENCOUNTER — Inpatient Hospital Stay (HOSPITAL_BASED_OUTPATIENT_CLINIC_OR_DEPARTMENT_OTHER): Payer: Medicare PPO | Admitting: Hematology & Oncology

## 2022-05-21 ENCOUNTER — Encounter: Payer: Self-pay | Admitting: Hematology & Oncology

## 2022-05-21 VITALS — BP 101/57 | HR 122 | Temp 98.4°F | Resp 18 | Ht 64.0 in | Wt 134.0 lb

## 2022-05-21 DIAGNOSIS — C787 Secondary malignant neoplasm of liver and intrahepatic bile duct: Secondary | ICD-10-CM | POA: Diagnosis not present

## 2022-05-21 DIAGNOSIS — C7951 Secondary malignant neoplasm of bone: Secondary | ICD-10-CM

## 2022-05-21 DIAGNOSIS — Z66 Do not resuscitate: Secondary | ICD-10-CM | POA: Diagnosis not present

## 2022-05-21 DIAGNOSIS — D509 Iron deficiency anemia, unspecified: Secondary | ICD-10-CM | POA: Diagnosis not present

## 2022-05-21 DIAGNOSIS — D649 Anemia, unspecified: Secondary | ICD-10-CM

## 2022-05-21 DIAGNOSIS — E860 Dehydration: Secondary | ICD-10-CM | POA: Diagnosis not present

## 2022-05-21 DIAGNOSIS — C679 Malignant neoplasm of bladder, unspecified: Secondary | ICD-10-CM

## 2022-05-21 LAB — CMP (CANCER CENTER ONLY)
ALT: 21 U/L (ref 0–44)
AST: 27 U/L (ref 15–41)
Albumin: 1.9 g/dL — ABNORMAL LOW (ref 3.5–5.0)
Alkaline Phosphatase: 455 U/L — ABNORMAL HIGH (ref 38–126)
Anion gap: 9 (ref 5–15)
BUN: 22 mg/dL (ref 8–23)
CO2: 22 mmol/L (ref 22–32)
Calcium: 6.5 mg/dL — ABNORMAL LOW (ref 8.9–10.3)
Chloride: 101 mmol/L (ref 98–111)
Creatinine: 0.97 mg/dL (ref 0.44–1.00)
GFR, Estimated: >60 mL/min (ref >60)
Glucose, Bld: 109 mg/dL — ABNORMAL HIGH (ref 70–99)
Potassium: 4.2 mmol/L (ref 3.5–5.1)
Sodium: 132 mmol/L — ABNORMAL LOW (ref 135–145)
Total Bilirubin: 0.3 mg/dL (ref 0.3–1.2)
Total Protein: 5.4 g/dL — ABNORMAL LOW (ref 6.5–8.1)

## 2022-05-21 LAB — CBC WITH DIFFERENTIAL (CANCER CENTER ONLY)
Abs Immature Granulocytes: 0.08 10*3/uL — ABNORMAL HIGH (ref 0.00–0.07)
Basophils Absolute: 0 10*3/uL (ref 0.0–0.1)
Basophils Relative: 0 %
Eosinophils Absolute: 0.2 10*3/uL (ref 0.0–0.5)
Eosinophils Relative: 2 %
HCT: 23.5 % — ABNORMAL LOW (ref 36.0–46.0)
Hemoglobin: 7.3 g/dL — ABNORMAL LOW (ref 12.0–15.0)
Immature Granulocytes: 1 %
Lymphocytes Relative: 4 %
Lymphs Abs: 0.4 10*3/uL — ABNORMAL LOW (ref 0.7–4.0)
MCH: 32.4 pg (ref 26.0–34.0)
MCHC: 31.1 g/dL (ref 30.0–36.0)
MCV: 104.4 fL — ABNORMAL HIGH (ref 80.0–100.0)
Monocytes Absolute: 0.7 10*3/uL (ref 0.1–1.0)
Monocytes Relative: 7 %
Neutro Abs: 7.8 10*3/uL — ABNORMAL HIGH (ref 1.7–7.7)
Neutrophils Relative %: 86 %
Platelet Count: 430 10*3/uL — ABNORMAL HIGH (ref 150–400)
RBC: 2.25 MIL/uL — ABNORMAL LOW (ref 3.87–5.11)
RDW: 17.2 % — ABNORMAL HIGH (ref 11.5–15.5)
WBC Count: 9.1 10*3/uL (ref 4.0–10.5)
nRBC: 0 % (ref 0.0–0.2)

## 2022-05-21 LAB — PREPARE RBC (CROSSMATCH)

## 2022-05-21 LAB — SAMPLE TO BLOOD BANK

## 2022-05-21 LAB — MAGNESIUM: Magnesium: 1.4 mg/dL — ABNORMAL LOW (ref 1.7–2.4)

## 2022-05-21 LAB — PHOSPHORUS: Phosphorus: 4.7 mg/dL — ABNORMAL HIGH (ref 2.5–4.6)

## 2022-05-21 LAB — LACTATE DEHYDROGENASE: LDH: 195 U/L — ABNORMAL HIGH (ref 98–192)

## 2022-05-21 MED ORDER — OXYCODONE HCL 20 MG/ML PO CONC: 10.0000 mg | ORAL | 0 refills | Status: AC | PRN

## 2022-05-21 MED ORDER — HEPARIN SOD (PORK) LOCK FLUSH 100 UNIT/ML IV SOLN
500.0000 [IU] | Freq: Once | INTRAVENOUS | Status: AC
  Administered 2022-05-21: 500 [IU] via INTRAVENOUS

## 2022-05-21 MED ORDER — FENTANYL 25 MCG/HR TD PT72: 1.0000 | MEDICATED_PATCH | TRANSDERMAL | 0 refills | Status: AC

## 2022-05-21 MED ORDER — SODIUM CHLORIDE 0.9% FLUSH
10.0000 mL | Freq: Once | INTRAVENOUS | Status: AC
  Administered 2022-05-21: 10 mL via INTRAVENOUS

## 2022-05-21 NOTE — Progress Notes (Signed)
Hematology and Oncology Follow Up Visit  Cheryl Singh [id number] 04-30-1951 71 y.o. 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  Unfortunately, we clearly have progressive disease now.  She is weaker.  She is in a wheelchair.  She really cannot walk.  I had a very long talk with Cheryl today.  Cheryl Singh was with [place].  I told Cheryl Singh that is hard as she is trying, she just does not able to fight this cancer any longer.  The cancer is progressing.  I think it is obvious from Cheryl labs and from Cheryl parents that she is progressing.  At this point, we are going to stop the erdatifinb.  I do still think that this is helping.  I really would like for Cheryl to have respect comfort and dignity.  I think the best way for [place] to do that is to get Hospice involved.  I explained to Cheryl Singh what Hospice does.  Cheryl Singh.  I think this is definitely possible.  We really need to improve Cheryl pain medication.  She is not on a Duragesic patch.  I will try to get Cheryl on a Duragesic patch.  We will also try to get Cheryl on Oxyfast for short-term pain control.  I told Cheryl that Hospice can keep Cheryl at [facility].  If there are any issues with Cheryl being at [facility], and Hospice has a facility that she can be cared for.  I then talked to Cheryl Singh about end-of-life issues.  She DOES NOT want to know how long I thought she  had.  I did tell Cheryl Singh in private that I thought that she would not make it to Christmas.  Cheryl Singh does NOT want to be kept alive on Cheryl machine.  I totally agree with this.  I think that if she were to be put on life support, she would never come off it as Cheryl body is so weak.  I do not think she would want to exist.  She would not have Cheryl family turn the machine off.  She DOES NOT want to have this happen.  As such, she is a DO NOT RESUSCITATE.  I know that Cheryl Singh has tried Cheryl best.  She just has a very aggressive disease.  Who presented aggressively by attacking Cheryl bones.  She had a lot of pain when we first saw Cheryl.  She was on radiation therapy which helped a little bit.  I told Cheryl that with Cheryl albumin of only 1.9, that I suspect it is Cheryl cancer that is utilizing Cheryl nutrients now.  I told Cheryl that this is what cancer does in the end as he can continue to use to progress.  Again, I just want Cheryl to be comfortable.  Cheryl hemoglobin is only 7.3.  I am  sure this will be even lower as Cheryl Singh appears dehydrated.  I think we have to transfuse Cheryl.  This will at least make Cheryl feel better for the holidays.  I know this was incredibly hard for Cheryl Singh.  She has was been so strong.  Again, she is done everything that we have asked Cheryl to do.  She has been such an inspiration to all of [place].  At the present time, I would have to say that Cheryl performance status is probably ECOG 3-4.    Medications:  Allergies as of 05/21/2022   No Known Allergies      Medication List        Accurate as of May 21, 2022  4:10 PM. If you have any questions, ask your nurse or doctor.          atorvastatin 10 MG tablet Commonly known as: LIPITOR Take 10 mg by mouth every evening.   Balversa 4 MG Tabs Generic drug: Erdafitinib Take 2 tablets (8 mg) by mouth daily after breakfast.   celecoxib 200 MG capsule Commonly known as: CeleBREX Take 1 capsule (200 mg  total) by mouth 2 (two) times daily.   diltiazem 30 MG tablet Commonly known as: CARDIZEM TAKE 2 TABLETS (60 MG TOTAL) BY MOUTH 3 (THREE) TIMES DAILY.   famotidine 40 MG tablet Commonly known as: PEPCID Take 1 tablet (40 mg total) by mouth 2 (two) times daily.   Gas Relief 250 MG Caps Generic drug: Simethicone Take 1 capsule by mouth daily as needed (for flatulence).   LORazepam 0.5 MG tablet Commonly known as: ATIVAN Take 1 tablet (0.5 mg total) by mouth every 8 (eight) hours as needed for anxiety.   multivitamin with minerals tablet Take 1 tablet by mouth daily.   oxyCODONE-acetaminophen 5-325 MG tablet Commonly known as: PERCOCET/ROXICET Take 1-2 tablets by mouth every 4 (four) hours as needed for severe pain.   senna-docusate 8.6-50 MG tablet Commonly known as: Senokot-S Take 2 tablets by mouth 2 (two) times daily.        Allergies: No Known Allergies  Past Medical History, Surgical history, Social history, and Family History were reviewed and updated.  Review of Systems: Review of Systems  Constitutional:  Positive for malaise/fatigue. Negative for fever.  HENT: Negative.    Eyes: Negative.   Respiratory: Negative.    Cardiovascular: Negative.   Gastrointestinal: Negative.   Genitourinary: Negative.   Musculoskeletal: Negative.   Skin: Negative.   Neurological: Negative.   Endo/Heme/Allergies: Negative.   Psychiatric/Behavioral: Negative.       Physical Exam:  height is _0  (1.626 m) and weight is 134 lb (60.8 kg). Cheryl oral temperature is 98.4 F (36.9 C). Cheryl blood pressure is 101/57 (abnormal) and Cheryl pulse is 122 (abnormal). Cheryl respiration is 18 and oxygen saturation is 96%.   Wt Readings from Last 3 Encounters:  05/21/22 134 lb (60.8 kg)  04/09/22 136 lb 4.8 oz (61.8 kg)  03/19/22 137 lb 9.6 oz (62.4 kg)    Physical Exam Vitals reviewed.  Constitutional:      Comments: This is a chronically ill-appearing white female.  She is quite thin.   She is in a wheelchair.  She is quite feeble.  She is alert and oriented.  HENT:     Head: Normocephalic and atraumatic.  Eyes:     Pupils: Pupils are equal, round, and reactive to light.  Cardiovascular:     Rate and Rhythm: Normal rate and regular rhythm.  Heart sounds: Normal heart sounds.  Pulmonary:     Effort: Pulmonary effort is normal.     Breath sounds: Normal breath sounds.  Abdominal:     General: Bowel sounds are normal.     Palpations: Abdomen is soft.  Musculoskeletal:        General: No tenderness or deformity. Normal range of motion.     Cervical back: Normal range of motion.  Lymphadenopathy:     Cervical: No cervical adenopathy.  Skin:    General: Skin is warm and dry.     Findings: No erythema or rash.  Neurological:     Mental Status: She is alert and oriented to person, place, and time.  Psychiatric:        Behavior: Behavior normal.        Thought Content: Thought content normal.        Judgment: Judgment normal.      Lab Results  Component Value Date   WBC 9.1 05/21/2022   HGB 7.3 (L) 05/21/2022   HCT 23.5 (L) 05/21/2022   MCV 104.4 (H) 05/21/2022   PLT 430 (H) 05/21/2022   Lab Results  Component Value Date   FERRITIN 992 (H) 02/26/2022   IRON 137 02/26/2022   TIBC 161 (L) 02/26/2022   UIBC 24 (L) 02/26/2022   IRONPCTSAT 85 (H) 02/26/2022   Lab Results  Component Value Date   RETICCTPCT 2.6 02/05/2022   RBC 2.25 (L) 05/21/2022   No results found for: "KPAFRELGTCHN", "LAMBDASER", "KAPLAMBRATIO" No results found for: "IGGSERUM", "IGA", "IGMSERUM" No results found for: "TOTALPROTELP", "ALBUMINELP", "A1GS", "A2GS", "BETS", "BETA2SER", "GAMS", "MSPIKE", "SPEI"   Chemistry      Component Value Date/Time   NA 132 (L) 05/21/2022 1426   K 4.2 05/21/2022 1426   CL 101 05/21/2022 1426   CO2 22 05/21/2022 1426   BUN 22 05/21/2022 1426   CREATININE 0.97 05/21/2022 1426      Component Value Date/Time   CALCIUM 6.5 (L) 05/21/2022 1426    ALKPHOS 455 (H) 05/21/2022 1426   AST 27 05/21/2022 1426   ALT 21 05/21/2022 1426   BILITOT 0.3 05/21/2022 1426       Impression and Plan: Cheryl Singh is a very pleasant 72 yo caucasian female with metastatic bladder cancer. She had completed 4 cycles of chemo and required some dosage adjustments and blood transfusions due to colitis.   She has been on Tecentriq.  Unfortunately, this did not have much of a response.  I think she responded initially and then has had progressive disease.  We tried Cheryl on Erdatifinib [facility]).  Unfortunate, this really did not help [place] out.  Again, we have to think about Hospice for Cheryl now.  I think this would be the best way to help take care of Cheryl.  I really think that she has less than a month to go.  I does want Cheryl to be comfortable.  I will call in a Duragesic patch for Cheryl.  I will also send in some Oxyfast for Cheryl.  We will have Cheryl come back for blood tomorrow.  I think this will make Cheryl feel little bit better for Thanksgiving.  I want Cheryl to be able to enjoy things given.  I think that Hospice will take over from here.  Will make sure that Hospice calls Cheryl friend, Singh, and then they will be able to come out and talk to Cheryl Singh as to how they can help Cheryl.  Volanda Napoleon, MD 11/21/20234:10 PM

## 2022-05-21 NOTE — Patient Instructions (Signed)

## 2022-05-21 NOTE — Progress Notes (Unsigned)
Patient is here for follow up. She comes in a wheelchair and feels very weak. She does appear pale and frail. [name] accompanies her to this appointment.   Due to disease progression and patient's status, Dr Marin Olp recommends Hospice. Patient will be transfused tomorrow and then referred to Hospice.   Oncology Nurse Navigator Documentation     05/21/2022    3:00 PM  Oncology Nurse Navigator Flowsheets  Navigation Complete Date: 05/21/2022  Post Navigation: Continue to Follow Patient? No  Reason Not Navigating Patient: Airline pilot Encounter Type Treatment  Patient Visit Type MedOnc  Treatment Phase Other  Barriers/Navigation Needs Coordination of Care;Education  Interventions Psycho-Social Support  Acuity Level 2-Minimal Needs (1-2 Barriers Identified)  Support Groups/Services Friends and Family  Time Spent with Patient 15

## 2022-05-22 ENCOUNTER — Inpatient Hospital Stay: Payer: Medicare PPO

## 2022-05-22 ENCOUNTER — Telehealth: Payer: Self-pay | Admitting: *Deleted

## 2022-05-22 ENCOUNTER — Encounter: Payer: Self-pay | Admitting: Hematology & Oncology

## 2022-05-22 ENCOUNTER — Inpatient Hospital Stay: Payer: Medicare PPO | Admitting: Licensed Clinical Social Worker

## 2022-05-22 DIAGNOSIS — C787 Secondary malignant neoplasm of liver and intrahepatic bile duct: Secondary | ICD-10-CM | POA: Diagnosis not present

## 2022-05-22 DIAGNOSIS — E860 Dehydration: Secondary | ICD-10-CM | POA: Diagnosis not present

## 2022-05-22 DIAGNOSIS — C679 Malignant neoplasm of bladder, unspecified: Secondary | ICD-10-CM | POA: Diagnosis not present

## 2022-05-22 DIAGNOSIS — C7951 Secondary malignant neoplasm of bone: Secondary | ICD-10-CM | POA: Diagnosis not present

## 2022-05-22 DIAGNOSIS — D649 Anemia, unspecified: Secondary | ICD-10-CM

## 2022-05-22 DIAGNOSIS — Z66 Do not resuscitate: Secondary | ICD-10-CM | POA: Diagnosis not present

## 2022-05-22 DIAGNOSIS — D509 Iron deficiency anemia, unspecified: Secondary | ICD-10-CM | POA: Diagnosis not present

## 2022-05-22 MED ORDER — SODIUM CHLORIDE 0.9% FLUSH
10.0000 mL | INTRAVENOUS | Status: AC | PRN
  Administered 2022-05-22: 10 mL

## 2022-05-22 MED ORDER — ACETAMINOPHEN 325 MG PO TABS: 650.0000 mg | ORAL_TABLET | Freq: Once | ORAL | Status: DC

## 2022-05-22 MED ORDER — HEPARIN SOD (PORK) LOCK FLUSH 100 UNIT/ML IV SOLN
500.0000 [IU] | Freq: Every day | INTRAVENOUS | Status: AC | PRN
  Administered 2022-05-22: 500 [IU]

## 2022-05-22 MED ORDER — FUROSEMIDE 10 MG/ML IJ SOLN: 20.0000 mg | Freq: Once | INTRAMUSCULAR | Status: DC

## 2022-05-22 MED ORDER — SODIUM CHLORIDE 0.9% IV SOLUTION
250.0000 mL | Freq: Once | INTRAVENOUS | Status: AC
  Administered 2022-05-22: 250 mL via INTRAVENOUS

## 2022-05-22 MED ORDER — DIPHENHYDRAMINE HCL 25 MG PO CAPS: 25.0000 mg | ORAL_CAPSULE | Freq: Once | ORAL | Status: DC

## 2022-05-22 NOTE — Telephone Encounter (Signed)
Referral sent to [facility] for hospice services per Dr Marin Olp request.  Verbal order given.

## 2022-05-22 NOTE — Progress Notes (Signed)
[facility] [place]  Clinical Social Work was referred by nurse for assessment of psychosocial needs.  Clinical Social Worker met with patient  to offer support and assess for needs.    Patient stated she is discontinuing chemotherapy and will be admitted to Hospice.  CSW provided education regarding possible services that will be provided.  Cheryl Singh stated she has an excellent support system.  She lives in a duplex where the other resident, Cheryl Singh, is a friend and assists Cheryl Singh with her ADLs.  She reports being able to get all of her needs met.  CSW provided supportive counseling.    Margaree Mackintosh, LCSW  Clinical Social Worker [facility]

## 2022-05-22 NOTE — Patient Instructions (Signed)
Blood Transfusion, Adult, Care After The following information offers guidance on how to care for yourself after your procedure. Your health care provider may also give you more specific instructions. If you have problems or questions, contact your health care provider. What can I expect after the procedure? After the procedure, it is common to have: Bruising and soreness where the IV was inserted. A headache. Follow these instructions at home: IV insertion site care     Follow instructions from your health care provider about how to take care of your IV insertion site. Make sure you: Wash your hands with soap and water for at least 20 seconds before and after you change your bandage (dressing). If soap and water are not available, use hand sanitizer. Change your dressing as told by your health care provider. Check your IV insertion site every day for signs of infection. Check for: Redness, swelling, or pain. Bleeding from the site. Warmth. Pus or a bad smell. General instructions Take over-the-counter and prescription medicines only as told by your health care provider. Rest as told by your health care provider. Return to your normal activities as told by your health care provider. Keep all follow-up visits. Lab tests may need to be done at certain periods to recheck your blood counts. Contact a health care provider if: You have itching or red, swollen areas of skin (hives). You have a fever or chills. You have pain in the head, back, or chest. You feel anxious or you feel weak after doing your normal activities. You have redness, swelling, warmth, or pain around the IV insertion site. You have blood coming from the IV insertion site that does not stop with pressure. You have pus or a bad smell coming from your IV insertion site. If you received your blood transfusion in an outpatient setting, you will be told whom to contact to report any reactions. Get help right away if: You  have symptoms of a serious allergic or immune system reaction, including: Trouble breathing or shortness of breath. Swelling of the face, feeling flushed, or widespread rash. Dark urine or blood in the urine. Fast heartbeat. These symptoms may be an emergency. Get help right away. Call 911. Do not wait to see if the symptoms will go away. Do not drive yourself to the hospital. Summary Bruising and soreness around the IV insertion site are common. Check your IV insertion site every day for signs of infection. Rest as told by your health care provider. Return to your normal activities as told by your health care provider. Get help right away for symptoms of a serious allergic or immune system reaction to the blood transfusion. This information is not intended to replace advice given to you by your health care provider. Make sure you discuss any questions you have with your health care provider. Document Revised: 09/14/2021 Document Reviewed: 09/14/2021 Elsevier Patient Education  2023 [employer]  

## 2022-05-23 LAB — BPAM RBC
Blood Product Expiration Date: 202312152359
Blood Product Expiration Date: 202312152359
ISSUE DATE / TIME: 202311220812
ISSUE DATE / TIME: 202311220812
Unit Type and Rh: 6200
Unit Type and Rh: 6200

## 2022-05-23 LAB — TYPE AND SCREEN
ABO/RH(D): AB POS
Antibody Screen: NEGATIVE
Unit division: 0
Unit division: 0

## 2022-05-29 ENCOUNTER — Other Ambulatory Visit (HOSPITAL_COMMUNITY): Payer: Self-pay

## 2022-06-05 ENCOUNTER — Other Ambulatory Visit (HOSPITAL_COMMUNITY): Payer: Self-pay

## 2022-06-07 ENCOUNTER — Other Ambulatory Visit (HOSPITAL_COMMUNITY): Payer: Self-pay

## 2022-06-13 ENCOUNTER — Other Ambulatory Visit (HOSPITAL_COMMUNITY): Payer: Self-pay

## 2022-06-14 ENCOUNTER — Ambulatory Visit: Payer: Medicare PPO | Admitting: Family

## 2022-06-14 ENCOUNTER — Other Ambulatory Visit: Payer: Medicare PPO

## 2022-06-19 ENCOUNTER — Other Ambulatory Visit (HOSPITAL_COMMUNITY): Payer: Self-pay

## 2022-07-01 DEATH — deceased

## 2022-07-11 IMAGING — CT CT ABD-PELV W/ CM
2 of 5 series · 15 of 46 positions shown, 17 images · IV contrast (APPLIED)
Comparison: PET-CT from 11/09/2021

CLINICAL DATA: Acute abdominal pain on the left

EXAM:
CT ABDOMEN AND PELVIS WITH CONTRAST
TECHNIQUE: Multidetector CT imaging of the abdomen and pelvis was performed
using the standard protocol following bolus administration of
intravenous contrast.

[Series 2: axial st · axial · 0.84mm/px · z∈[+1054,+1404]mm · 12 of 82 slices shown, 14 images]
[im 6/82  soft-tissue]
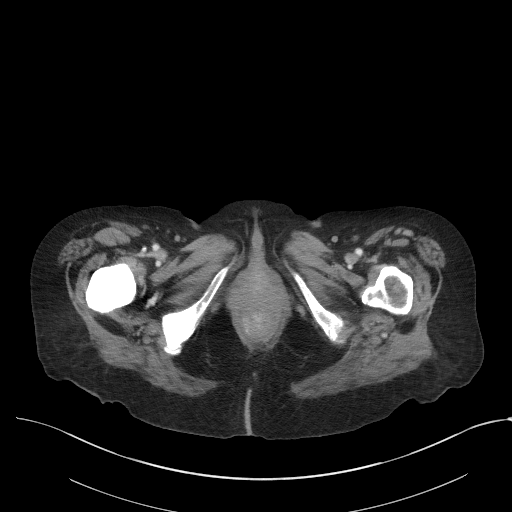
[im 6/82  bone]
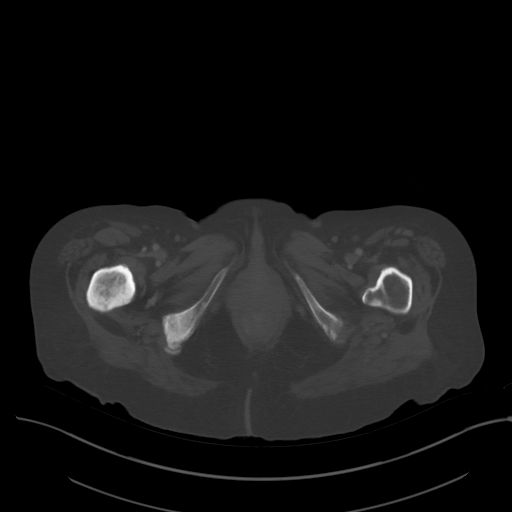
[im 11/82  soft-tissue]
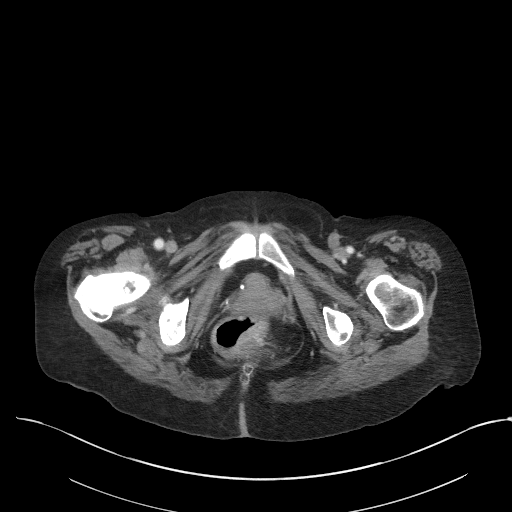
[im 17/82  soft-tissue]
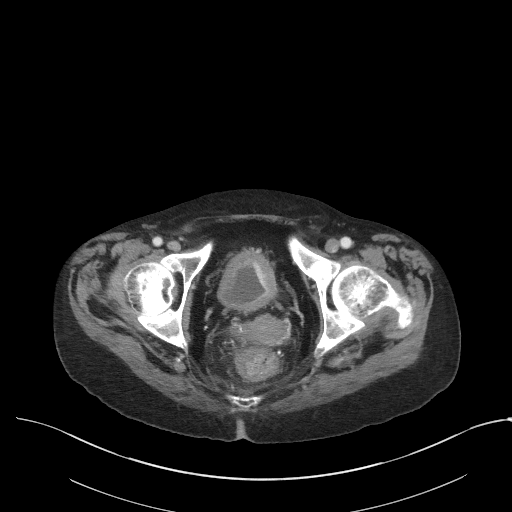
[im 28/82  soft-tissue]
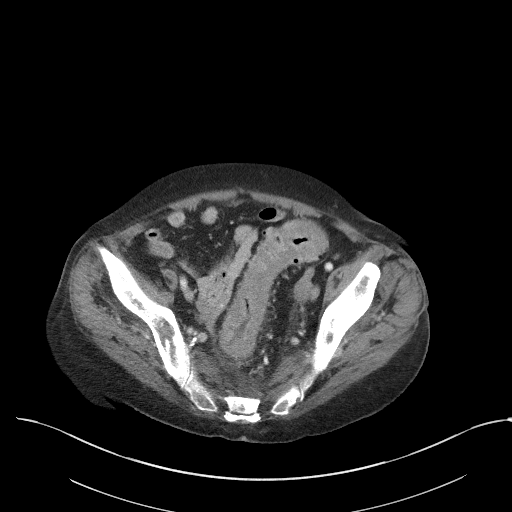
[im 33/82  soft-tissue]
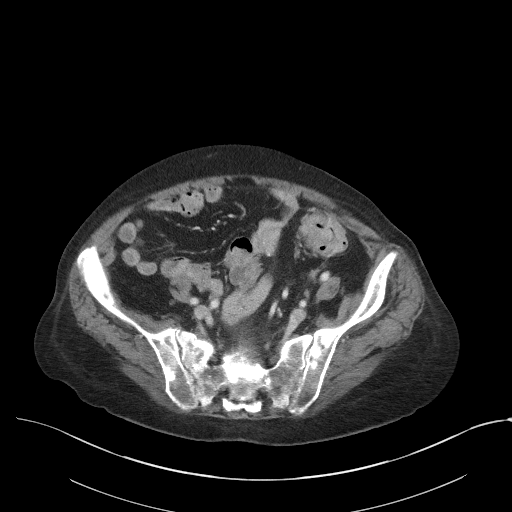
[im 38/82  soft-tissue]
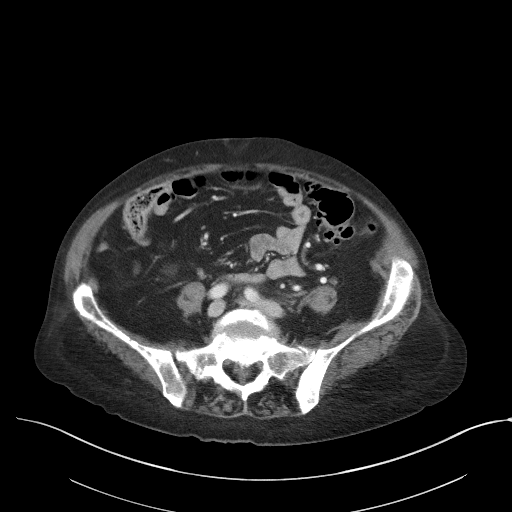
[im 44/82  soft-tissue]
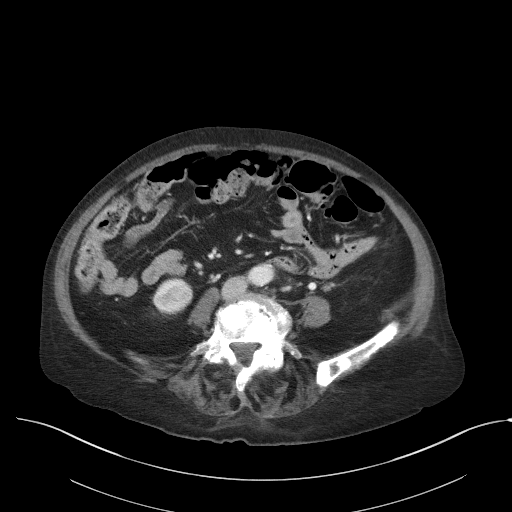
[im 49/82  soft-tissue]
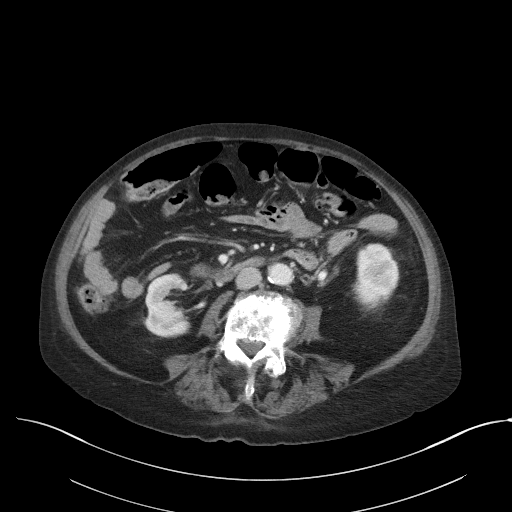
[im 55/82  soft-tissue]
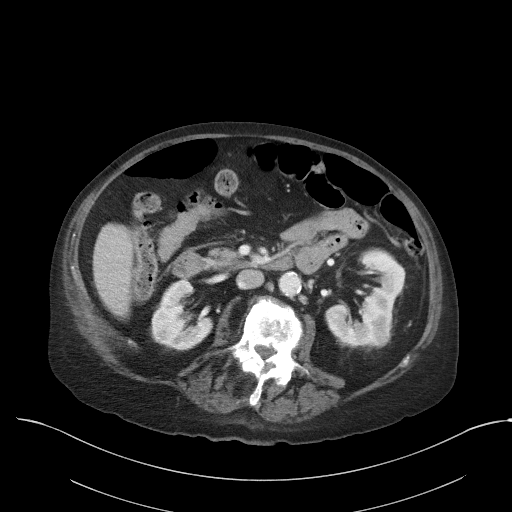
[im 55/82  bone]
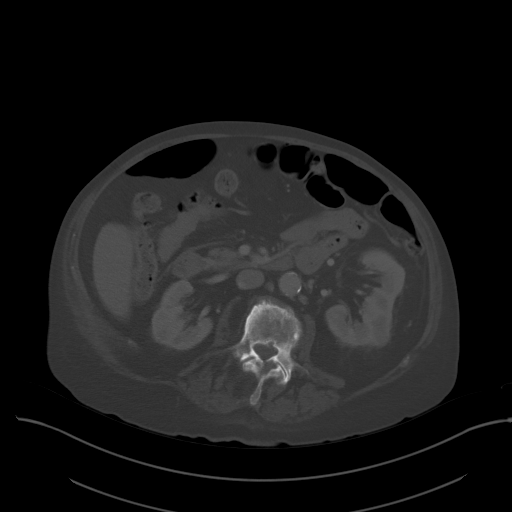
[im 65/82  soft-tissue]
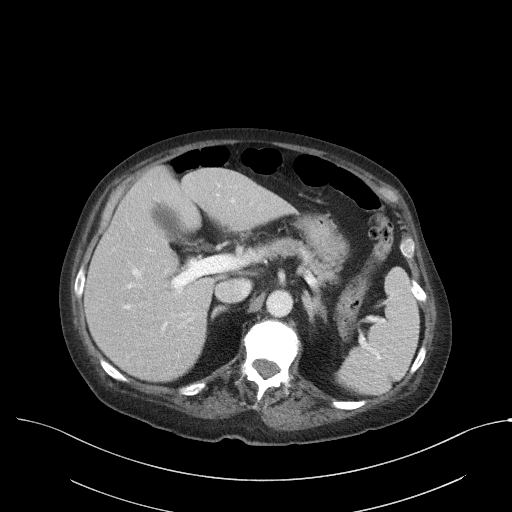
[im 71/82  soft-tissue]
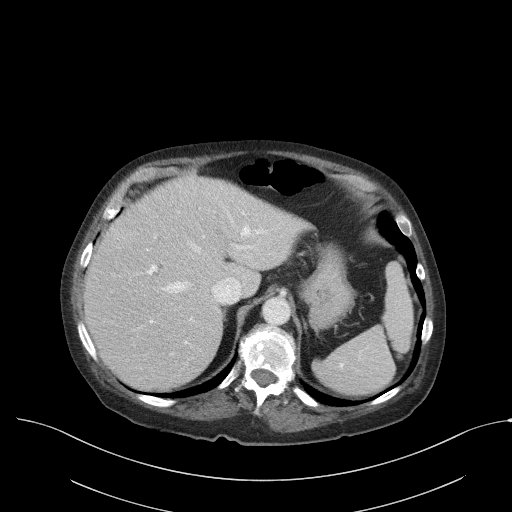
[im 76/82  soft-tissue]
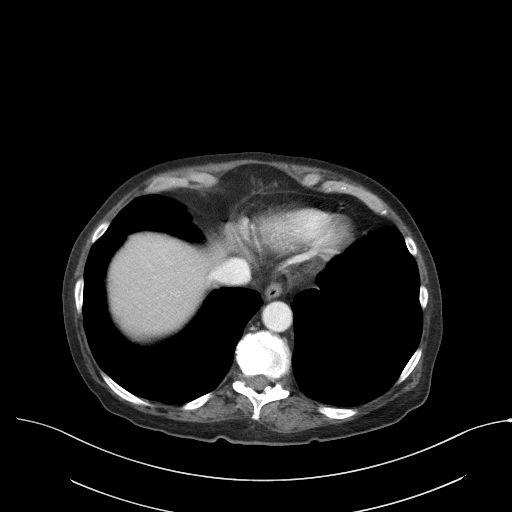

[Series 4: coronal st · coronal · 0.68mm/px · 3 of 91 slices shown]
[im 31/91  soft-tissue]
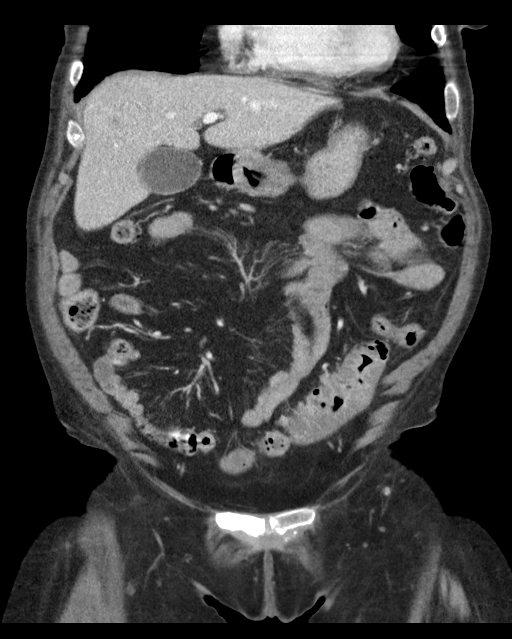
[im 41/91  soft-tissue]
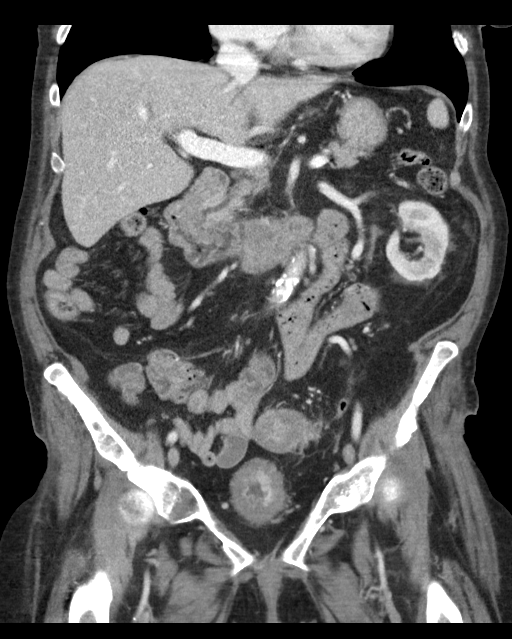
[im 51/91  soft-tissue]
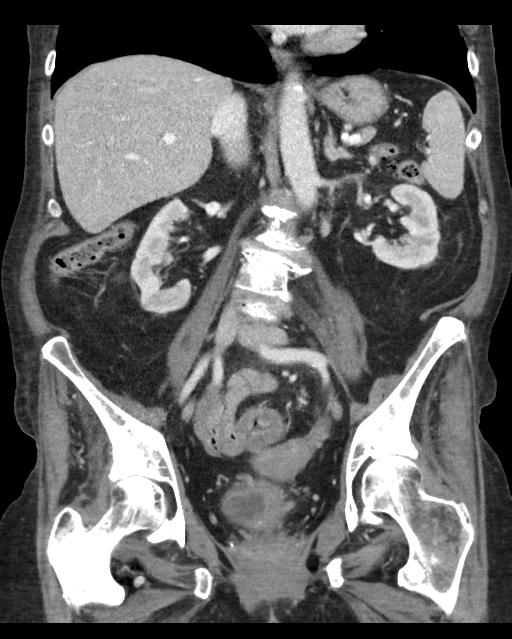

[15 of 46 positions shown; findings below may reference images not displayed]

RADIATION DOSE REDUCTION: This exam was performed according to the
departmental dose-optimization program which includes automated
exposure control, adjustment of the mA and/or kV according to
patient size and/or use of iterative reconstruction technique.

CONTRAST:  100mL OMNIPAQUE IOHEXOL 300 MG/ML  SOLN
FINDINGS: Lower chest: Lungs are well aerated bilaterally. No focal infiltrate
or sizable effusion is seen.

Hepatobiliary: Liver demonstrates hypodensity best seen on image
number 14 of series 2 similar to that seen on recent PET-CT
consistent with metastatic disease. Gallbladder is within normal
limits.

Pancreas: Unremarkable. No pancreatic ductal dilatation or
surrounding inflammatory changes.

Spleen: Normal in size without focal abnormality.

Adrenals/Urinary Tract: Adrenal glands are unremarkable. Kidneys
demonstrate a normal enhancement pattern bilaterally. No renal
calculi or obstructive changes are seen. Subcentimeter cysts are
noted on the left. No follow-up is recommended. Normal excretion of
contrast is noted on delayed images. The bladder is decompressed
with diffuse wall thickening particularly on the left with areas of
diffuse enhancement consistent with the known history of bladder
carcinoma.

Stomach/Bowel: Rectosigmoid demonstrates significant wall thickening
similar to that seen on the recent PET-CT most consistent with acute
colitis. Scattered diverticular changes noted. The more proximal
colon is within normal limits. The appendix is unremarkable. Small
bowel and stomach are within normal limits.

Vascular/Lymphatic: Aortic atherosclerosis. No enlarged abdominal or
pelvic lymph nodes.

Reproductive: Uterus and bilateral adnexa are unremarkable.

Other: No free fluid is noted.

Musculoskeletal: Multiple sclerotic foci are identified throughout
the pelvis similar to that seen on prior exam consistent with
metastatic disease. Similar changes are noted in the lumbar spine
and visualized ribcage. No compression deformity is noted.
IMPRESSION: Changes consistent with hepatic and skeletal metastatic disease. No
acute fractures are seen. Some bladder wall thickening is noted
particularly on the left consistent with the given clinical history
of bladder carcinoma.

Diffuse thickening in the sigmoid extending towards the rectum
consistent with acute colitis stable from the recent CT. No
perforation or abscess is identified.

## 2023-07-22 ENCOUNTER — Other Ambulatory Visit (HOSPITAL_COMMUNITY): Payer: Self-pay
